# Patient Record
Sex: Female | Born: 1970 | Race: White | Hispanic: No | Marital: Married | Smoking: Never smoker
Health system: Southern US, Community
[De-identification: ages and names within clinical notes are randomized; demographics above are authoritative.]

## PROBLEM LIST (undated history)

## (undated) DIAGNOSIS — G4730 Sleep apnea, unspecified: Secondary | ICD-10-CM

## (undated) DIAGNOSIS — R232 Flushing: Secondary | ICD-10-CM

## (undated) DIAGNOSIS — Z923 Personal history of irradiation: Secondary | ICD-10-CM

## (undated) DIAGNOSIS — F419 Anxiety disorder, unspecified: Secondary | ICD-10-CM

## (undated) DIAGNOSIS — I10 Essential (primary) hypertension: Secondary | ICD-10-CM

## (undated) DIAGNOSIS — F32A Depression, unspecified: Secondary | ICD-10-CM

## (undated) DIAGNOSIS — K219 Gastro-esophageal reflux disease without esophagitis: Secondary | ICD-10-CM

## (undated) DIAGNOSIS — G4733 Obstructive sleep apnea (adult) (pediatric): Secondary | ICD-10-CM

## (undated) DIAGNOSIS — M1990 Unspecified osteoarthritis, unspecified site: Secondary | ICD-10-CM

## (undated) DIAGNOSIS — Z9989 Dependence on other enabling machines and devices: Secondary | ICD-10-CM

## (undated) DIAGNOSIS — C50919 Malignant neoplasm of unspecified site of unspecified female breast: Secondary | ICD-10-CM

## (undated) DIAGNOSIS — F329 Major depressive disorder, single episode, unspecified: Secondary | ICD-10-CM

## (undated) HISTORY — DX: Unspecified osteoarthritis, unspecified site: M19.90

## (undated) HISTORY — DX: Dependence on other enabling machines and devices: Z99.89

## (undated) HISTORY — PX: TONSILLECTOMY AND ADENOIDECTOMY: SHX28

## (undated) HISTORY — PX: CERVICAL BIOPSY  W/ LOOP ELECTRODE EXCISION: SUR135

## (undated) HISTORY — DX: Sleep apnea, unspecified: G47.30

## (undated) HISTORY — PX: UVULECTOMY: SHX2631

## (undated) HISTORY — PX: NASAL SEPTUM SURGERY: SHX37

## (undated) HISTORY — DX: Essential (primary) hypertension: I10

## (undated) HISTORY — DX: Flushing: R23.2

## (undated) HISTORY — DX: Obstructive sleep apnea (adult) (pediatric): G47.33

## (undated) HISTORY — DX: Gastro-esophageal reflux disease without esophagitis: K21.9

## (undated) HISTORY — PX: CHOLECYSTECTOMY: SHX55

## (undated) HISTORY — DX: Major depressive disorder, single episode, unspecified: F32.9

## (undated) HISTORY — DX: Depression, unspecified: F32.A

## (undated) HISTORY — DX: Malignant neoplasm of unspecified site of unspecified female breast: C50.919

## (undated) HISTORY — DX: Anxiety disorder, unspecified: F41.9

## (undated) HISTORY — PX: HERNIA REPAIR: SHX51

## (undated) HISTORY — PX: BREAST SURGERY: SHX581

---

## 2005-05-03 ENCOUNTER — Inpatient Hospital Stay: Payer: Self-pay

## 2007-01-15 ENCOUNTER — Ambulatory Visit: Payer: Self-pay | Admitting: Internal Medicine

## 2010-04-22 ENCOUNTER — Emergency Department: Payer: Self-pay | Admitting: Emergency Medicine

## 2010-04-23 ENCOUNTER — Emergency Department: Payer: Self-pay | Admitting: Unknown Physician Specialty

## 2010-05-23 ENCOUNTER — Emergency Department: Payer: Self-pay | Admitting: Emergency Medicine

## 2010-07-26 ENCOUNTER — Ambulatory Visit: Payer: Self-pay | Admitting: Unknown Physician Specialty

## 2010-07-26 DIAGNOSIS — I10 Essential (primary) hypertension: Secondary | ICD-10-CM

## 2010-07-29 ENCOUNTER — Ambulatory Visit: Payer: Self-pay | Admitting: Unknown Physician Specialty

## 2011-10-04 ENCOUNTER — Observation Stay: Payer: Self-pay | Admitting: Obstetrics and Gynecology

## 2011-10-11 ENCOUNTER — Observation Stay: Payer: Self-pay

## 2011-10-15 ENCOUNTER — Inpatient Hospital Stay: Payer: Self-pay | Admitting: Advanced Practice Midwife

## 2011-10-15 LAB — CBC WITH DIFFERENTIAL/PLATELET
Basophil #: 0.1 10*3/uL (ref 0.0–0.1)
Basophil %: 0.6 %
Eosinophil %: 1.7 %
HGB: 11.6 g/dL — ABNORMAL LOW (ref 12.0–16.0)
Lymphocyte #: 2.5 10*3/uL (ref 1.0–3.6)
Lymphocyte %: 21 %
MCH: 30.3 pg (ref 26.0–34.0)
MCV: 91 fL (ref 80–100)
Monocyte #: 0.9 x10 3/mm (ref 0.2–0.9)
Neutrophil #: 8.2 10*3/uL — ABNORMAL HIGH (ref 1.4–6.5)
RBC: 3.84 10*6/uL (ref 3.80–5.20)
RDW: 15.2 % — ABNORMAL HIGH (ref 11.5–14.5)

## 2012-04-26 ENCOUNTER — Other Ambulatory Visit: Payer: Self-pay | Admitting: Internal Medicine

## 2012-04-27 NOTE — Telephone Encounter (Signed)
She has not scheduled an appt with me.  Will need to sent request for refill to [facility] clinic.

## 2012-05-02 NOTE — Addendum Note (Signed)
Addended by: Marlene Lard on: 05/02/2012 05:17 PM   Modules accepted: Orders

## 2012-05-02 NOTE — Telephone Encounter (Signed)
Called patient to let her know. Left message on mahine

## 2013-02-18 ENCOUNTER — Ambulatory Visit: Payer: Self-pay | Admitting: Obstetrics and Gynecology

## 2013-03-04 ENCOUNTER — Ambulatory Visit: Payer: Self-pay | Admitting: Family Medicine

## 2013-05-05 ENCOUNTER — Ambulatory Visit: Payer: Self-pay | Admitting: Surgery

## 2013-05-05 LAB — CBC
HCT: 37.9 % (ref 35.0–47.0)
HGB: 12.5 g/dL (ref 12.0–16.0)
MCHC: 33 g/dL (ref 32.0–36.0)
MCV: 84 fL (ref 80–100)
RDW: 14.2 % (ref 11.5–14.5)

## 2013-05-05 LAB — BASIC METABOLIC PANEL
BUN: 15 mg/dL (ref 7–18)
Calcium, Total: 8.9 mg/dL (ref 8.5–10.1)
Chloride: 106 mmol/L (ref 98–107)
Co2: 28 mmol/L (ref 21–32)
Creatinine: 1.03 mg/dL (ref 0.60–1.30)
EGFR (African American): >60
EGFR (Non-African Amer.): >60
Potassium: 4 mmol/L (ref 3.5–5.1)
Sodium: 140 mmol/L (ref 136–145)

## 2013-08-25 ENCOUNTER — Emergency Department: Payer: Self-pay | Admitting: Emergency Medicine

## 2013-09-23 ENCOUNTER — Emergency Department: Payer: Self-pay | Admitting: Emergency Medicine

## 2013-09-23 LAB — CBC
HCT: 40.1 % (ref 35.0–47.0)
HGB: 13 g/dL (ref 12.0–16.0)
MCH: 27.2 pg (ref 26.0–34.0)
MCHC: 32.4 g/dL (ref 32.0–36.0)
MCV: 84 fL (ref 80–100)
Platelet: 279 10*3/uL (ref 150–440)
RBC: 4.77 10*6/uL (ref 3.80–5.20)
RDW: 14.5 % (ref 11.5–14.5)
WBC: 8.7 10*3/uL (ref 3.6–11.0)

## 2013-09-23 LAB — BASIC METABOLIC PANEL
ANION GAP: 5 — AB (ref 7–16)
BUN: 14 mg/dL (ref 7–18)
CO2: 27 mmol/L (ref 21–32)
Calcium, Total: 9.2 mg/dL (ref 8.5–10.1)
Chloride: 107 mmol/L (ref 98–107)
Creatinine: 1.16 mg/dL (ref 0.60–1.30)
EGFR (African American): >60
GFR CALC NON AF AMER: 58 — AB
Glucose: 102 mg/dL — ABNORMAL HIGH (ref 65–99)
Osmolality: 278 (ref 275–301)
Potassium: 3.8 mmol/L (ref 3.5–5.1)
SODIUM: 139 mmol/L (ref 136–145)

## 2013-09-23 LAB — TROPONIN I: Troponin-I: <0.02 ng/mL

## 2014-05-15 DIAGNOSIS — C50919 Malignant neoplasm of unspecified site of unspecified female breast: Secondary | ICD-10-CM

## 2014-05-15 HISTORY — DX: Malignant neoplasm of unspecified site of unspecified female breast: C50.919

## 2014-09-04 NOTE — Op Note (Signed)
PATIENT NAME:  [facility], [facility] MR#:  [id number] DATE OF BIRTH:  [birth year]  DATE OF PROCEDURE:  05/05/2013  PREOPERATIVE DIAGNOSIS: Umbilical hernia.   POSTOPERATIVE DIAGNOSIS: Umbilical hernia.   PROCEDURE: Umbilical hernia repair.   SURGEON: Rochel Brome, M.D.   ANESTHESIA: General.   INDICATION: She reports now some 9 month history of pain in the supraumbilical area. Hurts most every day. On physical examination, was very obese. The abdomen was soft and nontender, but did have a localized area of tenderness deep to the umbilicus. This was also examined with CT scan demonstrating an umbilical hernia, which is approximately a 1 cm defect, and the bulge extended up some 2 to 3 cm, and due to the pain, repair was recommended for definitive treatment.   The patient was placed on the operating table in the supine position under general anesthesia. The abdomen and umbilicus were prepared with ChloraPrep and draped in a sterile manner.   A supraumbilical, transversely-oriented curvilinear incision was made some 4 cm in length, carried down through subcutaneous tissues, and could see that the skin of the umbilicus was approximately 1.8 cm deep and dissected down beyond this finding umbilical hernia sac, which was dissected free from surrounding structures. It was transected deep to the level of the skin, was followed down to the deep fascia, and the fascial ring defect was dissected, so that the sac was dissected away from the fascial ring defect. The sac was inverted. There was good integrity of the fascial ring defect, and the defect itself was approximately 8 mm in dimension. The fascia was somewhat thick. It appeared that it would hold sutures well, and the hernia was repaired with a running 0 Surgilon suture. Next, the wound was inspected. Hemostasis was intact. The fatty tissues were closed with a 3-0 chromic pursestring suture, and then this was attached to the deep portion of the skin of the  umbilicus to maintain its inversion. Next, the skin was closed with running 4-0 Monocryl subcuticular suture and Dermabond. The patient tolerated surgery satisfactorily and was then prepared for transfer to the recovery room.     ____________________________ Lenna Sciara. Rochel Brome, MD jws:dmm D: 05/05/2013 08:28:10 ET T: 05/05/2013 09:59:44 ET JOB#: [id number]  cc: Loreli Dollar, MD, <Dictator> Loreli Dollar MD ELECTRONICALLY SIGNED 05/07/2013 17:54

## 2014-09-22 NOTE — H&P (Signed)
L&D Evaluation:  History Expanded:   HPI 44 yo G3P2002 whose EDC = 10/14/11.  Pt followed at [facility] fore this pregnancy.  Pt presents with contractions    Gravida 3    Term 2    PreTerm 0    Abortion 0    Living 2    [facility] 14-Oct-2011    Presents with contractions    Patient's Medical History Reflux    Patient's Surgical History Colecystectomy  LEEP    Medications Pre Natal Vitamins  Protonics    Allergies NKDA    Social History none   Exam:   General no apparent distress    Mental Status clear    Chest clear    Heart normal sinus rhythm    Abdomen gravid, non-tender    Estimated Fetal Weight ? (obese)    Fetal Position Vtx on ultrasound    Pelvic 1/90/-?    FHT normal rate with no decels   Impression:   Impression Will observe for now, allow to go home if there is no change   Electronic Signatures: Rosina Lowenstein (MD)  (Signed 22-May-13 02:05)  Authored: L&D Evaluation   Last Updated: 22-May-13 02:05 by Rosina Lowenstein (MD)

## 2014-09-22 NOTE — H&P (Signed)
L&D Evaluation:  History Expanded:   HPI 44 yo G3P2002 whose EDC = 10/14/11. Presents for IOL at 40 weeks for BMI 41. PNC at [facility] notable for early entry to care, h/o LEEP with normal cervical length [place],  AMA with negative 1st trimester screening and AFP, Obesity with BMI 41 with normal AFI and growth scans.    Gravida 3    Term 2    PreTerm 0    Abortion 0    Living 2    Blood Type O positive    Group B Strep Results (Result >5wks must be treated as unknown) positive    Maternal HIV Negative    Maternal Syphilis Ab Nonreactive    Maternal Varicella Immune    Rubella Results immune    EDC 14-Oct-2011    Presents with contractions    Patient's Medical History Hypertension  HGSIL    Patient's Surgical History Colecystectomy  LEEP  septoplasty, tonsillectomy    Medications Pre Natal Vitamins  Protonix    Allergies NKDA    Social History none   ROS:   ROS All systems were reviewed.  HEENT, CNS, GI, GU, Respiratory, CV, Renal and Musculoskeletal systems were found to be normal.   Exam:   Vital Signs 132/64 at office on 5/29    General no apparent distress    Mental Status clear    Abdomen gravid, non-tender    Pelvic 1/50/-3 on 5/29    Mebranes Intact    FHT normal rate with no decels   Impression:   Impression IOL for obesity, 40 weeks   Plan:   Plan antibiotics for GBBS prophylaxis    Comments Cervidil Pitocin in am   Electronic Signatures: Ander Purpura (CNM)  (Signed 02-Jun-13 19:55)  Authored: L&D Evaluation   Last Updated: 02-Jun-13 19:55 by Ander Purpura (CNM)

## 2015-01-29 ENCOUNTER — Telehealth: Payer: Self-pay | Admitting: Family Medicine

## 2015-01-29 NOTE — Telephone Encounter (Signed)
Patient was informed and was scheduled for 02/03/15 @ 8:15am.

## 2015-01-29 NOTE — Telephone Encounter (Signed)
Tried to contact this patient to review Dr. Allie Dimmer message but there was answer. A message was left for this patient to give [place] a call when she got the chance.

## 2015-01-29 NOTE — Telephone Encounter (Signed)
Patient returned your call [phone]

## 2015-01-29 NOTE — Telephone Encounter (Signed)
Please communicate with Elizabeth Hoffman that I just spoke with her OB/GYN specialist after her visit with him today (PAP test done today) he has recommended consultation with me as a CPE visit for mammogram order, blood work, and referral to [place] facility to discuss hysterectomy for her ongoing menstrual issues.  Make appointment at her convenience.

## 2015-02-03 ENCOUNTER — Encounter: Payer: Self-pay | Admitting: Family Medicine

## 2015-02-03 ENCOUNTER — Ambulatory Visit (INDEPENDENT_AMBULATORY_CARE_PROVIDER_SITE_OTHER): Payer: Medicaid Other | Admitting: Family Medicine

## 2015-02-03 VITALS — BP 122/78 | HR 89 | Temp 98.7°F | Resp 18 | Ht 70.0 in | Wt 350.6 lb

## 2015-02-03 DIAGNOSIS — Z1231 Encounter for screening mammogram for malignant neoplasm of breast: Secondary | ICD-10-CM | POA: Diagnosis not present

## 2015-02-03 DIAGNOSIS — N921 Excessive and frequent menstruation with irregular cycle: Secondary | ICD-10-CM

## 2015-02-03 DIAGNOSIS — Z13220 Encounter for screening for lipoid disorders: Secondary | ICD-10-CM

## 2015-02-03 DIAGNOSIS — M654 Radial styloid tenosynovitis [de Quervain]: Secondary | ICD-10-CM | POA: Insufficient documentation

## 2015-02-03 DIAGNOSIS — Z136 Encounter for screening for cardiovascular disorders: Secondary | ICD-10-CM

## 2015-02-03 DIAGNOSIS — D229 Melanocytic nevi, unspecified: Secondary | ICD-10-CM | POA: Insufficient documentation

## 2015-02-03 DIAGNOSIS — Z6841 Body Mass Index (BMI) 40.0 and over, adult: Secondary | ICD-10-CM

## 2015-02-03 DIAGNOSIS — I10 Essential (primary) hypertension: Secondary | ICD-10-CM | POA: Diagnosis not present

## 2015-02-03 DIAGNOSIS — K219 Gastro-esophageal reflux disease without esophagitis: Secondary | ICD-10-CM | POA: Insufficient documentation

## 2015-02-03 DIAGNOSIS — F32A Depression, unspecified: Secondary | ICD-10-CM | POA: Insufficient documentation

## 2015-02-03 DIAGNOSIS — Z9889 Other specified postprocedural states: Secondary | ICD-10-CM | POA: Diagnosis not present

## 2015-02-03 DIAGNOSIS — F3342 Major depressive disorder, recurrent, in full remission: Secondary | ICD-10-CM

## 2015-02-03 DIAGNOSIS — R7301 Impaired fasting glucose: Secondary | ICD-10-CM

## 2015-02-03 DIAGNOSIS — R7309 Other abnormal glucose: Secondary | ICD-10-CM | POA: Diagnosis not present

## 2015-02-03 DIAGNOSIS — R7303 Prediabetes: Secondary | ICD-10-CM

## 2015-02-03 DIAGNOSIS — IMO0001 Reserved for inherently not codable concepts without codable children: Secondary | ICD-10-CM

## 2015-02-03 DIAGNOSIS — F329 Major depressive disorder, single episode, unspecified: Secondary | ICD-10-CM | POA: Insufficient documentation

## 2015-02-03 NOTE — Progress Notes (Signed)
Name: Elizabeth Hoffman   MRN: [id number]    DOB: [birth year]   Date:02/03/2015       Progress Note  Subjective  Chief Complaint  Chief Complaint  Patient presents with  . Referral    patient needs a referral for mammogram, and to [facility] in [place]    HPI  Elizabeth Hoffman is a 44 year old female with ongoing concerns regarding heavy and irregular menses not associated with fibroid uterus. She has been working with her gynecologist [facility] Glennon [facility] at [facility] and has decided on proceeding with a hysterectomy however I spoke with Dr. Glennon Hoffman on 01/29/15 after her visit with him that same day (PAP test done) and he has recommended consultation with me for mammogram order, blood work, and referral to [facility] facility to discuss hysterectomy for her ongoing menstrual issues.She reports today she has not heard from his office regarding PAP test results. She has had atypical pre-cancerous cervical cells and had a LEEP procedure in 2012. She also notes bilateral breast achy pain for a few days without warmth, redness, discharge. Not sure if it is stress and hormone related. Denies pregnancy. Overall she feels that her depression is well controled but her recent experience with her gynecologist has her worried that there may be something wrong.    First diagnosed with hypertension several years ago. Current anti-hypertension medication regimen includes dietary modification, weight management and Amlodipine 5 mg a day, HCTZ 25 mg a day. Patient is following physician recommended management other than no weight loss efforts. Not checking blood pressure outside of physician office. Associated symptoms do not include headache, dizziness, nausea, lower extremity swelling, shortness of breath, chest pain, numbness.    Past Medical History  Diagnosis Date  . Arthritis   . Depression   . Hypertension     Past Surgical History  Procedure Laterality Date  . Tonsillectomy and  adenoidectomy    . Uvulectomy    . Cholecystectomy    . Hernia repair    . Cervical biopsy  w/ loop electrode excision    . Nasal septum surgery      Family History  Problem Relation Age of Onset  . Hyperlipidemia Mother   . Hypertension Mother   . Hyperlipidemia Father   . Hypertension Father     Social History   Social History  . Marital Status: Married    Spouse Name: N/A  . Number of Children: N/A  . Years of Education: N/A   Occupational History  . Not on file.   Social History Main Topics  . Smoking status: Never Smoker   . Smokeless tobacco: Not on file  . Alcohol Use: No  . Drug Use: No  . Sexual Activity:    Partners: Male   Other Topics Concern  . Not on file   Social History Narrative  . No narrative on file     Current outpatient prescriptions:  .  amLODipine (NORVASC) 5 MG tablet, Take 5 mg by mouth daily., Disp: , Rfl:  .  hydrochlorothiazide (HYDRODIURIL) 25 MG tablet, Take 25 mg by mouth daily., Disp: , Rfl:  .  pantoprazole (PROTONIX) 40 MG tablet, Take 40 mg by mouth daily., Disp: , Rfl:  .  sertraline (ZOLOFT) 100 MG tablet, Take 100 mg by mouth daily., Disp: , Rfl:   No Known Allergies   ROS  CONSTITUTIONAL: No significant weight changes, fever, chills, weakness or fatigue.  HEENT:  - Eyes: No visual changes.  -  Ears: No auditory changes. No pain.  - Nose: No sneezing, congestion, runny nose. - Throat: No sore throat. No changes in swallowing. SKIN: No rash or itching.  CARDIOVASCULAR: No chest pain, chest pressure or chest discomfort. No palpitations or edema.  RESPIRATORY: No shortness of breath, cough or sputum.  ABDOMINAL: No nausea, vomiting, pain, change in bowel habits. GENITOURINARY: No discharge, no pelvic pain. Yes heavy irregular menses. NEUROLOGICAL: No headache, dizziness, syncope, paralysis, ataxia, numbness or tingling in the extremities. No memory changes. No change in bowel or bladder control.  MUSCULOSKELETAL: No  joint pain. No muscle pain. PSYCH: No changes in mood. ENDOCRINOLOGIC: No reports of sweating, cold or heat intolerance. No polyuria or polydipsia.   Objective  Filed Vitals:   02/03/15 0842  BP: 122/78  Pulse: 89  Temp: 98.7 F (37.1 C)  TempSrc: Oral  Resp: 18  Height: 5' 10"  (1.778 m)  Weight: 350 lb 9.6 oz (159.031 kg)  SpO2: 95%   Body mass index is 50.31 kg/(m^2).  Physical Exam  Constitutional: Patient is morbidly obese and well-nourished. In no distress.  HEENT:  - Head: Normocephalic and atraumatic.  - Ears: Bilateral TMs gray, no erythema or effusion - Nose: Nasal mucosa moist - Mouth/Throat: Oropharynx is clear and moist. No tonsillar hypertrophy or erythema. No post nasal drainage.  - Eyes: Conjunctivae clear, EOM movements normal. PERRLA. No scleral icterus.  Neck: Normal range of motion. Neck supple. No JVD present. No thyromegaly present.  Cardiovascular: Normal rate, regular rhythm and normal heart sounds.  No murmur heard.  Pulmonary/Chest: Effort normal and breath sounds normal. No respiratory distress. Abdomen: Soft, obese, no suprapubic tenderness, normal bowel sounds in all four quadrants.  Musculoskeletal: Normal range of motion bilateral UE and LE, no joint effusions. Peripheral vascular: Bilateral LE no edema. Psychiatric: Patient has an anxious mood and affect. Behavior is normal in office today. Judgment and thought content normal in office today.   Assessment & Plan  1. Menorrhagia with irregular cycle My nurse called over to [facility] and they have stated they do not have PAP testing results from 01/29/15 ready as of yet. Ongoing issue of heavy irregular menses with history of abnormal PAP s/p LEEP for which patient is ready to proceed with Hysterectomy. Referral to [facility] has been placed. Dr. Curly [facility] note from [facility] pending and her records from that office should be sent to the [facility] office but she can  fill out release of records on her first visit I think.  - TSH - Ambulatory referral to Gynecology  2. Hypertension goal BP (blood pressure) < 140/90 Clinically stable findings based on clinical exam and on review of any pertinent results, although she is due for updated labs. Recommended to patient that they continue their current regimen with regular follow ups.  - CBC with Differential/Platelet - Comprehensive metabolic panel - Lipid panel - TSH - Hemoglobin A1c  3. Morbid obesity with BMI of 40.0-44.9, adult The patient has been counseled on their higher than normal BMI.  They have verbally expressed understanding their increased risk for other diseases.  In efforts to meet a better target BMI goal the patient has been counseled on lifestyle, diet and exercise modification tactics. Start with moderate intensity aerobic exercise (walking, jogging, elliptical, swimming, group or individual sports, hiking) at least 57mns a day at least 4 days a week and increase intensity, duration, frequency as tolerated. Diet should include well balance fresh fruits and vegetables avoiding processed foods,  carbohydrates and sugars. Drink at least 8oz 10 glasses a day avoiding sodas, sugary fruit drinks, sweetened tea. Check weight on a reliable scale daily and monitor weight loss progress daily. Consider investing in mobile phone apps that will help keep track of weight loss goals.  - Lipid panel - Hemoglobin A1c  4. History of loop electrosurgical excision procedure (LEEP) of cervix  - Ambulatory referral to Gynecology  5. Encounter for screening mammogram for malignant neoplasm of breast  - MM Digital Screening; Future  6. Major depressive disorder, recurrent, in full remission Stable, continue current medications.   7. Pre-diabetes Weight loss would reduce risks for progression to overt DM II.  - Lipid panel - Hemoglobin A1c  8. Elevated fasting glucose  - Hemoglobin A1c  9. Encounter  for cholesteral screening for cardiovascular disease  - Lipid panel

## 2015-02-04 ENCOUNTER — Telehealth: Payer: Self-pay

## 2015-02-04 LAB — CBC WITH DIFFERENTIAL/PLATELET
BASOS ABS: 0 10*3/uL (ref 0.0–0.2)
BASOS: 0 %
EOS (ABSOLUTE): 0.4 10*3/uL (ref 0.0–0.4)
Eos: 4 %
HEMOGLOBIN: 12.8 g/dL (ref 11.1–15.9)
Hematocrit: 38.6 % (ref 34.0–46.6)
IMMATURE GRANS (ABS): 0 10*3/uL (ref 0.0–0.1)
Immature Granulocytes: 0 %
LYMPHS: 31 %
Lymphocytes Absolute: 2.9 10*3/uL (ref 0.7–3.1)
MCH: 27.3 pg (ref 26.6–33.0)
MCHC: 33.2 g/dL (ref 31.5–35.7)
MCV: 82 fL (ref 79–97)
MONOCYTES: 6 %
Monocytes Absolute: 0.6 10*3/uL (ref 0.1–0.9)
NEUTROS ABS: 5.6 10*3/uL (ref 1.4–7.0)
Neutrophils: 59 %
Platelets: 304 10*3/uL (ref 150–379)
RBC: 4.69 x10E6/uL (ref 3.77–5.28)
RDW: 14.5 % (ref 12.3–15.4)
WBC: 9.5 10*3/uL (ref 3.4–10.8)

## 2015-02-04 NOTE — Telephone Encounter (Signed)
Patient was informed that her PAP results from [employer] was negative.

## 2015-02-05 ENCOUNTER — Encounter: Payer: Self-pay | Admitting: Family Medicine

## 2015-02-05 LAB — COMPREHENSIVE METABOLIC PANEL
ALK PHOS: 72 IU/L (ref 39–117)
ALT: 26 IU/L (ref 0–32)
AST: 25 IU/L (ref 0–40)
Albumin/Globulin Ratio: 1.3 (ref 1.1–2.5)
Albumin: 4.3 g/dL (ref 3.5–5.5)
BUN/Creatinine Ratio: 14 (ref 9–23)
BUN: 13 mg/dL (ref 6–24)
Bilirubin Total: 0.3 mg/dL (ref 0.0–1.2)
CHLORIDE: 98 mmol/L (ref 97–108)
CO2: 25 mmol/L (ref 18–29)
CREATININE: 0.96 mg/dL (ref 0.57–1.00)
Calcium: 9.4 mg/dL (ref 8.7–10.2)
GFR calc Af Amer: 84 mL/min/{1.73_m2} (ref >59)
GFR calc non Af Amer: 73 mL/min/{1.73_m2} (ref >59)
GLUCOSE: 97 mg/dL (ref 65–99)
Globulin, Total: 3.2 g/dL (ref 1.5–4.5)
Potassium: 4.2 mmol/L (ref 3.5–5.2)
Sodium: 141 mmol/L (ref 134–144)
Total Protein: 7.5 g/dL (ref 6.0–8.5)

## 2015-02-05 LAB — HEMOGLOBIN A1C
Est. average glucose Bld gHb Est-mCnc: 131 mg/dL
Hgb A1c MFr Bld: 6.2 % — ABNORMAL HIGH (ref 4.8–5.6)

## 2015-02-05 LAB — LIPID PANEL
CHOL/HDL RATIO: 3.7 ratio (ref 0.0–4.4)
CHOLESTEROL TOTAL: 202 mg/dL — AB (ref 100–199)
HDL: 55 mg/dL (ref >39)
LDL CALC: 110 mg/dL — AB (ref 0–99)
TRIGLYCERIDES: 184 mg/dL — AB (ref 0–149)

## 2015-02-05 LAB — TSH: TSH: 2.64 u[IU]/mL (ref 0.450–4.500)

## 2015-02-08 ENCOUNTER — Encounter: Payer: Self-pay | Admitting: Family Medicine

## 2015-02-08 DIAGNOSIS — E785 Hyperlipidemia, unspecified: Secondary | ICD-10-CM | POA: Insufficient documentation

## 2015-02-08 DIAGNOSIS — E782 Mixed hyperlipidemia: Secondary | ICD-10-CM | POA: Insufficient documentation

## 2015-02-09 ENCOUNTER — Ambulatory Visit
Admission: RE | Admit: 2015-02-09 | Discharge: 2015-02-09 | Disposition: A | Discharge status: Home / Self Care | Payer: Medicaid Other | Source: Ambulatory Visit | Attending: Family Medicine | Admitting: Family Medicine

## 2015-02-09 DIAGNOSIS — Z1231 Encounter for screening mammogram for malignant neoplasm of breast: Secondary | ICD-10-CM

## 2015-02-09 DIAGNOSIS — R921 Mammographic calcification found on diagnostic imaging of breast: Secondary | ICD-10-CM | POA: Insufficient documentation

## 2015-02-10 ENCOUNTER — Encounter: Payer: Self-pay | Admitting: Family Medicine

## 2015-02-11 ENCOUNTER — Telehealth: Payer: Self-pay | Admitting: Family Medicine

## 2015-02-11 NOTE — Telephone Encounter (Signed)
Patient states that [facility] has faxed over a form for Dr [name] to sign and return. Patient is asking that you fax the forms back today if possible

## 2015-02-11 NOTE — Telephone Encounter (Signed)
Signed and ready to be faxed back

## 2015-02-12 ENCOUNTER — Other Ambulatory Visit: Payer: Self-pay | Admitting: Family Medicine

## 2015-02-12 DIAGNOSIS — R921 Mammographic calcification found on diagnostic imaging of breast: Secondary | ICD-10-CM

## 2015-02-15 ENCOUNTER — Ambulatory Visit
Admission: RE | Admit: 2015-02-15 | Discharge: 2015-02-15 | Disposition: A | Discharge status: Home / Self Care | Payer: Medicaid Other | Source: Ambulatory Visit | Attending: Family Medicine | Admitting: Family Medicine

## 2015-02-15 DIAGNOSIS — R921 Mammographic calcification found on diagnostic imaging of breast: Secondary | ICD-10-CM | POA: Diagnosis present

## 2015-02-15 DIAGNOSIS — C50412 Malignant neoplasm of upper-outer quadrant of left female breast: Secondary | ICD-10-CM

## 2015-02-15 HISTORY — PX: MASTECTOMY, PARTIAL: SHX709

## 2015-02-16 ENCOUNTER — Other Ambulatory Visit: Payer: Self-pay | Admitting: Family Medicine

## 2015-02-16 DIAGNOSIS — R921 Mammographic calcification found on diagnostic imaging of breast: Secondary | ICD-10-CM

## 2015-02-17 ENCOUNTER — Other Ambulatory Visit: Payer: Self-pay | Admitting: Surgery

## 2015-02-17 ENCOUNTER — Ambulatory Visit: Payer: Medicaid Other

## 2015-02-17 ENCOUNTER — Other Ambulatory Visit: Payer: Self-pay | Admitting: Family Medicine

## 2015-02-17 ENCOUNTER — Ambulatory Visit
Admission: RE | Admit: 2015-02-17 | Discharge: 2015-02-17 | Disposition: A | Discharge status: Home / Self Care | Payer: Medicaid Other | Source: Ambulatory Visit | Attending: Family Medicine | Admitting: Family Medicine

## 2015-02-17 DIAGNOSIS — C50912 Malignant neoplasm of unspecified site of left female breast: Secondary | ICD-10-CM

## 2015-02-17 DIAGNOSIS — R921 Mammographic calcification found on diagnostic imaging of breast: Secondary | ICD-10-CM

## 2015-02-17 DIAGNOSIS — N6320 Unspecified lump in the left breast, unspecified quadrant: Secondary | ICD-10-CM

## 2015-02-18 ENCOUNTER — Encounter: Payer: Self-pay | Admitting: Family Medicine

## 2015-02-18 ENCOUNTER — Other Ambulatory Visit: Payer: Self-pay | Admitting: Diagnostic Radiology

## 2015-02-18 ENCOUNTER — Ambulatory Visit
Admission: RE | Admit: 2015-02-18 | Discharge: 2015-02-18 | Disposition: A | Discharge status: Home / Self Care | Payer: Medicaid Other | Source: Ambulatory Visit | Attending: Family Medicine | Admitting: Family Medicine

## 2015-02-18 ENCOUNTER — Other Ambulatory Visit (HOSPITAL_COMMUNITY): Payer: Self-pay | Admitting: Diagnostic Radiology

## 2015-02-18 DIAGNOSIS — R921 Mammographic calcification found on diagnostic imaging of breast: Secondary | ICD-10-CM

## 2015-02-22 ENCOUNTER — Telehealth: Payer: Self-pay | Admitting: *Deleted

## 2015-02-22 ENCOUNTER — Telehealth: Payer: Self-pay | Admitting: Family Medicine

## 2015-02-22 DIAGNOSIS — C50412 Malignant neoplasm of upper-outer quadrant of left female breast: Secondary | ICD-10-CM | POA: Insufficient documentation

## 2015-02-22 DIAGNOSIS — Z170 Estrogen receptor positive status [ER+]: Secondary | ICD-10-CM | POA: Insufficient documentation

## 2015-02-22 NOTE — Telephone Encounter (Signed)
Requesting a referral to be sent to a surgeon at [facility]. Found out this past Friday that she has breast cancer.

## 2015-02-22 NOTE — Telephone Encounter (Signed)
Received call back from patient. Confirmed [facility] for 02/24/15 at 1230pm.  Instructions and contact information given.

## 2015-02-22 NOTE — Telephone Encounter (Signed)
Left message for a return phone call to schedule for [facility]. Awaiting patient response.

## 2015-02-23 NOTE — Telephone Encounter (Signed)
Please let patient know I have been following along with her breast cancer diagnosis. My impression was that she was referred to a breast cancer team in [place]. But she is requesting a breast cancer surgical consultation at [facility]. I am not familiar with the providers at [facility], can she provide me with the name of a particular office or surgeon she would like to be referred to?

## 2015-02-23 NOTE — Telephone Encounter (Signed)
Yes that sounds good.

## 2015-02-23 NOTE — Telephone Encounter (Signed)
Pt stated that she has a 4 hour appt tomorrow at a breast clinic in [facility] tomorrow and she will see if they will make the referral depending upon what they find. I asked her to keep [place] aware.

## 2015-02-24 ENCOUNTER — Ambulatory Visit (HOSPITAL_BASED_OUTPATIENT_CLINIC_OR_DEPARTMENT_OTHER): Payer: Medicaid Other | Admitting: Genetic Counselor

## 2015-02-24 ENCOUNTER — Ambulatory Visit: Payer: Medicaid Other | Admitting: Physical Therapy

## 2015-02-24 ENCOUNTER — Ambulatory Visit
Admission: RE | Admit: 2015-02-24 | Discharge: 2015-02-24 | Disposition: A | Discharge status: Home / Self Care | Payer: Medicaid Other | Source: Ambulatory Visit | Attending: Radiation Oncology | Admitting: Radiation Oncology

## 2015-02-24 ENCOUNTER — Encounter: Payer: Self-pay | Admitting: Nurse Practitioner

## 2015-02-24 ENCOUNTER — Encounter: Payer: Self-pay | Admitting: Radiation Oncology

## 2015-02-24 ENCOUNTER — Encounter: Payer: Self-pay | Admitting: Genetic Counselor

## 2015-02-24 ENCOUNTER — Ambulatory Visit (HOSPITAL_BASED_OUTPATIENT_CLINIC_OR_DEPARTMENT_OTHER): Payer: Medicaid Other | Admitting: Oncology

## 2015-02-24 ENCOUNTER — Ambulatory Visit: Payer: Self-pay | Admitting: Surgery

## 2015-02-24 ENCOUNTER — Ambulatory Visit: Payer: Medicaid Other

## 2015-02-24 ENCOUNTER — Encounter: Payer: Self-pay | Admitting: Oncology

## 2015-02-24 ENCOUNTER — Encounter: Payer: Self-pay | Admitting: General Practice

## 2015-02-24 ENCOUNTER — Other Ambulatory Visit (HOSPITAL_BASED_OUTPATIENT_CLINIC_OR_DEPARTMENT_OTHER): Payer: Medicaid Other

## 2015-02-24 VITALS — BP 130/61 | HR 70 | Temp 97.9°F | Resp 18 | Ht 70.0 in | Wt 349.6 lb

## 2015-02-24 DIAGNOSIS — D0512 Intraductal carcinoma in situ of left breast: Secondary | ICD-10-CM

## 2015-02-24 DIAGNOSIS — C50412 Malignant neoplasm of upper-outer quadrant of left female breast: Secondary | ICD-10-CM

## 2015-02-24 DIAGNOSIS — Z315 Encounter for genetic counseling: Secondary | ICD-10-CM

## 2015-02-24 DIAGNOSIS — C50919 Malignant neoplasm of unspecified site of unspecified female breast: Secondary | ICD-10-CM | POA: Insufficient documentation

## 2015-02-24 DIAGNOSIS — Z170 Estrogen receptor positive status [ER+]: Secondary | ICD-10-CM | POA: Diagnosis not present

## 2015-02-24 LAB — CBC WITH DIFFERENTIAL/PLATELET
BASO%: 0.4 % (ref 0.0–2.0)
Basophils Absolute: 0 10*3/uL (ref 0.0–0.1)
EOS%: 8.2 % — ABNORMAL HIGH (ref 0.0–7.0)
Eosinophils Absolute: 0.7 10*3/uL — ABNORMAL HIGH (ref 0.0–0.5)
HEMATOCRIT: 40.1 % (ref 34.8–46.6)
HEMOGLOBIN: 12.8 g/dL (ref 11.6–15.9)
LYMPH#: 3 10*3/uL (ref 0.9–3.3)
LYMPH%: 33.6 % (ref 14.0–49.7)
MCH: 27.1 pg (ref 25.1–34.0)
MCHC: 31.9 g/dL (ref 31.5–36.0)
MCV: 85 fL (ref 79.5–101.0)
MONO#: 0.6 10*3/uL (ref 0.1–0.9)
MONO%: 7.1 % (ref 0.0–14.0)
NEUT%: 50.7 % (ref 38.4–76.8)
NEUTROS ABS: 4.5 10*3/uL (ref 1.5–6.5)
Platelets: 252 10*3/uL (ref 145–400)
RBC: 4.72 10*6/uL (ref 3.70–5.45)
RDW: 15 % — ABNORMAL HIGH (ref 11.2–14.5)
WBC: 8.9 10*3/uL (ref 3.9–10.3)
nRBC: 0 % (ref 0–0)

## 2015-02-24 LAB — COMPREHENSIVE METABOLIC PANEL (CC13)
ALBUMIN: 3.7 g/dL (ref 3.5–5.0)
ALK PHOS: 77 U/L (ref 40–150)
ALT: 32 U/L (ref 0–55)
AST: 29 U/L (ref 5–34)
Anion Gap: 9 mEq/L (ref 3–11)
BILIRUBIN TOTAL: 0.3 mg/dL (ref 0.20–1.20)
BUN: 10.9 mg/dL (ref 7.0–26.0)
CALCIUM: 9.5 mg/dL (ref 8.4–10.4)
CO2: 25 mEq/L (ref 22–29)
CREATININE: 0.9 mg/dL (ref 0.6–1.1)
Chloride: 106 mEq/L (ref 98–109)
EGFR: 77 mL/min/{1.73_m2} — ABNORMAL LOW (ref >90)
Glucose: 114 mg/dl (ref 70–140)
Potassium: 3.7 mEq/L (ref 3.5–5.1)
Sodium: 141 mEq/L (ref 136–145)
TOTAL PROTEIN: 7.5 g/dL (ref 6.4–8.3)

## 2015-02-24 NOTE — Progress Notes (Signed)
[place] Psychosocial Distress Screening Clinical Social Work  Met with [name] and best friend Joseph Art at [facility] to introduce [place] team/resources, reviewing distress screen per protocol.  Accompanied by Counseling Intern Vaughan Sine.  The patient scored a 3 on the Psychosocial Distress Thermometer which indicates mild distress. Also assessed for distress and other psychosocial needs.   ONCBCN DISTRESS SCREENING 02/24/2015  Screening Type Initial Screening  Distress experienced in past week (1-10) 3  Practical problem type Childcare  Family Problem type Children  Emotional problem type Nervousness/Anxiety  Information Concerns Type Lack of info about diagnosis  Physical Problem type Sleep/insomnia  Referral to support programs Yes  Other Spiritual Care, Counseling Intern   [place] was relieved to learn that, of breast ca diagnoses, her prognosis/tx plan is "best-case scenario."  She reports resolution of information concerns and a current distress level of 1.  She describes anxiety as somewhat normal for her, but elevated upon learning of dx.  Now that she knows plan is lumpectomy and radiation, her childcare concerns are resolved.  (Children are 16, 9, and 3yo.)  She was in good spirits and reports support from family/friends.    Follow up needed: No.  Pt aware of ongoing Support Team availability and programming, as well as contact information in info packet.  Please also page as needs arise.  Thank you.  [place], [place], [facility] Pager [phone] Voicemail  [phone]

## 2015-02-24 NOTE — Progress Notes (Signed)
Radiation Oncology         [phone] ________________________________  Name: Elizabeth Hoffman MRN: [id number]  Date: 02/24/2015  DOB: [birth year]  JJ:[id number] [name], MD  Erroll Luna, MD     REFERRING PHYSICIAN: Erroll Luna, MD   DIAGNOSIS: The encounter diagnosis was Breast cancer of upper-outer quadrant of left female breast ([place]).   HISTORY OF PRESENT ILLNESS::Elizabeth Hoffman is a 44 y.o. 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  The patient presented was originally with suspicious calcifications on screening mammogram. A diagnostic mammogram confirmed this finding and a stereotactic biopsy of this area was performed.  This returned positive for DCIS. Mass is 3.1 cm and is located in the upper outer quadrant. Receptors studies have been performed and the tumor is estrogen receptor positive and progesterone receptor positive.  The patient has not undergone an MRI scan of the breasts.  The patient states she is in overall good health during today's visit.   PREVIOUS RADIATION THERAPY: No   PAST MEDICAL HISTORY:  has a past medical history of Arthritis; Depression; Hypertension; Anxiety; Hot flashes; and Breast cancer ([place]) (2016).     PAST SURGICAL HISTORY: Past Surgical History  Procedure Laterality Date  . Tonsillectomy and adenoidectomy    . Uvulectomy    . Cholecystectomy    . Hernia repair    . Cervical biopsy  w/ loop electrode excision    . Nasal septum surgery       FAMILY HISTORY: family history includes Hyperlipidemia in her father and mother; Hypertension in her father and mother. She was adopted.   SOCIAL HISTORY:  reports that she has never smoked. She does not have any smokeless tobacco history on file. She reports that she does not drink alcohol or use illicit drugs.   ALLERGIES: Review of patient's allergies indicates no known allergies.   MEDICATIONS:  Current  Outpatient Prescriptions  Medication Sig Dispense Refill  . amLODipine (NORVASC) 5 MG tablet Take 5 mg by mouth daily.    . hydrochlorothiazide (HYDRODIURIL) 25 MG tablet Take 25 mg by mouth daily.    . pantoprazole (PROTONIX) 40 MG tablet 1 (ONE) TABLET DR, ORAL, DAILY 30 tablet 4  . sertraline (ZOLOFT) 100 MG tablet TAKE 1 (ONE) TABLET, ORAL, DAILY 30 tablet 4   No current facility-administered medications for this encounter.     REVIEW OF SYSTEMS:  A 15 point review of systems is documented in the electronic medical record. This was obtained by the nursing staff. However, I reviewed this with the patient to discuss relevant findings and make appropriate changes.  Pertinent items are noted in HPI.    PHYSICAL EXAM:  Vitals with Age-Percentiles 02/24/2015  Length 063.0 cm  Systolic 160  Diastolic 61  Pulse 70  Respiration 18  Weight 158.578 kg  BMI 50.3    ECOG = 0  0 - Asymptomatic (Fully active, able to carry on all predisease activities without restriction)  General: Well-developed, in no acute distress HEENT: Normocephalic, atraumatic; oral cavity clear Neck: Supple without any lymphadenopathy Cardiovascular: Regular rate and rhythm Respiratory: Clear to auscultation bilaterally Breasts: Biopsy site laterally in the left breast without any palpable abnormality. No axillary lymphadenopathy. GI: Soft, nontender, normal bowel sounds Extremities: No edema present Neuro: No focal deficits  LABORATORY DATA:  Lab Results  Component Value Date   WBC 8.9 02/24/2015   HGB 12.8 02/24/2015   HCT 40.1 02/24/2015  MCV 85.0 02/24/2015   PLT 252 02/24/2015   Lab Results  Component Value Date   NA 141 02/24/2015   K 3.7 02/24/2015   CL 98 02/04/2015   CO2 25 02/24/2015   Lab Results  Component Value Date   ALT 32 02/24/2015   AST 29 02/24/2015   ALKPHOS 77 02/24/2015   BILITOT 0.30 02/24/2015      RADIOGRAPHY: Mm Digital Diagnostic Unilat L  02/22/2015  ADDENDUM  REPORT: 02/22/2015 15:23 ADDENDUM: The calcifications span an area measuring 3.1 x 2.0 x 0.9 cm in maximum dimensions. Electronically Signed   By: Claudie Revering M.D.   On: 02/22/2015 15:23  02/22/2015  ADDENDUM REPORT: 02/22/2015 15:23 ADDENDUM: The calcifications span an area measuring 3.1 x 2.0 x 0.9 cm in maximum dimensions. Electronically Signed   By: Claudie Revering M.D.   On: 02/22/2015 15:23  02/22/2015  CLINICAL DATA:  Call back from screening for left breast calcifications. EXAM: DIGITAL DIAGNOSTIC LEFT MAMMOGRAM COMPARISON:  Previous exam(s). ACR Breast Density Category b: There are scattered areas of fibroglandular density. FINDINGS: There is a somewhat linearly arranged group of calcifications in the posterior upper outer quadrant of the left breast. The calcifications vary in size and shape. There is some associated density, but no discrete mass and no architectural distortion. IMPRESSION: Suspicious left breast calcifications. RECOMMENDATION: Biopsy is recommended. Calcifications appear amenable to stereotactic core needle biopsy. I have discussed the findings and recommendations with the patient. Results were also provided in writing at the conclusion of the visit. If applicable, a reminder letter will be sent to the patient regarding the next appointment. BI-RADS CATEGORY  4: Suspicious. Electronically Signed: By: Lajean Manes M.D. On: 02/15/2015 15:00   Mm Digital Diagnostic Unilat L  02/18/2015  CLINICAL DATA:  Patient is status post stereotactic biopsy of calcifications posterior left breast upper outer quadrant EXAM: DIAGNOSTIC LEFT MAMMOGRAM POST STEREOTACTIC BIOPSY COMPARISON:  Previous exam(s). FINDINGS: Mammographic images were obtained following stereotactic guided biopsy of calcifications in the upper-outer quadrant of the left breast. Marker clip projects in appropriate position over the cluster of calcifications. IMPRESSION: Appropriate clip position Final Assessment: Post Procedure  Mammograms for Marker Placement Electronically Signed   By: Skipper Cliche M.D.   On: 02/18/2015 16:08   Mm Digital Screening  02/09/2015  CLINICAL DATA:  Screening. EXAM: DIGITAL SCREENING BILATERAL MAMMOGRAM WITH CAD COMPARISON:  Previous exam(s). ACR Breast Density Category b: There are scattered areas of fibroglandular density. FINDINGS: In the left breast, calcifications warrant further evaluation. In the right breast, no findings suspicious for malignancy. Images were processed with CAD. IMPRESSION: Further evaluation is suggested for calcifications in the left breast. RECOMMENDATION: Diagnostic mammogram of the left breast. (Code:[id number]) The patient will be contacted regarding the findings, and additional imaging will be scheduled. BI-RADS CATEGORY  0: Incomplete. Need additional imaging evaluation and/or prior mammograms for comparison. Electronically Signed   By: Margarette Canada M.D.   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 This was found to be concordant by Dr. Shon Hale. Pathology results were discussed with the patient via telephone. The patient reported tenderness at the biopsy site and is doing well otherwise. Post biopsy care and instructions were reviewed and questions were answered. The patient was  encouraged to call [facility] with any additional questions and or concerns. The patient was referred to [facility] on February 24, 2015. Pathology results reported by Terie Purser RN on February 22, 2015. Electronically Signed   By: Skipper Cliche M.D.   On: 02/22/2015 14:06  02/22/2015  CLINICAL DATA:  Calcifications left  breast upper outer quadrant EXAM: LEFT BREAST STEREOTACTIC CORE NEEDLE BIOPSY COMPARISON:  Previous exams. FINDINGS: The patient and I discussed the procedure of stereotactic-guided biopsy including benefits and alternatives. We discussed the high likelihood of a successful procedure. We discussed the risks of the procedure including infection, bleeding, tissue injury, clip migration, and inadequate sampling. Informed written consent was given. The usual time out protocol was performed immediately prior to the procedure. Using sterile technique and 2% Lidocaine as local anesthetic, under stereotactic guidance, a 9 gauge vacuum assisted device was used to perform core needle biopsy of the calcifications in the upper-outer quadrant of the left breast using a lateral medial approach. Specimen radiograph was performed showing calcification. Specimens with calcifications are identified for pathology. At the conclusion of the procedure, a tissue marker clip was deployed into the biopsy cavity. Follow-up 2-view mammogram was performed and dictated separately. IMPRESSION: Stereotactic-guided biopsy of left breast calcifications. No apparent complications. Electronically Signed: By: Skipper Cliche M.D. 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 She appears to be a good candidate for breast conservation treatment.   I discussed with the patient the role of adjuvant radiation treatment in this setting. We discussed the potential benefit of radiation treatment, especially with regards to local control of the patient's tumor. We also discussed the possible side effects and risks of such a treatment as well.  All of the patient's questions were answered. The patient wishes to proceed with radiation treatment at the appropriate time.  PLAN: I look forward to seeing the patient postoperatively to review her case and further discuss and coordinate  an anticipated course of radiation treatment. I anticipate a 6.5 weeks course of treatment given the patient's anatomy.   The patient lives in [place] and prefers to receive radiation treatment there. I will notify staff of this preference.       ________________________________   Jodelle Gross, MD, PhD   **Disclaimer: This note was dictated with voice recognition software. Similar sounding words can inadvertently be transcribed and this note may contain transcription errors which may not have been corrected upon publication of note.**  This document serves as a record of services personally performed by Kyung Rudd, MD. It was created on his behalf by Derek Mound, a trained medical scribe. The creation of this record is based on the scribe's personal observations and the provider's statements to them. This document has been checked and approved by the attending provider.

## 2015-02-24 NOTE — Progress Notes (Signed)
[place]  [phone] [phone]     ID: Elizabeth Hoffman DOB: [birth year]  MR#: [id number]  ZTI#:[id number]  Patient Care Team: Bobetta Lime, MD as PCP - General (Family Medicine) Erroll Luna, MD as Consulting Physician (General Surgery) Chauncey Cruel, MD as Consulting Physician (Oncology) Gery Pray, MD as Consulting Physician (Radiation Oncology) Rockwell Germany, RN as Registered Nurse Mauro Kaufmann, RN as Registered Nurse Will Bonnet, MD as Attending Physician (Obstetrics and Gynecology) PCP: Bobetta Lime, MD OTHER MD:  CHIEF COMPLAINT: Ductal carcinoma in situ, estrogen receptor positive  CURRENT TREATMENT: Awaiting surgical treatment   BREAST CANCER HISTORY: Elizabeth Hoffman was having mild bilateral mastalgia and discuss this with her primary care physician, but her screening bilateral mammography at [facility] 02/09/2015 was routinely scheduled. New calcifications in the left breast were noted and evaluated further 02/15/2015 with a left diagnostic mammogram. This showed a linear group of calcifications in the upper outer quadrant of the left breast extending over an area of approximately 3.1 cm.. There was no discrete mass. The breast density was category B.  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  Her subsequent history is as detailed below  INTERVAL HISTORY: Elizabeth Hoffman was evaluated in the multidisciplinary breast cancer clinic 02/24/2015 accompanied by her friend Elizabeth Hoffman. Her case was also presented in the multidisciplinary breast cancer conference that same morning. At that time a preliminary plan was proposed: Breast conserving surgery with no sentinel lymph node sampling, followed by radiation, we be followed by hormone therapy. Genetics evaluation was also  suggested  REVIEW OF SYSTEMS: There were no specific symptoms leading to the original mammogram, which was routinely scheduled. The patient denies unusual headaches, visual changes, nausea, vomiting, stiff neck, dizziness, or gait imbalance. There has been no cough, phlegm production, or pleurisy, no chest pain or pressure, and no change in bowel or bladder habits. The patient denies fever, rash, bleeding, unexplained fatigue or unexplained weight loss. She admits to some hot flashes, heartburn, and concerns about her weight. A detailed review of systems was otherwise entirely negative.  PAST MEDICAL HISTORY: Past Medical History  Diagnosis Date  . Arthritis   . Depression   . Hypertension   . Anxiety   . Hot flashes     PAST SURGICAL HISTORY: Past Surgical History  Procedure Laterality Date  . Tonsillectomy and adenoidectomy    . Uvulectomy    . Cholecystectomy    . Hernia repair    . Cervical biopsy  w/ loop electrode excision    . Nasal septum surgery      FAMILY HISTORY Family History  Problem Relation Age of Onset  . Adopted: Yes  . Hyperlipidemia Mother   . Hypertension Mother   . Hyperlipidemia Father   . Hypertension Father    the patient is adopted and has no information on her biological family  GYNECOLOGIC HISTORY:  No LMP recorded. Patient is not currently having periods (Reason: Irregular Periods). Menarche age 19, first live birth age 3. She is GX P3. She still having regular periods. She has a Mirena IUD in place. She never used oral contraceptives.  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 Her husband is Elizabeth Hoffman "[employer]. At home it's the 2 of them plus Elizabeth Hoffman and Elizabeth Hoffman, age 43 and 61 respectively, and Elizabeth Hoffman, age 82. The patient attends a [employer]  or [employer] in [place].    ADVANCED DIRECTIVES: Not in place   HEALTH MAINTENANCE: Social History  Substance Use Topics  . Smoking status: Never Smoker   .  Smokeless tobacco: None  . Alcohol Use: No     Colonoscopy:  PAP: September 2016  Bone density:  Lipid panel:  No Known Allergies  Current Outpatient Prescriptions  Medication Sig Dispense Refill  . amLODipine (NORVASC) 5 MG tablet Take 5 mg by mouth daily.    . hydrochlorothiazide (HYDRODIURIL) 25 MG tablet Take 25 mg by mouth daily.    . pantoprazole (PROTONIX) 40 MG tablet 1 (ONE) TABLET DR, ORAL, DAILY 30 tablet 4  . sertraline (ZOLOFT) 100 MG tablet TAKE 1 (ONE) TABLET, ORAL, DAILY 30 tablet 4   No current facility-administered medications for this visit.    OBJECTIVE: Middle-aged white woman who appears stated age 44 Vitals:   02/24/15 1239  BP: 130/61  Pulse: 70  Temp: 97.9 F (36.6 C)  Resp: 18     Body mass index is 50.16 kg/(m^2).    ECOG FS:0 - Asymptomatic  Ocular: Sclerae unicteric, pupils equal, round and reactive to light Ear-nose-throat: Oropharynx clear and moist Lymphatic: No cervical or supraclavicular adenopathy Lungs no rales or rhonchi, good excursion bilaterally Heart regular rate and rhythm, no murmur appreciated Abd soft, obese, nontender, positive bowel sounds MSK no focal spinal tenderness, no joint edema Neuro: non-focal, well-oriented, appropriate affect Breasts: The right breast is unremarkable. The left breast is status post recent biopsy. There is a small ecchymosis. I do not palpate a well-defined mass. The left axilla is benign.   LAB RESULTS:  CMP     Component Value Date/Time   NA 141 02/24/2015 1229   NA 141 02/04/2015 0905   NA 139 09/23/2013 1231   K 3.7 02/24/2015 1229   K 4.2 02/04/2015 0905   K 3.8 09/23/2013 1231   CL 98 02/04/2015 0905   CL 107 09/23/2013 1231   CO2 25 02/24/2015 1229   CO2 25 02/04/2015 0905   CO2 27 09/23/2013 1231   GLUCOSE 114 02/24/2015 1229   GLUCOSE 97 02/04/2015 0905   GLUCOSE 102* 09/23/2013 1231   BUN 10.9 02/24/2015 1229   BUN 13 02/04/2015 0905   BUN 14 09/23/2013 1231    CREATININE 0.9 02/24/2015 1229   CREATININE 0.96 02/04/2015 0905   CREATININE 1.16 09/23/2013 1231   CALCIUM 9.5 02/24/2015 1229   CALCIUM 9.4 02/04/2015 0905   CALCIUM 9.2 09/23/2013 1231   PROT 7.5 02/24/2015 1229   PROT 7.5 02/04/2015 0905   ALBUMIN 3.7 02/24/2015 1229   ALBUMIN 4.3 02/04/2015 0905   AST 29 02/24/2015 1229   AST 25 02/04/2015 0905   ALT 32 02/24/2015 1229   ALT 26 02/04/2015 0905   ALKPHOS 77 02/24/2015 1229   ALKPHOS 72 02/04/2015 0905   BILITOT 0.30 02/24/2015 1229   BILITOT 0.3 02/04/2015 0905   GFRNONAA 73 02/04/2015 0905   GFRNONAA 58* 09/23/2013 1231   GFRAA 84 02/04/2015 0905   GFRAA >60 09/23/2013 1231    INo results found for: SPEP, UPEP  Lab Results  Component Value Date   WBC 8.9 02/24/2015   NEUTROABS 4.5 02/24/2015   HGB 12.8 02/24/2015   HCT 40.1 02/24/2015   MCV 85.0 02/24/2015   PLT 252 02/24/2015      Chemistry      Component Value Date/Time   NA 141 02/24/2015 1229   NA 141 02/04/2015 0905  NA 139 09/23/2013 1231   K 3.7 02/24/2015 1229   K 4.2 02/04/2015 0905   K 3.8 09/23/2013 1231   CL 98 02/04/2015 0905   CL 107 09/23/2013 1231   CO2 25 02/24/2015 1229   CO2 25 02/04/2015 0905   CO2 27 09/23/2013 1231   BUN 10.9 02/24/2015 1229   BUN 13 02/04/2015 0905   BUN 14 09/23/2013 1231   CREATININE 0.9 02/24/2015 1229   CREATININE 0.96 02/04/2015 0905   CREATININE 1.16 09/23/2013 1231      Component Value Date/Time   CALCIUM 9.5 02/24/2015 1229   CALCIUM 9.4 02/04/2015 0905   CALCIUM 9.2 09/23/2013 1231   ALKPHOS 77 02/24/2015 1229   ALKPHOS 72 02/04/2015 0905   AST 29 02/24/2015 1229   AST 25 02/04/2015 0905   ALT 32 02/24/2015 1229   ALT 26 02/04/2015 0905   BILITOT 0.30 02/24/2015 1229   BILITOT 0.3 02/04/2015 0905       No results found for: LABCA2  No components found for: LABCA125  No results for input(s): INR in the last 168 hours.  Urinalysis No results found for: COLORURINE, APPEARANCEUR,  LABSPEC, PHURINE, GLUCOSEU, HGBUR, BILIRUBINUR, KETONESUR, PROTEINUR, UROBILINOGEN, NITRITE, LEUKOCYTESUR  STUDIES: Mm Digital Diagnostic Unilat L  02/22/2015  ADDENDUM REPORT: 02/22/2015 15:23 ADDENDUM: The calcifications span an area measuring 3.1 x 2.0 x 0.9 cm in maximum dimensions. Electronically Signed   By: Claudie Revering M.D.   On: 02/22/2015 15:23  02/22/2015  ADDENDUM REPORT: 02/22/2015 15:23 ADDENDUM: The calcifications span an area measuring 3.1 x 2.0 x 0.9 cm in maximum dimensions. Electronically Signed   By: Claudie Revering M.D.   On: 02/22/2015 15:23  02/22/2015  CLINICAL DATA:  Call back from screening for left breast calcifications. EXAM: DIGITAL DIAGNOSTIC LEFT MAMMOGRAM COMPARISON:  Previous exam(s). ACR Breast Density Category b: There are scattered areas of fibroglandular density. FINDINGS: There is a somewhat linearly arranged group of calcifications in the posterior upper outer quadrant of the left breast. The calcifications vary in size and shape. There is some associated density, but no discrete mass and no architectural distortion. IMPRESSION: Suspicious left breast calcifications. RECOMMENDATION: Biopsy is recommended. Calcifications appear amenable to stereotactic core needle biopsy. I have discussed the findings and recommendations with the patient. Results were also provided in writing at the conclusion of the visit. If applicable, a reminder letter will be sent to the patient regarding the next appointment. BI-RADS CATEGORY  4: Suspicious. Electronically Signed: By: Lajean Manes M.D. On: 02/15/2015 15:00   Mm Digital Diagnostic Unilat L  02/18/2015  CLINICAL DATA:  Patient is status post stereotactic biopsy of calcifications posterior left breast upper outer quadrant EXAM: DIAGNOSTIC LEFT MAMMOGRAM POST STEREOTACTIC BIOPSY COMPARISON:  Previous exam(s). FINDINGS: Mammographic images were obtained following stereotactic guided biopsy of calcifications in the upper-outer quadrant  of the left breast. Marker clip projects in appropriate position over the cluster of calcifications. IMPRESSION: Appropriate clip position Final Assessment: Post Procedure Mammograms for Marker Placement Electronically Signed   By: Skipper Cliche M.D.   On: 02/18/2015 16:08   Mm Digital Screening  02/09/2015  CLINICAL DATA:  Screening. EXAM: DIGITAL SCREENING BILATERAL MAMMOGRAM WITH CAD COMPARISON:  Previous exam(s). ACR Breast Density Category b: There are scattered areas of fibroglandular density. FINDINGS: In the left breast, calcifications warrant further evaluation. In the right breast, no findings suspicious for malignancy. Images were processed with CAD. IMPRESSION: Further evaluation is suggested for calcifications in the left breast. RECOMMENDATION: Diagnostic  mammogram of the left breast. (Code:[id number]) The patient will be contacted regarding the findings, and additional imaging will be scheduled. BI-RADS CATEGORY  0: Incomplete. Need additional imaging evaluation and/or prior mammograms for comparison. Electronically Signed   By: Margarette Canada M.D.   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 This was found to be concordant by Dr. Shon Hale. Pathology results were discussed with the patient via telephone. The patient reported tenderness at the biopsy site and is doing well otherwise. Post biopsy care and instructions were reviewed and questions were answered. The patient was encouraged to call [facility] with any additional questions and or concerns. The patient was referred to [facility] on February 24, 2015. Pathology results  reported by Terie Purser RN on February 22, 2015. Electronically Signed   By: Skipper Cliche M.D.   On: 02/22/2015 14:06  02/22/2015  CLINICAL DATA:  Calcifications left breast upper outer quadrant EXAM: LEFT BREAST STEREOTACTIC CORE NEEDLE BIOPSY COMPARISON:  Previous exams. FINDINGS: The patient and I discussed the procedure of stereotactic-guided biopsy including benefits and alternatives. We discussed the high likelihood of a successful procedure. We discussed the risks of the procedure including infection, bleeding, tissue injury, clip migration, and inadequate sampling. Informed written consent was given. The usual time out protocol was performed immediately prior to the procedure. Using sterile technique and 2% Lidocaine as local anesthetic, under stereotactic guidance, a 9 gauge vacuum assisted device was used to perform core needle biopsy of the calcifications in the upper-outer quadrant of the left breast using a lateral medial approach. Specimen radiograph was performed showing calcification. Specimens with calcifications are identified for pathology. At the conclusion of the procedure, a tissue marker clip was deployed into the biopsy cavity. Follow-up 2-view mammogram was performed and dictated separately. IMPRESSION: Stereotactic-guided biopsy of left breast calcifications. No apparent complications. Electronically Signed: By: Skipper Cliche M.D. On: 02/18/2015 16:06    ASSESSMENT: 44 y.o. Elizabeth Hoffman, Elizabeth Hoffman woman s/p left breast upper outer quadrant biopsy 02/18/2015 for a ductal carcinoma in situ, grade 2 or 3, estrogen and progesterone receptor positive  (1) breast conserving surgery pending  (2) adjuvant radiation to follow surgery  (3) tamoxifen to follow radiation  (a) the patient is considering a hysterectomy with bilateral salpingo-oophorectomy  (4) genetics testing pending  PLAN: Elizabeth Hoffman understands that in noninvasive ductal carcinoma, also called ductal carcinoma in situ  ("DCIS") the breast cancer cells remain trapped in the ducts were they started. They cannot travel to a vital organ. For that reason these cancers in themselves are not life-threatening.  If the whole breast is removed then all the ducts are removed and since the cancer cells are trapped in the ducts, the cure rate with mastectomy for noninvasive breast cancer is approximately 99%. Nevertheless we recommend lumpectomy, because there is no survival advantage to mastectomy and because the cosmetic result is generally superior with breast conservation.  Elizabeth Hoffman qualifies for genetic testing because of her young age and because of the absolute lack of family history. In patients who carry a deleterious mutations such as for example BRCA, the risk of a new breast cancer developing may be sufficiently great that the patient  may choose bilateral mastectomies. However if the patient wishes to keep her breasts in that situation it is safe to do so. That would require intensified screening, which generally means not only yearly mammography but a yearly breast MRI as well.   Of course, if there is a deleterious mutation bilateral oophorectomy would be necessary as there is no standard screening protocol for ovarian cancer. Elizabeth Hoffman tells me that because of her difficult periods and perimenstrual symptoms she was considering a hysterectomy with bilateral salpingo-oophorectomy anyway. We did discuss early menopause issues but if she did decide to have a hysterectomy then the concern regarding tamoxifen promoting endometrial carcinoma, though this is quite rare, would be obviated.  Incidentally I am comfortable with her staying on her Mirena IUD if she does take tamoxifen. Otherwise she should consider switching to a copper IUD.  After this discussion Elizabeth Hoffman is comfortable with the idea of proceeding to lumpectomy without sentinel lymph node sampling and even if she carried a mutation she is leaning towards intensified  surveillance rather than bilateral mastectomies. After lumpectomy she will receive radiation, which will decrease her risk of local recurrence to generally less than 10% by taking tamoxifen for 5 years she will cut that risk again in half and she will cut her risk of developing another breast cancer in either breast in half as well.  We did discuss the possible toxicities, side effects and palpitations of tamoxifen though somewhat briefly today. We will review these when she returns to see me in approximately 2 months. By that time she should be done with her local treatment and should be ready to start systemic therapy.  Elizabeth Hoffman has a good understanding of the overall plan. She agrees with it. She knows the goal of treatment in her case is cure. She will call with any problems that may develop before her next visit here.  Chauncey Cruel, MD   02/24/2015 1:44 PM Medical Oncology and Hematology [facility] [address] [place], [place] [address] Tel. [phone]    Fax. [phone]

## 2015-02-24 NOTE — H&P (Signed)
Elizabeth Hoffman 02/24/2015 8:10 AM Location: [facility] Patient #: [phone] [phone] DOB: 1970/11/03 Undefined / Language: Elizabeth Hoffman / Race: White Female History of Present Illness Elizabeth Hoffman A. Chrystian Cupples Hoffman; 02/24/2015 3:04 PM) Patient words: patient sent at the request of Dr. Lisbeth Hoffman for left breast microcalcifications measuring 3 cm. 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 No family history of breast cancer.She denies any breaat luof either breast.arge or change in the appearance of either breast. No history of breast biopsy.             CLINICAL DATA: Screening.  EXAM: DIGITAL SCREENING BILATERAL MAMMOGRAM WITH CAD  COMPARISON: Previous exam(s).  ACR Breast Density Category b: There are scattered areas of fibroglandular density.  FINDINGS: In the left breast, calcifications warrant further evaluation. In the right breast, no findings suspicious for malignancy. Images were processed with CAD.  IMPRESSION: Further evaluation is suggested for calcifications in the left breast.  RECOMMENDATION: Diagnostic mammogram of the left breast. (Hoffman:[id number])  The patient will be contacted regarding the findings, and additional imaging will be scheduled.  BI-RADS CATEGORY 0: Incomplete. Need additional imaging evaluation and/or prior mammograms for comparison.   Electronically Signed By: Elizabeth Hoffman M.D. On: 02/09/2015 15:56       CLINICAL DATA: Patient is status post stereotactic biopsy of calcifications posterior left breast upper outer quadrant EXAM: DIAGNOSTIC LEFT MAMMOGRAM POST STEREOTACTIC BIOPSY COMPARISON: Previous exam(s). FINDINGS: Mammographic images were obtained following stereotactic guided biopsy of calcifications in the upper-outer quadrant of the left breast. Marker clip projects in appropriate position over the cluster of calcifications. IMPRESSION: Appropriate clip position Final Assessment:  Post Procedure Mammograms for Marker Placement Electronically Signed By: Elizabeth Hoffman M.D. 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 The [place] has been informed. (02/19/2015). ER and PR has been ordered and will issue as an addendum. 1 of 2 FINAL for Elizabeth Hoffman ([id number]) Elizabeth Lanius Hoffman Pathologist, Electronic Signature (Case signed 02/19/2015) Specimen Gross and Clinical Information.  The patient is a 44 year old female   Other Problems Elizabeth Slipper, RN; 02/24/2015 8:10 AM) Anxiety Disorder Arthritis Breast Cancer Cholelithiasis Gastroesophageal Reflux Disease High blood pressure Lump In Breast Umbilical Hernia Repair  Past Surgical History Elizabeth Slipper, RN; 02/24/2015 8:10 AM) Breast Biopsy Left. Gallbladder Surgery - Laparoscopic Oral Surgery Tonsillectomy Ventral / Umbilical Hernia Surgery Bilateral.  Diagnostic Studies History Elizabeth Slipper, RN; 02/24/2015 8:10 AM) Colonoscopy never Mammogram within last year  Medication History Elizabeth Slipper, RN; 02/24/2015 8:10 AM) No Current Medications Medications Reconciled  Social History Elizabeth Slipper, RN; 02/24/2015 8:10 AM) Alcohol use  Remotely quit alcohol use. Caffeine use Coffee. Tobacco use Never smoker.  Family History Elizabeth Slipper, RN; 02/24/2015 8:10 AM) Family history unknown First Degree Relatives  Pregnancy / Birth History Elizabeth Slipper, RN; 02/24/2015 8:10 AM) Age at menarche 78 years. Contraceptive History Contraceptive implant, Oral contraceptives. Gravida 3 Irregular periods Maternal age 74-30 Para 39  Review of Systems Elizabeth Slipper RN; 02/24/2015 8:10 AM) General Not Present- Appetite Loss, Chills, Fatigue, Fever, Night Sweats, Weight Gain and Weight Loss. Skin Not Present- Change in Wart/Mole, Dryness, Hives, Jaundice, New Lesions, Non-Healing Wounds, Rash and Ulcer. HEENT Not Present- Earache, Hearing Loss, Hoarseness, Nose Bleed, Oral Ulcers, Ringing in the Ears, Seasonal Allergies, Sinus Pain, Sore Throat, Visual Disturbances, Wears glasses/contact lenses and Yellow Eyes. Respiratory Present- Snoring. Not Present- Bloody sputum, Chronic Cough, Difficulty Breathing and Wheezing. Breast Not Present- Breast Mass, Breast Pain, Nipple Discharge and Skin Changes. Cardiovascular Not Present- Chest Pain, Difficulty Breathing Lying Down, Leg Cramps, Palpitations, Rapid Heart Rate, Shortness of Breath and Swelling of Extremities. Gastrointestinal Not Present- Abdominal Pain, Bloating, Bloody Stool, Change in Bowel Habits, Chronic diarrhea, Constipation, Difficulty Swallowing, Excessive gas, Gets full quickly at meals, Hemorrhoids, Indigestion, Nausea, Rectal Pain and Vomiting. Female Genitourinary Present- Nocturia. Not Present- Frequency, Painful Urination, Pelvic Pain and Urgency. Musculoskeletal Not Present- Back Pain, Joint Pain, Joint Stiffness, Muscle Pain, Muscle Weakness and Swelling of Extremities. Neurological Not Present- Decreased Memory, Fainting, Headaches, Numbness, Seizures, Tingling, Tremor, Trouble walking and Weakness. Psychiatric Present- Anxiety. Not Present- Bipolar, Change in  Sleep Pattern, Depression, Fearful and Frequent crying. Endocrine Present- Hot flashes. Not Present- Cold Intolerance, Excessive Hunger, Hair Changes, Heat Intolerance and New Diabetes. Hematology Not Present- Easy Bruising, Excessive bleeding, Gland problems, HIV and Persistent Infections.   Physical Exam (Emrie Gayle A. Corey Laski Hoffman; 02/24/2015 3:04 PM)  General Mental Status-Alert. General Appearance-Consistent with stated age. Hydration-Well hydrated. Voice-Normal.  Integumentary Note: tattoo left breast   Head and Neck Head-normocephalic, atraumatic with no lesions or palpable masses. Trachea-midline. Thyroid Gland Characteristics - normal size and consistency.  Eye Eyeball - Bilateral-Extraocular movements intact. Sclera/Conjunctiva - Bilateral-No scleral icterus.  Chest and Lung Exam Chest and lung exam reveals -quiet, even and easy respiratory effort with no use of accessory muscles and on auscultation, normal breath sounds, no adventitious sounds and normal vocal resonance. Inspection Chest Wall - Normal. Back - normal.  Breast Breast - Left-Symmetric, Non Tender, No Biopsy scars, no Dimpling, No Inflammation, No Lumpectomy scars, No Mastectomy scars, No Peau d' Orange. Breast - Right-Symmetric, Non Tender, No Biopsy scars, no Dimpling, No Inflammation, No Lumpectomy scars, No Mastectomy scars, No Peau d' Orange. Breast Lump-No Palpable Breast Mass.  Cardiovascular Cardiovascular examination reveals -normal heart sounds, regular rate and rhythm with no murmurs and normal pedal pulses bilaterally.  Neurologic Neurologic evaluation reveals -alert and oriented x 3 with no impairment of recent or remote memory. Mental Status-Normal.  Lymphatic Axillary  General Axillary Region: Bilateral - Description - Normal. Tenderness - Non Tender.    Assessment & Plan (Layann Bluett A. Karlee Staff Hoffman; 02/24/2015 3:06 PM)  DCIS (DUCTAL CARCINOMA IN SITU),  LEFT (D05.12) Impression: DISCUSSED TREATMENT OPTIONS FOR SURGERY TO INCLUDE PARTIAL MASTECTOMY VERSUS MASTECTOMY. Patient is opted for genetics as well. She would like to proceed with left breast partial mastectomy after genetics if she is not a carrier. Risk of lumpectomy include bleeding, infection, seroma, more surgery, use of seed/wire, wound care, cosmetic deformity and the need for other treatments, death , blood clots, death. Pt agrees to proceed.  Current Plans   The anatomy and the physiology was discussed. The pathophysiology and natural history of the disease was discussed. Options were discussed and recommendations were made. Technique, risks, benefits, & alternatives were discussed. Risks such as stroke, heart attack, bleeding, indection, death, and other risks discussed. Questions answered. The patient agrees to proceed. Pt Education -  CCS Breast Cancer Information Given - [facility] "Breast Journey" Package Pt Education - [facility] Breast Biopsy HCI: discussed with patient and provided information.

## 2015-02-24 NOTE — Progress Notes (Signed)
Ms. Elizabeth Hoffman is a very pleasant 44 y.o. 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  Biopsy results revealed the tumor's prognostic profile is ER positive and PR positive.  She presents today with her best friend to the [facility]) for treatment consideration and recommendations from the breast surgeon, radiation oncologist, and medical oncologist.     I briefly met with Ms. Elizabeth Hoffman and her best friend during her [facility] visit today. We discussed the purpose of the Survivorship Clinic, which will include monitoring for recurrence, coordinating completion of age and gender-appropriate cancer screenings, promotion of overall wellness, as well as managing potential late/long-term side effects of anti-cancer treatments.    The treatment plan for Ms. Elizabeth Hoffman will likely include surgery, radiation therapy, and anti-estrogen therapy.  She will meet with the Genetics Counselor due to her age. As of today, the intent of treatment for Ms. Elizabeth Hoffman is cure, therefore she will be eligible for the Survivorship Clinic upon her completion of treatment.  Her survivorship care plan (SCP) document will be drafted and updated throughout the course of her treatment trajectory. She will receive the SCP in an office visit with myself in the Survivorship Clinic once she has completed treatment.   Ms. Elizabeth Hoffman was encouraged to ask questions and all questions were answered to her satisfaction.  She was given my business card and encouraged to contact me with any concerns regarding survivorship.  I look forward to participating in her care.   Kenn File, [place] [phone]

## 2015-02-24 NOTE — Progress Notes (Signed)
REFERRING PROVIDER: Ashany Sundaram, MD [address] [place], [place] [address]   Elizabeth Magrinat, MD  PRIMARY PROVIDER:  Ashany Sundaram, MD  PRIMARY REASON FOR VISIT:  1. Breast cancer of upper-outer quadrant of left female breast (HCC)      HISTORY OF PRESENT ILLNESS:   Elizabeth Hoffman, a 44 y.o. female, was seen for a [facility] cancer genetics consultation at the request of Dr. Magrinat due to a personal history of cancer.  Elizabeth Hoffman presents to clinic today to discuss the possibility of a hereditary predisposition to cancer, genetic testing, and to further clarify her future cancer risks, as well as potential cancer risks for family members.   In 2016, at the age of 44, Elizabeth Hoffman was diagnosed with DCIS of the left breast.  The tumor is ER+/PR+. Depending on the results of her genetics test, this will be treated with lumpectomy and radiation.     CANCER HISTORY:   No history exists.     HORMONAL RISK FACTORS:  Menarche was at age 14.  First live birth at age 28.  OCP use for approximately 5 years.  Ovaries intact: yes.  Hysterectomy: no.  Menopausal status: premenopausal.  HRT use: 0 years. Colonoscopy: no; not examined. Mammogram within the last year: yes. Number of breast biopsies: 1. Up to date with pelvic exams:  yes. Any excessive radiation exposure in the past:  no  Past Medical History  Diagnosis Date  . Arthritis   . Depression   . Hypertension   . Anxiety   . Hot flashes   . Breast cancer (HCC) 2016    Past Surgical History  Procedure Laterality Date  . Tonsillectomy and adenoidectomy    . Uvulectomy    . Cholecystectomy    . Hernia repair    . Cervical biopsy  w/ loop electrode excision    . Nasal septum surgery      Social History   Social History  . Marital Status: Married    Spouse Name: N/A  . Number of Children: 3  . Years of Education: N/A   Social History Main Topics  . Smoking status: Never Smoker   . Smokeless  tobacco: None  . Alcohol Use: No  . Drug Use: No  . Sexual Activity:    Partners: Male   Other Topics Concern  . None   Social History Narrative     FAMILY HISTORY:  We obtained a detailed, 4-generation family history.  Significant diagnoses are listed below: Family History  Problem Relation Age of Onset  . Adopted: Yes  . Hyperlipidemia Mother   . Hypertension Mother   . Hyperlipidemia Father   . Hypertension Father    The patient has three children, all whom are cancer free.  She is adopted and does not know anything about her biological family history.  Patient's ancestors are of Caucasian descent. There is no reported Ashkenazi Jewish ancestry. There is no known consanguinity.  GENETIC COUNSELING ASSESSMENT: Elizabeth Hoffman is a 44 y.o. female with a personal history of early onset breast cancer which somewhat suggestive of a hereditary cancer syndrome and predisposition to cancer. We, therefore, discussed and recommended the following at today's visit.   DISCUSSION: We discussed that about 5-10% of breast cancer is hereditary. Most cases are the result of BRCA mutations, however we see mutations in other genes as well.  Most commonly those other genes are ATM, CHEK2 and PALB2.  Based on the fact that the patient   is adopted, we cannot determine if her family history is consistent with a mutation in one of these genes.  She meets criteria for testing based on her age of onset.  We reviewed the characteristics, features and inheritance patterns of hereditary cancer syndromes. We also discussed genetic testing, including the appropriate family members to test, the process of testing, insurance coverage and turn-around-time for results. We discussed the implications of a negative, positive and/or variant of uncertain significant result. We recommended Elizabeth Hoffman pursue genetic testing for the Breast/Ovarian cancer gene panel. The Breast/Ovarian gene panel offered by [facility] includes  sequencing and rearrangement analysis for the following 20 genes:  ATM, BARD1, BRCA1, BRCA2, BRIP1, CDH1, CHEK2, EPCAM, FANCC, MLH1, MSH2, MSH6, NBN, PALB2, PMS2, PTEN, RAD51C, RAD51D, TP53, and XRCC2.     Based on Elizabeth Hoffman's personal history of cancer, she meets medical criteria for genetic testing. Despite that she meets criteria, she may still have an out of pocket cost. We discussed that if her out of pocket cost for testing is over $100, the laboratory will call and confirm whether she wants to proceed with testing.  If the out of pocket cost of testing is less than $100 she will be billed by the genetic testing laboratory.   PLAN: After considering the risks, benefits, and limitations, Elizabeth Hoffman  provided informed consent to pursue genetic testing and the blood sample was sent to [facility] for analysis of the Breast/Ovarian cancer panel. Results should be available within approximately 2-3 weeks' time, at which point they will be disclosed by telephone to Elizabeth Hoffman, as will any additional recommendations warranted by these results. Elizabeth Hoffman will receive a summary of her genetic counseling visit and a copy of her results once available. This information will also be available in Epic. We encouraged Elizabeth Hoffman to remain in contact with cancer genetics annually so that we can continuously update the family history and inform her of any changes in cancer genetics and testing that may be of benefit for her family. Elizabeth Hoffman's questions were answered to her satisfaction today. Our contact information was provided should additional questions or concerns arise.  Lastly, we encouraged Elizabeth Hoffman to remain in contact with cancer genetics annually so that we can continuously update the family history and inform her of any changes in cancer genetics and testing that may be of benefit for this family.   [name]'s questions were answered to her satisfaction today. Our contact information  was provided should additional questions or concerns arise. Thank you for the referral and allowing us to share in the care of your patient.   Karen P. Powell, MS, CGC Certified Genetic Counselor [email or web address] phone: [phone]  The patient was seen for a total of 30 minutes in face-to-face genetic counseling.  This patient was discussed with Drs. Hoffman, Gudena and/or Feng who agrees with the above.    _______________________________________________________________________ For Office Staff:  Number of people involved in session: 2 Was an Intern/ student involved with case: no    

## 2015-02-25 ENCOUNTER — Telehealth: Payer: Self-pay | Admitting: Oncology

## 2015-02-25 NOTE — Telephone Encounter (Signed)
appointments made and and avs mailed to pateint

## 2015-03-02 ENCOUNTER — Telehealth: Payer: Self-pay | Admitting: *Deleted

## 2015-03-02 NOTE — Telephone Encounter (Signed)
Spoke with patient from [facility] 10/12.  She is doing well just waiting on surgery date so she can arrange child care.  I have sent message to Dr. Josetta Huddle office to see when this will get scheduled.  Encouraged her to call with any other needs or concerns.

## 2015-03-03 ENCOUNTER — Other Ambulatory Visit: Payer: Self-pay | Admitting: Surgery

## 2015-03-03 DIAGNOSIS — D0512 Intraductal carcinoma in situ of left breast: Secondary | ICD-10-CM

## 2015-03-08 ENCOUNTER — Telehealth: Payer: Self-pay | Admitting: Genetic Counselor

## 2015-03-08 ENCOUNTER — Encounter: Payer: Self-pay | Admitting: Genetic Counselor

## 2015-03-08 ENCOUNTER — Ambulatory Visit: Payer: Self-pay | Admitting: Genetic Counselor

## 2015-03-08 DIAGNOSIS — Z1379 Encounter for other screening for genetic and chromosomal anomalies: Secondary | ICD-10-CM | POA: Insufficient documentation

## 2015-03-08 DIAGNOSIS — C50412 Malignant neoplasm of upper-outer quadrant of left female breast: Secondary | ICD-10-CM

## 2015-03-08 NOTE — Telephone Encounter (Signed)
Negative genetic test results on the Breast/Ovarian cancer panel.

## 2015-03-08 NOTE — Progress Notes (Signed)
HPI: Ms. Luviano was previously seen in the [facility] clinic due to a personal history of cancer and concerns regarding a hereditary predisposition to cancer. Please refer to our prior cancer genetics clinic note for more information regarding Ms. Dotter's medical, social and family histories, and our assessment and recommendations, at the time. Ms. Menta recent genetic test results were disclosed to her, as were recommendations warranted by these results. These results and recommendations are discussed in more detail below.  FAMILY HISTORY:  We obtained a detailed, 4-generation family history.  Significant diagnoses are listed below: Family History  Problem Relation Age of Onset  . Adopted: Yes  . Hyperlipidemia Mother   . Hypertension Mother   . Hyperlipidemia Father   . Hypertension Father     The patient has three children, all whom are cancer free. She is adopted and does not know anything about her biological family history. Patient's ancestors are of Caucasian descent. There is no reported Ashkenazi Jewish ancestry. There is no known consanguinity.  GENETIC TEST RESULTS: At the time of Ms. Moist's visit, we recommended she pursue genetic testing of the Breast/Ovarian cancer gene panel. The Breast/Ovarian gene panel offered by [facility] includes sequencing and rearrangement analysis for the following 20 genes:  ATM, BARD1, BRCA1, BRCA2, BRIP1, CDH1, CHEK2, EPCAM, FANCC, MLH1, MSH2, MSH6, NBN, PALB2, PMS2, PTEN, RAD51C, RAD51D, TP53, and XRCC2.   The report date is March 05, 2015.  Genetic testing was normal, and did not reveal a deleterious mutation in these genes. The test report has been scanned into EPIC and is located under the Molecular Pathology section of the Results Review tab.   We discussed with Ms. Tatro that since the current genetic testing is not perfect, it is possible there may be a gene mutation in one of these genes that current testing cannot  detect, but that chance is small. We also discussed, that it is possible that another gene that has not yet been discovered, or that we have not yet tested, is responsible for the cancer diagnoses in the family, and it is, therefore, important to remain in touch with cancer genetics in the future so that we can continue to offer Ms. Duffell the most up to date genetic testing.   CANCER SCREENING RECOMMENDATIONS:This result is reassuring and indicates that Ms. Doubleday likely does not have an increased risk for a future cancer due to a mutation in one of these genes. This normal test also suggests that Ms. Hardgrave's cancer was most likely not due to an inherited predisposition associated with one of these genes.  Most cancers happen by chance and this negative test suggests that her cancer falls into this category.  We, therefore, recommended she continue to follow the cancer management and screening guidelines provided by her oncology and primary healthcare provider.   RECOMMENDATIONS FOR FAMILY MEMBERS: Women in this family might be at some increased risk of developing cancer, over the general population risk, simply due to the family history of cancer. We recommended women in this family have a yearly mammogram beginning at age 65, or 48 years younger than the earliest onset of cancer, an an annual clinical breast exam, and perform monthly breast self-exams. Women in this family should also have a gynecological exam as recommended by their primary provider. All family members should have a colonoscopy by age 50.  FOLLOW-UP: Lastly, we discussed with Ms. Bame that cancer genetics is a rapidly advancing field and it is possible that new genetic  tests will be appropriate for her and/or her family members in the future. We encouraged her to remain in contact with cancer genetics on an annual basis so we can update her personal and family histories and let her know of advances in cancer genetics that may  benefit this family.   Our contact number was provided. Ms. Tostenson questions were answered to her satisfaction, and she knows she is welcome to call us at anytime with additional questions or concerns.   Roma Kayser, MS, [facility] Certified Genetic Counselor [email or web address]

## 2015-03-11 ENCOUNTER — Encounter (HOSPITAL_BASED_OUTPATIENT_CLINIC_OR_DEPARTMENT_OTHER): Payer: Self-pay | Admitting: *Deleted

## 2015-03-11 NOTE — Pre-Procedure Instructions (Signed)
BMI reviewed with Dr. Al Corpus. Pt to come for PAT, will get weight and Anesthesia consult at that time.

## 2015-03-15 ENCOUNTER — Ambulatory Visit
Admission: RE | Admit: 2015-03-15 | Discharge: 2015-03-15 | Disposition: A | Discharge status: Home / Self Care | Payer: Medicaid Other | Source: Ambulatory Visit | Attending: Surgery | Admitting: Surgery

## 2015-03-15 ENCOUNTER — Other Ambulatory Visit: Payer: Self-pay

## 2015-03-15 ENCOUNTER — Encounter (HOSPITAL_BASED_OUTPATIENT_CLINIC_OR_DEPARTMENT_OTHER)
Admission: RE | Admit: 2015-03-15 | Discharge: 2015-03-15 | Disposition: A | Discharge status: Home / Self Care | Payer: Medicaid Other | Source: Ambulatory Visit | Attending: Surgery | Admitting: Surgery

## 2015-03-15 DIAGNOSIS — D0512 Intraductal carcinoma in situ of left breast: Secondary | ICD-10-CM | POA: Diagnosis not present

## 2015-03-15 DIAGNOSIS — Z6841 Body Mass Index (BMI) 40.0 and over, adult: Secondary | ICD-10-CM | POA: Diagnosis not present

## 2015-03-15 DIAGNOSIS — R030 Elevated blood-pressure reading, without diagnosis of hypertension: Secondary | ICD-10-CM | POA: Diagnosis not present

## 2015-03-15 DIAGNOSIS — Z170 Estrogen receptor positive status [ER+]: Secondary | ICD-10-CM | POA: Diagnosis not present

## 2015-03-15 DIAGNOSIS — R351 Nocturia: Secondary | ICD-10-CM | POA: Diagnosis not present

## 2015-03-15 DIAGNOSIS — Z01818 Encounter for other preprocedural examination: Secondary | ICD-10-CM | POA: Insufficient documentation

## 2015-03-15 DIAGNOSIS — F419 Anxiety disorder, unspecified: Secondary | ICD-10-CM | POA: Diagnosis not present

## 2015-03-15 DIAGNOSIS — R921 Mammographic calcification found on diagnostic imaging of breast: Secondary | ICD-10-CM | POA: Diagnosis not present

## 2015-03-15 DIAGNOSIS — M1990 Unspecified osteoarthritis, unspecified site: Secondary | ICD-10-CM | POA: Diagnosis not present

## 2015-03-15 DIAGNOSIS — N926 Irregular menstruation, unspecified: Secondary | ICD-10-CM | POA: Diagnosis not present

## 2015-03-15 DIAGNOSIS — R0683 Snoring: Secondary | ICD-10-CM | POA: Diagnosis not present

## 2015-03-15 DIAGNOSIS — Z9049 Acquired absence of other specified parts of digestive tract: Secondary | ICD-10-CM | POA: Diagnosis not present

## 2015-03-15 DIAGNOSIS — K219 Gastro-esophageal reflux disease without esophagitis: Secondary | ICD-10-CM | POA: Diagnosis not present

## 2015-03-15 DIAGNOSIS — D0592 Unspecified type of carcinoma in situ of left breast: Secondary | ICD-10-CM | POA: Diagnosis present

## 2015-03-15 DIAGNOSIS — N951 Menopausal and female climacteric states: Secondary | ICD-10-CM | POA: Diagnosis not present

## 2015-03-15 DIAGNOSIS — I10 Essential (primary) hypertension: Secondary | ICD-10-CM | POA: Diagnosis not present

## 2015-03-15 LAB — CBC WITH DIFFERENTIAL/PLATELET
Basophils Absolute: 0 10*3/uL (ref 0.0–0.1)
Basophils Relative: 0 %
EOS PCT: 10 %
Eosinophils Absolute: 1.2 10*3/uL — ABNORMAL HIGH (ref 0.0–0.7)
HEMATOCRIT: 39.5 % (ref 36.0–46.0)
Hemoglobin: 12.3 g/dL (ref 12.0–15.0)
LYMPHS ABS: 2.9 10*3/uL (ref 0.7–4.0)
LYMPHS PCT: 26 %
MCH: 26.9 pg (ref 26.0–34.0)
MCHC: 31.1 g/dL (ref 30.0–36.0)
MCV: 86.4 fL (ref 78.0–100.0)
MONO ABS: 0.7 10*3/uL (ref 0.1–1.0)
MONOS PCT: 7 %
NEUTROS ABS: 6.4 10*3/uL (ref 1.7–7.7)
Neutrophils Relative %: 57 %
Platelets: 290 10*3/uL (ref 150–400)
RBC: 4.57 MIL/uL (ref 3.87–5.11)
RDW: 15 % (ref 11.5–15.5)
WBC: 11.2 10*3/uL — ABNORMAL HIGH (ref 4.0–10.5)

## 2015-03-15 LAB — COMPREHENSIVE METABOLIC PANEL
ALT: 26 U/L (ref 14–54)
ANION GAP: 10 (ref 5–15)
AST: 25 U/L (ref 15–41)
Albumin: 3.6 g/dL (ref 3.5–5.0)
Alkaline Phosphatase: 72 U/L (ref 38–126)
BILIRUBIN TOTAL: 0.4 mg/dL (ref 0.3–1.2)
BUN: 10 mg/dL (ref 6–20)
CO2: 27 mmol/L (ref 22–32)
Calcium: 10 mg/dL (ref 8.9–10.3)
Chloride: 104 mmol/L (ref 101–111)
Creatinine, Ser: 1.03 mg/dL — ABNORMAL HIGH (ref 0.44–1.00)
Glucose, Bld: 125 mg/dL — ABNORMAL HIGH (ref 65–99)
POTASSIUM: 3.9 mmol/L (ref 3.5–5.1)
Sodium: 141 mmol/L (ref 135–145)
TOTAL PROTEIN: 7.1 g/dL (ref 6.5–8.1)

## 2015-03-16 ENCOUNTER — Telehealth: Payer: Self-pay | Admitting: *Deleted

## 2015-03-16 NOTE — Telephone Encounter (Signed)
-----   Message from Mauro Kaufmann, RN sent at 03/16/2015  3:56 PM EDT ----- Regarding: [place],  Ms Hosein was seen in clinic on 10/12. She is scheduled for the following.  Surgery 11/3 F/u appt with Dr. Baruch Gouty - 11/10 F/u with Dr. Jana Hakim - 05/18/15  Please let me know if you have questions. Thanks, [employer]

## 2015-03-18 ENCOUNTER — Encounter (HOSPITAL_BASED_OUTPATIENT_CLINIC_OR_DEPARTMENT_OTHER): Payer: Self-pay | Admitting: *Deleted

## 2015-03-18 ENCOUNTER — Ambulatory Visit (HOSPITAL_BASED_OUTPATIENT_CLINIC_OR_DEPARTMENT_OTHER)
Admission: RE | Admit: 2015-03-18 | Discharge: 2015-03-18 | Disposition: A | Discharge status: Home / Self Care | Payer: Medicaid Other | Source: Ambulatory Visit | Attending: Surgery | Admitting: Surgery

## 2015-03-18 ENCOUNTER — Ambulatory Visit
Admission: RE | Admit: 2015-03-18 | Discharge: 2015-03-18 | Disposition: A | Discharge status: Home / Self Care | Payer: Medicaid Other | Source: Ambulatory Visit | Attending: Surgery | Admitting: Surgery

## 2015-03-18 ENCOUNTER — Ambulatory Visit (HOSPITAL_BASED_OUTPATIENT_CLINIC_OR_DEPARTMENT_OTHER): Payer: Medicaid Other | Admitting: Anesthesiology

## 2015-03-18 ENCOUNTER — Encounter (HOSPITAL_BASED_OUTPATIENT_CLINIC_OR_DEPARTMENT_OTHER)
Admission: RE | Disposition: A | Discharge status: Home / Self Care | Payer: Self-pay | Source: Ambulatory Visit | Attending: Surgery

## 2015-03-18 DIAGNOSIS — Z170 Estrogen receptor positive status [ER+]: Secondary | ICD-10-CM | POA: Insufficient documentation

## 2015-03-18 DIAGNOSIS — R0683 Snoring: Secondary | ICD-10-CM | POA: Insufficient documentation

## 2015-03-18 DIAGNOSIS — Z9049 Acquired absence of other specified parts of digestive tract: Secondary | ICD-10-CM | POA: Insufficient documentation

## 2015-03-18 DIAGNOSIS — D0512 Intraductal carcinoma in situ of left breast: Secondary | ICD-10-CM | POA: Diagnosis not present

## 2015-03-18 DIAGNOSIS — F419 Anxiety disorder, unspecified: Secondary | ICD-10-CM | POA: Insufficient documentation

## 2015-03-18 DIAGNOSIS — N926 Irregular menstruation, unspecified: Secondary | ICD-10-CM | POA: Insufficient documentation

## 2015-03-18 DIAGNOSIS — M1990 Unspecified osteoarthritis, unspecified site: Secondary | ICD-10-CM | POA: Insufficient documentation

## 2015-03-18 DIAGNOSIS — K219 Gastro-esophageal reflux disease without esophagitis: Secondary | ICD-10-CM | POA: Insufficient documentation

## 2015-03-18 DIAGNOSIS — R030 Elevated blood-pressure reading, without diagnosis of hypertension: Secondary | ICD-10-CM | POA: Insufficient documentation

## 2015-03-18 DIAGNOSIS — N951 Menopausal and female climacteric states: Secondary | ICD-10-CM | POA: Insufficient documentation

## 2015-03-18 DIAGNOSIS — Z6841 Body Mass Index (BMI) 40.0 and over, adult: Secondary | ICD-10-CM | POA: Insufficient documentation

## 2015-03-18 DIAGNOSIS — R351 Nocturia: Secondary | ICD-10-CM | POA: Insufficient documentation

## 2015-03-18 DIAGNOSIS — I10 Essential (primary) hypertension: Secondary | ICD-10-CM | POA: Insufficient documentation

## 2015-03-18 HISTORY — PX: BREAST EXCISIONAL BIOPSY: SUR124

## 2015-03-18 HISTORY — PX: BREAST LUMPECTOMY WITH RADIOACTIVE SEED LOCALIZATION: SHX6424

## 2015-03-18 SURGERY — BREAST LUMPECTOMY WITH RADIOACTIVE SEED LOCALIZATION
Anesthesia: General | Site: Breast | Laterality: Left

## 2015-03-18 MED ORDER — MIDAZOLAM HCL 2 MG/2ML IJ SOLN: 1.0000 mg | INTRAMUSCULAR | Status: DC | PRN

## 2015-03-18 MED ORDER — ONDANSETRON HCL 4 MG/2ML IJ SOLN
INTRAMUSCULAR | Status: AC
  Filled 2015-03-18: qty 2

## 2015-03-18 MED ORDER — CHLORHEXIDINE GLUCONATE 4 % EX LIQD: 1.0000 "application " | Freq: Once | CUTANEOUS | Status: DC

## 2015-03-18 MED ORDER — HYDROMORPHONE HCL 1 MG/ML IJ SOLN
INTRAMUSCULAR | Status: AC
  Filled 2015-03-18: qty 1

## 2015-03-18 MED ORDER — FENTANYL CITRATE (PF) 100 MCG/2ML IJ SOLN
INTRAMUSCULAR | Status: AC
  Filled 2015-03-18: qty 4

## 2015-03-18 MED ORDER — HYDROMORPHONE HCL 1 MG/ML IJ SOLN
0.2500 mg | INTRAMUSCULAR | Status: DC | PRN
  Administered 2015-03-18 (×2): 0.5 mg via INTRAVENOUS

## 2015-03-18 MED ORDER — OXYCODONE HCL 5 MG PO TABS
5.0000 mg | ORAL_TABLET | Freq: Once | ORAL | Status: AC | PRN
  Administered 2015-03-18: 5 mg via ORAL

## 2015-03-18 MED ORDER — LACTATED RINGERS IV SOLN
INTRAVENOUS | Status: DC
  Administered 2015-03-18 (×2): 10 mL/h via INTRAVENOUS

## 2015-03-18 MED ORDER — MIDAZOLAM HCL 2 MG/2ML IJ SOLN
INTRAMUSCULAR | Status: AC
  Filled 2015-03-18: qty 4

## 2015-03-18 MED ORDER — LIDOCAINE HCL (CARDIAC) 20 MG/ML IV SOLN
INTRAVENOUS | Status: AC
  Filled 2015-03-18: qty 5

## 2015-03-18 MED ORDER — ONDANSETRON HCL 4 MG/2ML IJ SOLN
INTRAMUSCULAR | Status: DC | PRN
  Administered 2015-03-18: 4 mg via INTRAVENOUS

## 2015-03-18 MED ORDER — OXYCODONE-ACETAMINOPHEN 5-325 MG PO TABS: 1.0000 | ORAL_TABLET | ORAL | Status: DC | PRN

## 2015-03-18 MED ORDER — GLYCOPYRROLATE 0.2 MG/ML IJ SOLN: 0.2000 mg | Freq: Once | INTRAMUSCULAR | Status: DC | PRN

## 2015-03-18 MED ORDER — BUPIVACAINE-EPINEPHRINE (PF) 0.25% -1:200000 IJ SOLN
INTRAMUSCULAR | Status: DC | PRN
  Administered 2015-03-18: 10 mL

## 2015-03-18 MED ORDER — PROPOFOL 10 MG/ML IV BOLUS
INTRAVENOUS | Status: DC | PRN
  Administered 2015-03-18: 300 mg via INTRAVENOUS

## 2015-03-18 MED ORDER — FENTANYL CITRATE (PF) 100 MCG/2ML IJ SOLN: 50.0000 ug | INTRAMUSCULAR | Status: DC | PRN

## 2015-03-18 MED ORDER — LIDOCAINE HCL (CARDIAC) 20 MG/ML IV SOLN
INTRAVENOUS | Status: DC | PRN
  Administered 2015-03-18: 50 mg via INTRAVENOUS

## 2015-03-18 MED ORDER — DEXAMETHASONE SODIUM PHOSPHATE 10 MG/ML IJ SOLN
INTRAMUSCULAR | Status: AC
  Filled 2015-03-18: qty 1

## 2015-03-18 MED ORDER — CEFAZOLIN SODIUM-DEXTROSE 2-3 GM-% IV SOLR
INTRAVENOUS | Status: AC
  Filled 2015-03-18: qty 50

## 2015-03-18 MED ORDER — DEXTROSE 5 % IV SOLN
3.0000 g | INTRAVENOUS | Status: AC
  Administered 2015-03-18: 3 g via INTRAVENOUS

## 2015-03-18 MED ORDER — OXYCODONE HCL 5 MG/5ML PO SOLN: 5.0000 mg | Freq: Once | ORAL | Status: AC | PRN

## 2015-03-18 MED ORDER — DEXAMETHASONE SODIUM PHOSPHATE 4 MG/ML IJ SOLN
INTRAMUSCULAR | Status: DC | PRN
  Administered 2015-03-18: 10 mg via INTRAVENOUS

## 2015-03-18 MED ORDER — GLYCOPYRROLATE 0.2 MG/ML IJ SOLN
INTRAMUSCULAR | Status: AC
  Filled 2015-03-18: qty 1

## 2015-03-18 MED ORDER — MIDAZOLAM HCL 5 MG/5ML IJ SOLN
INTRAMUSCULAR | Status: DC | PRN
  Administered 2015-03-18: 2 mg via INTRAVENOUS

## 2015-03-18 MED ORDER — SCOPOLAMINE 1 MG/3DAYS TD PT72: 1.0000 | MEDICATED_PATCH | Freq: Once | TRANSDERMAL | Status: DC | PRN

## 2015-03-18 MED ORDER — GLYCOPYRROLATE 0.2 MG/ML IJ SOLN
INTRAMUSCULAR | Status: DC | PRN
  Administered 2015-03-18: 0.2 mg via INTRAVENOUS

## 2015-03-18 MED ORDER — MEPERIDINE HCL 25 MG/ML IJ SOLN: 6.2500 mg | INTRAMUSCULAR | Status: DC | PRN

## 2015-03-18 MED ORDER — OXYCODONE HCL 5 MG PO TABS
ORAL_TABLET | ORAL | Status: AC
  Filled 2015-03-18: qty 1

## 2015-03-18 MED ORDER — FENTANYL CITRATE (PF) 100 MCG/2ML IJ SOLN
INTRAMUSCULAR | Status: DC | PRN
  Administered 2015-03-18: 100 ug via INTRAVENOUS
  Administered 2015-03-18: 50 ug via INTRAVENOUS

## 2015-03-18 MED ORDER — CEFAZOLIN SODIUM 1-5 GM-% IV SOLN
INTRAVENOUS | Status: AC
  Filled 2015-03-18: qty 50

## 2015-03-18 SURGICAL SUPPLY — 49 items
APPLIER CLIP 9.375 MED OPEN (MISCELLANEOUS) ×3
BINDER BREAST LRG (GAUZE/BANDAGES/DRESSINGS) IMPLANT
BINDER BREAST MEDIUM (GAUZE/BANDAGES/DRESSINGS) IMPLANT
BINDER BREAST XLRG (GAUZE/BANDAGES/DRESSINGS) IMPLANT
BINDER BREAST XXLRG (GAUZE/BANDAGES/DRESSINGS) IMPLANT
BLADE SURG 15 STRL LF DISP TIS (BLADE) ×1 IMPLANT
BLADE SURG 15 STRL SS (BLADE) ×2
CANISTER SUC SOCK COL 7IN (MISCELLANEOUS) ×3 IMPLANT
CANISTER SUCT 1200ML W/VALVE (MISCELLANEOUS) IMPLANT
CHLORAPREP W/TINT 26ML (MISCELLANEOUS) ×3 IMPLANT
CLIP APPLIE 9.375 MED OPEN (MISCELLANEOUS) ×1 IMPLANT
COVER BACK TABLE 60X90IN (DRAPES) ×3 IMPLANT
COVER MAYO STAND STRL (DRAPES) ×3 IMPLANT
COVER PROBE W GEL 5X96 (DRAPES) ×3 IMPLANT
DECANTER SPIKE VIAL GLASS SM (MISCELLANEOUS) IMPLANT
DEVICE DUBIN W/COMP PLATE 8390 (MISCELLANEOUS) ×3 IMPLANT
DRAPE LAPAROSCOPIC ABDOMINAL (DRAPES) IMPLANT
DRAPE LAPAROTOMY 100X72 PEDS (DRAPES) ×3 IMPLANT
DRAPE UTILITY XL STRL (DRAPES) ×3 IMPLANT
ELECT COATED BLADE 2.86 ST (ELECTRODE) ×3 IMPLANT
ELECT REM PT RETURN 9FT ADLT (ELECTROSURGICAL) ×3
ELECTRODE REM PT RTRN 9FT ADLT (ELECTROSURGICAL) ×1 IMPLANT
GLOVE BIO SURGEON STRL SZ 6.5 (GLOVE) ×2 IMPLANT
GLOVE BIO SURGEONS STRL SZ 6.5 (GLOVE) ×1
GLOVE BIOGEL PI IND STRL 7.0 (GLOVE) ×2 IMPLANT
GLOVE BIOGEL PI IND STRL 8 (GLOVE) ×1 IMPLANT
GLOVE BIOGEL PI INDICATOR 7.0 (GLOVE) ×4
GLOVE BIOGEL PI INDICATOR 8 (GLOVE) ×2
GLOVE ECLIPSE 8.0 STRL XLNG CF (GLOVE) ×3 IMPLANT
GOWN STRL REUS W/ TWL LRG LVL3 (GOWN DISPOSABLE) ×2 IMPLANT
GOWN STRL REUS W/TWL LRG LVL3 (GOWN DISPOSABLE) ×4
HEMOSTAT SNOW SURGICEL 2X4 (HEMOSTASIS) IMPLANT
KIT MARKER MARGIN INK (KITS) ×3 IMPLANT
LIQUID BAND (GAUZE/BANDAGES/DRESSINGS) ×3 IMPLANT
NEEDLE HYPO 25X1 1.5 SAFETY (NEEDLE) ×3 IMPLANT
NS IRRIG 1000ML POUR BTL (IV SOLUTION) ×3 IMPLANT
PACK BASIN DAY SURGERY FS (CUSTOM PROCEDURE TRAY) ×3 IMPLANT
PENCIL BUTTON HOLSTER BLD 10FT (ELECTRODE) ×3 IMPLANT
SLEEVE SCD COMPRESS KNEE MED (MISCELLANEOUS) ×3 IMPLANT
SPONGE LAP 4X18 X RAY DECT (DISPOSABLE) ×3 IMPLANT
SUT MNCRL AB 4-0 PS2 18 (SUTURE) ×3 IMPLANT
SUT SILK 2 0 SH (SUTURE) IMPLANT
SUT VICRYL 3-0 CR8 SH (SUTURE) ×3 IMPLANT
SYR CONTROL 10ML LL (SYRINGE) ×3 IMPLANT
TOWEL OR 17X24 6PK STRL BLUE (TOWEL DISPOSABLE) ×3 IMPLANT
TOWEL OR NON WOVEN STRL DISP B (DISPOSABLE) ×3 IMPLANT
TUBE CONNECTING 20'X1/4 (TUBING)
TUBE CONNECTING 20X1/4 (TUBING) IMPLANT
YANKAUER SUCT BULB TIP NO VENT (SUCTIONS) IMPLANT

## 2015-03-18 NOTE — Op Note (Signed)
eoperative diagnosis: Left breast DCIS   Postoperative diagnosis: Same   Procedure: Left breast seed localized PARTIAL MASTECTOMY   Surgeon: Erroll Luna M.D.  Anesthesia: Gen. With 0.25% Sensorcaine local with epinephrine   EBL: 20 cc  Specimen: Left breast tissue with clip and radioactive seed in the specimen. Verified with neoprobe and radiographic image showing both seed and clip in specimen  Indications for procedure: The patient presents for left breast seed localized partial mastectomy  after core biopsy showed DCIS. Discussed the rationale for considering PARTIAL MASTECTOMY.The procedure has been discussed with the patient.  DISCUSED MASTECTOMY AND RECONSTRUCTION .  Alternatives to surgery have been discussed with the patient.  Risks of surgery include bleeding,  Infection,  Seroma formation, death, COSMETIC ISSUES    and the need for further surgery.   The patient understands and wishes to proceed.  Description of procedure: Patient underwent seed placement as an outpatient. Patient presents today for left breast seed localized PARTIAL MASTECTOMY. Patient seen in  holding area. Questions are answered and neoprobe used to verify seed location. Patient taken back to the operating room and placed upon the OR table. After induction of general anesthesia, left breast prepped and draped in a sterile fashion. Timeout was done to verify proper procedure. Neoprobe used and hot spot identified and left breast upper-outer quadrant. This was marked with pen. Curvilinear incision made left upper outer quadrant breast. Dissection used with the help of a neoprobe around the tissue where the seed and clip were located. Tissue removed in its entirety with gross negative  Margins.Additional medial margin removed.   Neoprobe used and seed within specimen. Radiographs taken which show clip and seed  In specimen.Hemostasis achieved and cavity closed with 3-0 Vicryl and 4-0 Monocryl. Liquid adhesive  applied.  All final counts found to be correct. Specimen transported to pathology. Patient awoke extubated taken to recovery in satisfactory condition.

## 2015-03-18 NOTE — Anesthesia Procedure Notes (Signed)
Procedure Name: LMA Insertion Date/Time: 03/18/2015 11:13 AM Performed by: Lieutenant Diego Pre-anesthesia Checklist: Patient identified, Emergency Drugs available, Suction available, Patient being monitored and Timeout performed Patient Re-evaluated:Patient Re-evaluated prior to inductionOxygen Delivery Method: Circle System Utilized Preoxygenation: Pre-oxygenation with 100% oxygen Intubation Type: IV induction Ventilation: Mask ventilation without difficulty LMA: LMA inserted LMA Size: 4.0 Number of attempts: 1 Airway Equipment and Method: Bite block Placement Confirmation: positive ETCO2 Tube secured with: Tape Dental Injury: Teeth and Oropharynx as per pre-operative assessment

## 2015-03-18 NOTE — Anesthesia Preprocedure Evaluation (Signed)
Anesthesia Evaluation  Patient identified by MRN, date of birth, ID band Patient awake    Reviewed: Allergy & Precautions, NPO status , Patient's Chart, lab work & pertinent test results  Airway Mallampati: I  TM Distance: >3 FB Neck ROM: Full    Dental  (+) Teeth Intact, Dental Advisory Given   Pulmonary    breath sounds clear to auscultation       Cardiovascular hypertension, Pt. on medications  Rhythm:Regular Rate:Normal     Neuro/Psych    GI/Hepatic GERD  Medicated and Controlled,  Endo/Other  Morbid obesity  Renal/GU      Musculoskeletal   Abdominal   Peds  Hematology   Anesthesia Other Findings   Reproductive/Obstetrics                             Anesthesia Physical Anesthesia Plan  ASA: II  Anesthesia Plan: General   Post-op Pain Management:    Induction: Intravenous  Airway Management Planned: LMA  Additional Equipment:   Intra-op Plan:   Post-operative Plan: Extubation in OR  Informed Consent: I have reviewed the patients History and Physical, chart, labs and discussed the procedure including the risks, benefits and alternatives for the proposed anesthesia with the patient or authorized representative who has indicated his/her understanding and acceptance.   Dental advisory given  Plan Discussed with: CRNA, Anesthesiologist and Surgeon  Anesthesia Plan Comments:         Anesthesia Quick Evaluation

## 2015-03-18 NOTE — H&P (View-Only) (Signed)
Elizabeth Hoffman 02/24/2015 8:10 AM Location: [facility] Patient #: [id number] DOB: Oct 03, 1970 Undefined / Language: Cleophus Molt / Race: White Female History of Present Illness Marcello Moores A. Tippi Mccrae MD; 02/24/2015 3:04 PM) Patient words: patient sent at the request of Dr. Lisbeth Renshaw for left breast microcalcifications measuring 3 cm. 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 No family history of breast cancer.She denies any breaat luof either breast.arge or change in the appearance of either breast. No history of breast biopsy.             CLINICAL DATA: Screening.  EXAM: DIGITAL SCREENING BILATERAL MAMMOGRAM WITH CAD  COMPARISON: Previous exam(s).  ACR Breast Density Category b: There are scattered areas of fibroglandular density.  FINDINGS: In the left breast, calcifications warrant further evaluation. In the right breast, no findings suspicious for malignancy. Images were processed with CAD.  IMPRESSION: Further evaluation is suggested for calcifications in the left breast.  RECOMMENDATION: Diagnostic mammogram of the left breast. (Code:[id number])  The patient will be contacted regarding the findings, and additional imaging will be scheduled.  BI-RADS CATEGORY 0: Incomplete. Need additional imaging evaluation and/or prior mammograms for comparison.   Electronically Signed By: Margarette Canada M.D. On: 02/09/2015 15:56       CLINICAL DATA: Patient is status post stereotactic biopsy of calcifications posterior left breast upper outer quadrant EXAM: DIAGNOSTIC LEFT MAMMOGRAM POST STEREOTACTIC BIOPSY COMPARISON: Previous exam(s). FINDINGS: Mammographic images were obtained following stereotactic guided biopsy of calcifications in the upper-outer quadrant of the left breast. Marker clip projects in appropriate position over the cluster of calcifications. IMPRESSION: Appropriate clip position Final Assessment:  Post Procedure Mammograms for Marker Placement Electronically Signed By: Skipper Cliche M.D. 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 Lyndon Code. The [facility] has been informed. (02/19/2015). ER and PR has been ordered and will issue as an addendum. 1 of 2 FINAL for Elizabeth Hoffman, Elizabeth Hoffman ([id number]) Casimer Lanius MD Pathologist, Electronic Signature (Case signed 02/19/2015) Specimen Gross and Clinical Information.  The patient is a 44 year old female   Other Problems Conni Slipper, RN; 02/24/2015 8:10 AM) Anxiety Disorder Arthritis Breast Cancer Cholelithiasis Gastroesophageal Reflux Disease High blood pressure Lump In Breast Umbilical Hernia Repair  Past Surgical History Conni Slipper, RN; 02/24/2015 8:10 AM) Breast Biopsy Left. Gallbladder Surgery - Laparoscopic Oral Surgery Tonsillectomy Ventral / Umbilical Hernia Surgery Bilateral.  Diagnostic Studies History Conni Slipper, RN; 02/24/2015 8:10 AM) Colonoscopy never Mammogram within last year  Medication History Conni Slipper, RN; 02/24/2015 8:10 AM) No Current Medications Medications Reconciled  Social History Conni Slipper, RN; 02/24/2015 8:10 AM) Alcohol use  Remotely quit alcohol use. Caffeine use Coffee. Tobacco use Never smoker.  Family History Conni Slipper, RN; 02/24/2015 8:10 AM) Family history unknown First Degree Relatives  Pregnancy / Birth History Conni Slipper, RN; 02/24/2015 8:10 AM) Age at menarche 86 years. Contraceptive History Contraceptive implant, Oral contraceptives. Gravida 3 Irregular periods Maternal age 80-30 Para 3  Review of Systems Conni Slipper RN; 02/24/2015 8:10 AM) General Not Present- Appetite Loss, Chills, Fatigue, Fever, Night Sweats, Weight Gain and Weight Loss. Skin Not Present- Change in Wart/Mole, Dryness, Hives, Jaundice, New Lesions, Non-Healing Wounds, Rash and Ulcer. HEENT Not Present- Earache, Hearing Loss, Hoarseness, Nose Bleed, Oral Ulcers, Ringing in the Ears, Seasonal Allergies, Sinus Pain, Sore Throat, Visual Disturbances, Wears glasses/contact lenses and Yellow Eyes. Respiratory Present- Snoring. Not Present- Bloody sputum, Chronic Cough, Difficulty Breathing and Wheezing. Breast Not Present- Breast Mass, Breast Pain, Nipple Discharge and Skin Changes. Cardiovascular Not Present- Chest Pain, Difficulty Breathing Lying Down, Leg Cramps, Palpitations, Rapid Heart Rate, Shortness of Breath and Swelling of Extremities. Gastrointestinal Not Present- Abdominal Pain, Bloating, Bloody Stool, Change in Bowel Habits, Chronic diarrhea, Constipation, Difficulty Swallowing, Excessive gas, Gets full quickly at meals, Hemorrhoids, Indigestion, Nausea, Rectal Pain and Vomiting. Female Genitourinary Present- Nocturia. Not Present- Frequency, Painful Urination, Pelvic Pain and Urgency. Musculoskeletal Not Present- Back Pain, Joint Pain, Joint Stiffness, Muscle Pain, Muscle Weakness and Swelling of Extremities. Neurological Not Present- Decreased Memory, Fainting, Headaches, Numbness, Seizures, Tingling, Tremor, Trouble walking and Weakness. Psychiatric Present- Anxiety. Not Present- Bipolar, Change in  Sleep Pattern, Depression, Fearful and Frequent crying. Endocrine Present- Hot flashes. Not Present- Cold Intolerance, Excessive Hunger, Hair Changes, Heat Intolerance and New Diabetes. Hematology Not Present- Easy Bruising, Excessive bleeding, Gland problems, HIV and Persistent Infections.   Physical Exam (Kaelea Gathright A. Adaliz Dobis MD; 02/24/2015 3:04 PM)  General Mental Status-Alert. General Appearance-Consistent with stated age. Hydration-Well hydrated. Voice-Normal.  Integumentary Note: tattoo left breast   Head and Neck Head-normocephalic, atraumatic with no lesions or palpable masses. Trachea-midline. Thyroid Gland Characteristics - normal size and consistency.  Eye Eyeball - Bilateral-Extraocular movements intact. Sclera/Conjunctiva - Bilateral-No scleral icterus.  Chest and Lung Exam Chest and lung exam reveals -quiet, even and easy respiratory effort with no use of accessory muscles and on auscultation, normal breath sounds, no adventitious sounds and normal vocal resonance. Inspection Chest Wall - Normal. Back - normal.  Breast Breast - Left-Symmetric, Non Tender, No Biopsy scars, no Dimpling, No Inflammation, No Lumpectomy scars, No Mastectomy scars, No Peau d' Orange. Breast - Right-Symmetric, Non Tender, No Biopsy scars, no Dimpling, No Inflammation, No Lumpectomy scars, No Mastectomy scars, No Peau d' Orange. Breast Lump-No Palpable Breast Mass.  Cardiovascular Cardiovascular examination reveals -normal heart sounds, regular rate and rhythm with no murmurs and normal pedal pulses bilaterally.  Neurologic Neurologic evaluation reveals -alert and oriented x 3 with no impairment of recent or remote memory. Mental Status-Normal.  Lymphatic Axillary  General Axillary Region: Bilateral - Description - Normal. Tenderness - Non Tender.    Assessment & Plan (Calina Patrie A. Carmin Alvidrez MD; 02/24/2015 3:06 PM)  DCIS (DUCTAL CARCINOMA IN SITU),  LEFT (D05.12) Impression: DISCUSSED TREATMENT OPTIONS FOR SURGERY TO INCLUDE PARTIAL MASTECTOMY VERSUS MASTECTOMY. Patient is opted for genetics as well. She would like to proceed with left breast partial mastectomy after genetics if she is not a carrier. Risk of lumpectomy include bleeding, infection, seroma, more surgery, use of seed/wire, wound care, cosmetic deformity and the need for other treatments, death , blood clots, death. Pt agrees to proceed.  Current Plans   The anatomy and the physiology was discussed. The pathophysiology and natural history of the disease was discussed. Options were discussed and recommendations were made. Technique, risks, benefits, & alternatives were discussed. Risks such as stroke, heart attack, bleeding, indection, death, and other risks discussed. Questions answered. The patient agrees to proceed. Pt Education -  [facility] Breast Cancer Information Given - [employer] "Breast Journey" Package Pt Education - [facility] Breast Biopsy HCI: discussed with patient and provided information.

## 2015-03-18 NOTE — Discharge Instructions (Signed)
[place] Office Phone Number [phone]  BREAST BIOPSY/ PARTIAL MASTECTOMY: POST OP INSTRUCTIONS  Always review your discharge instruction sheet given to you by the facility where your surgery was performed.  IF YOU HAVE DISABILITY OR FAMILY LEAVE FORMS, YOU MUST BRING THEM TO THE OFFICE FOR PROCESSING.  DO NOT GIVE THEM TO YOUR DOCTOR.  1. A prescription for pain medication may be given to you upon discharge.  Take your pain medication as prescribed, if needed.  If narcotic pain medicine is not needed, then you may take acetaminophen (Tylenol) or ibuprofen (Advil) as needed. 2. Take your usually prescribed medications unless otherwise directed 3. If you need a refill on your pain medication, please contact your pharmacy.  They will contact our office to request authorization.  Prescriptions will not be filled after 5pm or on week-ends. 4. You should eat very light the first 24 hours after surgery, such as soup, crackers, pudding, etc.  Resume your normal diet the day after surgery. 5. Most patients will experience some swelling and bruising in the breast.  Ice packs and a good support bra will help.  Swelling and bruising can take several days to resolve.  6. It is common to experience some constipation if taking pain medication after surgery.  Increasing fluid intake and taking a stool softener will usually help or prevent this problem from occurring.  A mild laxative (Milk of Magnesia or Miralax) should be taken according to package directions if there are no bowel movements after 48 hours. 7. Unless discharge instructions indicate otherwise, you may remove your bandages 24-48 hours after surgery, and you may shower at that time.  You may have steri-strips (small skin tapes) in place directly over the incision.  These strips should be left on the skin for 7-10 days.  If your surgeon used skin glue on the incision, you may shower in 24 hours.  The glue will flake off over the  next 2-3 weeks.  Any sutures or staples will be removed at the office during your follow-up visit. 8. ACTIVITIES:  You may resume regular daily activities (gradually increasing) beginning the next day.  Wearing a good support bra or sports bra minimizes pain and swelling.  You may have sexual intercourse when it is comfortable. a. You may drive when you no longer are taking prescription pain medication, you can comfortably wear a seatbelt, and you can safely maneuver your car and apply brakes. b. RETURN TO WORK:  ______________________________________________________________________________________ 9. You should see your doctor in the office for a follow-up appointment approximately two weeks after your surgery.  Your doctors nurse will typically make your follow-up appointment when she calls you with your pathology report.  Expect your pathology report 2-3 business days after your surgery.  You may call to check if you do not hear from [place] after three days. 10. OTHER INSTRUCTIONS: _______________________________________________________________________________________________ _____________________________________________________________________________________________________________________________________ _____________________________________________________________________________________________________________________________________ _____________________________________________________________________________________________________________________________________  WHEN TO CALL YOUR DOCTOR: 1. Fever over 101.0 2. Nausea and/or vomiting. 3. Extreme swelling or bruising. 4. Continued bleeding from incision. 5. Increased pain, redness, or drainage from the incision.  The clinic staff is available to answer your questions during regular business hours.  Please dont hesitate to call and ask to speak to one of the nurses for clinical concerns.  If you have a medical emergency, go to the nearest  emergency room or call 911.  A surgeon from [facility] is always on call at the hospital.  For further questions, please visit [email or web address]  Post Anesthesia Home Care Instructions ° °Activity: °Get plenty of rest for the remainder of the day. A responsible adult should stay with you for 24 hours following the procedure.  °For the next 24 hours, DO NOT: °-Drive a car °-Operate machinery °-Drink alcoholic beverages °-Take any medication unless instructed by your physician °-Make any legal decisions or sign important papers. ° °Meals: °Start with liquid foods such as gelatin or soup. Progress to regular foods as tolerated. Avoid greasy, spicy, heavy foods. If nausea and/or vomiting occur, drink only clear liquids until the nausea and/or vomiting subsides. Call your physician if vomiting continues. ° °Special Instructions/Symptoms: °Your throat may feel dry or sore from the anesthesia or the breathing tube placed in your throat during surgery. If this causes discomfort, gargle with warm salt water. The discomfort should disappear within 24 hours. ° °If you had a scopolamine patch placed behind your ear for the management of post- operative nausea and/or vomiting: ° °1. The medication in the patch is effective for 72 hours, after which it should be removed.  Wrap patch in a tissue and discard in the trash. Wash hands thoroughly with soap and water. °2. You may remove the patch earlier than 72 hours if you experience unpleasant side effects which may include dry mouth, dizziness or visual disturbances. °3. Avoid touching the patch. Wash your hands with soap and water after contact with the patch. °  ° °

## 2015-03-18 NOTE — Transfer of Care (Signed)
Immediate Anesthesia Transfer of Care Note  Patient: Elizabeth Hoffman  Procedure(s) Performed: Procedure(s): BREAST LUMPECTOMY WITH RADIOACTIVE SEED LOCALIZATION (Left)  Patient Location: PACU  Anesthesia Type:General  Level of Consciousness: awake and alert   Airway & Oxygen Therapy: Patient Spontanous Breathing and Patient connected to face mask oxygen  Post-op Assessment: Report given to RN and Post -op Vital signs reviewed and stable  Post vital signs: Reviewed and stable  Last Vitals:  Filed Vitals:   03/18/15 1024  BP: 128/70  Pulse: 66  Temp: 36.8 C  Resp: 20    Complications: No apparent anesthesia complications

## 2015-03-18 NOTE — Anesthesia Postprocedure Evaluation (Signed)
  Anesthesia Post-op Note  Patient: Elizabeth Hoffman  Procedure(s) Performed: Procedure(s): BREAST LUMPECTOMY WITH RADIOACTIVE SEED LOCALIZATION (Left)  Patient Location: PACU  Anesthesia Type: General   Level of Consciousness: awake, alert  and oriented  Airway and Oxygen Therapy: Patient Spontanous Breathing  Post-op Pain: mild  Post-op Assessment: Post-op Vital signs reviewed  Post-op Vital Signs: Reviewed  Last Vitals:  Filed Vitals:   03/18/15 1320  BP: 150/81  Pulse: 88  Temp: 37.1 C  Resp: 16    Complications: No apparent anesthesia complications

## 2015-03-18 NOTE — Interval H&P Note (Signed)
History and Physical Interval Note:  03/18/2015 11:01 AM  Elizabeth Hoffman  has presented today for surgery, with the diagnosis of Left Breast Calcifications  The various methods of treatment have been discussed with the patient and family. After consideration of risks, benefits and other options for treatment, the patient has consented to  Procedure(s): BREAST LUMPECTOMY WITH RADIOACTIVE SEED LOCALIZATION (Left) as a surgical intervention .  The patient's history has been reviewed, patient examined, no change in status, stable for surgery.  I have reviewed the patient's chart and labs.  Questions were answered to the patient's satisfaction.     Elizabeth Hoffman A.

## 2015-03-19 ENCOUNTER — Encounter: Payer: Self-pay | Admitting: Family Medicine

## 2015-03-25 ENCOUNTER — Encounter: Payer: Self-pay | Admitting: Radiation Oncology

## 2015-03-25 ENCOUNTER — Ambulatory Visit
Admission: RE | Admit: 2015-03-25 | Discharge: 2015-03-25 | Disposition: A | Discharge status: Home / Self Care | Payer: Medicaid Other | Source: Ambulatory Visit | Attending: Radiation Oncology | Admitting: Radiation Oncology

## 2015-03-25 ENCOUNTER — Other Ambulatory Visit: Payer: Self-pay | Admitting: Oncology

## 2015-03-25 ENCOUNTER — Other Ambulatory Visit: Payer: Self-pay | Admitting: *Deleted

## 2015-03-25 VITALS — BP 122/80 | HR 80 | Temp 99.2°F | Resp 16 | Ht 69.0 in | Wt 354.1 lb

## 2015-03-25 DIAGNOSIS — Z170 Estrogen receptor positive status [ER+]: Secondary | ICD-10-CM | POA: Insufficient documentation

## 2015-03-25 DIAGNOSIS — Z510 Encounter for antineoplastic radiation therapy: Secondary | ICD-10-CM | POA: Insufficient documentation

## 2015-03-25 DIAGNOSIS — D0512 Intraductal carcinoma in situ of left breast: Secondary | ICD-10-CM | POA: Insufficient documentation

## 2015-03-25 MED ORDER — CEPHALEXIN 500 MG PO CAPS: 500.0000 mg | ORAL_CAPSULE | Freq: Three times a day (TID) | ORAL | Status: DC

## 2015-03-25 NOTE — Progress Notes (Signed)
Patient here for consult.

## 2015-03-25 NOTE — Consult Note (Signed)
Except an outstanding is perfect of Radiation Oncology NEW PATIENT EVALUATION  Name: Elizabeth Hoffman  MRN: [id number]  Date:   03/25/2015     DOB: 05-06-1971   This 44 y.o. 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  REFERRING PHYSICIAN: Erroll Luna, MD  CHIEF COMPLAINT:  Chief Complaint  Patient presents with  . Breast Cancer    consult from Dr. Alvia Grove s/p surgery.    DIAGNOSIS: The encounter diagnosis was DCIS (ductal carcinoma in situ) of breast, left.   PREVIOUS INVESTIGATIONS:  Mammograms are reviewed Surgical pathology report is reviewed Clinical notes reviewed  HPI: Patient is a pleasant 44 year old female who presented with an abnormal mammogram showing a new calcifications in left breast in the upper outer quadrant extending and over an area of approximately 3.1 cm. Biopsy was performed showing ductal carcinoma in situ intermediate grade with comedo necrosis. Tumor was ER/PR positive. Patient underwent wide local excision showing again intermediate grade ductal carcinoma in situ with comedonecrosis. Several margins were close but on reexcision margins were negative. She's had some significant left breast pain after her surgery which was last week. She also states she believes she is running a low-grade fever. She seen today for consideration of radiation therapy in an adjuvant fashion.  PLANNED TREATMENT REGIMEN: Whole breast radiation  PAST MEDICAL HISTORY:  has a past medical history of Arthritis; Depression; Hypertension; Anxiety; Hot flashes; and Breast cancer ([place]) (2016).    PAST SURGICAL HISTORY:  Past Surgical History  Procedure Laterality Date  . Tonsillectomy and adenoidectomy    . Uvulectomy    . Cholecystectomy    . Hernia repair    . Cervical biopsy  w/ loop electrode excision    . Nasal septum surgery    .  Mastectomy, partial Left 02/15/2015  . Breast lumpectomy with radioactive seed localization Left 03/18/2015    Procedure: BREAST LUMPECTOMY WITH RADIOACTIVE SEED LOCALIZATION;  Surgeon: Erroll Luna, MD;  Location: [facility];  Service: General;  Laterality: Left;    FAMILY HISTORY: family history includes Hyperlipidemia in her father and mother; Hypertension in her father and mother. She was adopted.  SOCIAL HISTORY:  reports that she has never smoked. She does not have any smokeless tobacco history on file. She reports that she does not drink alcohol or use illicit drugs.  ALLERGIES: Review of patient's allergies indicates no known allergies.  MEDICATIONS:  Current Outpatient Prescriptions  Medication Sig Dispense Refill  . amLODipine (NORVASC) 5 MG tablet Take 5 mg by mouth daily.    . hydrochlorothiazide (HYDRODIURIL) 25 MG tablet Take 25 mg by mouth daily.    . pantoprazole (PROTONIX) 40 MG tablet 1 (ONE) TABLET DR, ORAL, DAILY 30 tablet 4  . sertraline (ZOLOFT) 100 MG tablet TAKE 1 (ONE) TABLET, ORAL, DAILY 30 tablet 4  . cephALEXin (KEFLEX) 500 MG capsule Take 1 capsule (500 mg total) by mouth 3 (three) times daily. 15 capsule 0  . oxyCODONE-acetaminophen (ROXICET) 5-325 MG tablet Take 1-2 tablets by mouth every 4 (four) hours as needed. (Patient not taking: Reported on 03/25/2015) 30 tablet 0   No current facility-administered medications for this encounter.    ECOG PERFORMANCE STATUS:  0 - Asymptomatic  REVIEW OF SYSTEMS:  Patient denies any weight loss, fatigue, weakness, fever, chills or night sweats. Patient denies any loss of vision, blurred vision. Patient  denies any ringing  of the ears or hearing loss. No irregular heartbeat. Patient denies heart murmur or history of fainting. Patient denies any chest pain or pain radiating to her upper extremities. Patient denies any shortness of breath, difficulty breathing at night, cough or hemoptysis. Patient denies any  swelling in the lower legs. Patient denies any nausea vomiting, vomiting of blood, or coffee ground material in the vomitus. Patient denies any stomach pain. Patient states has had normal bowel movements no significant constipation or diarrhea. Patient denies any dysuria, hematuria or significant nocturia. Patient denies any problems walking, swelling in the joints or loss of balance. Patient denies any skin changes, loss of hair or loss of weight. Patient denies any excessive worrying or anxiety or significant depression. Patient denies any problems with insomnia. Patient denies excessive thirst, polyuria, polydipsia. Patient denies any swollen glands, patient denies easy bruising or easy bleeding. Patient denies any recent infections, allergies or URI. Patient "s visual fields have not changed significantly in recent time.    PHYSICAL EXAM: BP 122/80 mmHg  Pulse 80  Temp(Src) 99.2 F (37.3 C) (Tympanic)  Resp 16  Ht 5' 9"  (1.753 m)  Wt 354 lb 0.9 oz (160.6 kg)  BMI 52.26 kg/m2  LMP 02/18/2015 (Approximate) A well-developed moderately obese female in NAD. Left breast is wide local excision scar which is healing well. No evidence of mastitis is noted. No other dominant mass or nodularity is noted in either breast in 2 positions examined. No supraclavicular or axillary adenopathy is noted bilaterally. Well-developed well-nourished patient in NAD. HEENT reveals PERLA, EOMI, discs not visualized.  Oral cavity is clear. No oral mucosal lesions are identified. Neck is clear without evidence of cervical or supraclavicular adenopathy. Lungs are clear to A&P. Cardiac examination is essentially unremarkable with regular rate and rhythm without murmur rub or thrill. Abdomen is benign with no organomegaly or masses noted. Motor sensory and DTR levels are equal and symmetric in the upper and lower extremities. Cranial nerves II through XII are grossly intact. Proprioception is intact. No peripheral adenopathy or  edema is identified. No motor or sensory levels are noted. Crude visual fields are within normal range.  LABORATORY DATA: Pathology reports reviewed    RADIOLOGY RESULTS: Mammograms and ultrasound are reviewed   IMPRESSION: Ductal carcinoma in situ of the left breast status post wide local excision in 44 year old female  PLAN: At this time I have recommended whole breast radiation. Recent patient's large breast size she is not a candidate for hyperfractionated treatment. Would plan on delivering 5000 cGy over 5 weeks. Based on her close scar would also boost another 1600 cGy using electron beam. Risks and benefits of treatment including skin reaction, fatigue, alteration of blood counts, possible inclusion of superficial lung and heart all were discussed in detail with the patient. I have set up and ordered CT simulation on her in about a week's time to allow for further healing. I'm also starting her empirically on Keflex for 5 days for possible mastitis. Patient knows to call sooner with any concerns. Patient also be candidate for tamoxifen therapy after completion of radiation.  I would like to take this opportunity for allowing me to participate in the care of your patient.Armstead Peaks., MD

## 2015-04-05 ENCOUNTER — Ambulatory Visit
Admission: RE | Admit: 2015-04-05 | Discharge: 2015-04-05 | Disposition: A | Discharge status: Home / Self Care | Payer: Medicaid Other | Source: Ambulatory Visit | Attending: Radiation Oncology | Admitting: Radiation Oncology

## 2015-04-05 DIAGNOSIS — Z170 Estrogen receptor positive status [ER+]: Secondary | ICD-10-CM | POA: Diagnosis not present

## 2015-04-05 DIAGNOSIS — Z510 Encounter for antineoplastic radiation therapy: Secondary | ICD-10-CM | POA: Diagnosis not present

## 2015-04-05 DIAGNOSIS — D0512 Intraductal carcinoma in situ of left breast: Secondary | ICD-10-CM | POA: Diagnosis not present

## 2015-04-06 ENCOUNTER — Ambulatory Visit (INDEPENDENT_AMBULATORY_CARE_PROVIDER_SITE_OTHER): Payer: Medicaid Other | Admitting: Family Medicine

## 2015-04-06 ENCOUNTER — Encounter: Payer: Self-pay | Admitting: Family Medicine

## 2015-04-06 VITALS — BP 120/72 | HR 81 | Temp 98.1°F | Resp 16 | Ht 69.0 in | Wt 355.0 lb

## 2015-04-06 DIAGNOSIS — Z23 Encounter for immunization: Secondary | ICD-10-CM | POA: Diagnosis not present

## 2015-04-06 DIAGNOSIS — J0100 Acute maxillary sinusitis, unspecified: Secondary | ICD-10-CM | POA: Insufficient documentation

## 2015-04-06 MED ORDER — AZITHROMYCIN 500 MG PO TABS: 500.0000 mg | ORAL_TABLET | Freq: Every day | ORAL | Status: DC

## 2015-04-06 NOTE — Patient Instructions (Signed)

## 2015-04-06 NOTE — Progress Notes (Signed)
Name: Elizabeth Hoffman   MRN: [id number]    DOB: 1971/03/31   Date:04/06/2015       Progress Note  Subjective  Chief Complaint  Chief Complaint  Patient presents with  . Sinusitis    Onset 03/31/15    HPI  Patient is here today with concerns regarding the following symptoms sore throat, ear pressure, sinus pressure and low grade fevers that started weeks ago.  Associated with green mucous from nose, foul smell, top teeth pain. Not associated with headache, nausea. Has tried the following home remedies: Has left over Amoxicillin 500 mg and used it twice a day for 4 days which did not help, otherwise using nasal saline spray and suction.   S/P left breast lumpectomy for DCIS, planning on starting radiation therapy soon. Doing well.  Past Medical History  Diagnosis Date  . Arthritis   . Depression   . Hypertension   . Anxiety   . Hot flashes   . Breast cancer ([place]) 2016    Social History  Substance Use Topics  . Smoking status: Never Smoker   . Smokeless tobacco: Not on file  . Alcohol Use: No     Current outpatient prescriptions:  .  amLODipine (NORVASC) 5 MG tablet, Take 5 mg by mouth daily., Disp: , Rfl:  .  hydrochlorothiazide (HYDRODIURIL) 25 MG tablet, Take 25 mg by mouth daily., Disp: , Rfl:  .  pantoprazole (PROTONIX) 40 MG tablet, 1 (ONE) TABLET DR, ORAL, DAILY, Disp: 30 tablet, Rfl: 4 .  sertraline (ZOLOFT) 100 MG tablet, TAKE 1 (ONE) TABLET, ORAL, DAILY, Disp: 30 tablet, Rfl: 4 .  cephALEXin (KEFLEX) 500 MG capsule, Take 1 capsule (500 mg total) by mouth 3 (three) times daily. (Patient not taking: Reported on 04/06/2015), Disp: 15 capsule, Rfl: 0 .  oxyCODONE-acetaminophen (ROXICET) 5-325 MG tablet, Take 1-2 tablets by mouth every 4 (four) hours as needed. (Patient not taking: Reported on 03/25/2015), Disp: 30 tablet, Rfl: 0  No Known Allergies  ROS  Positive for fatigue, nasal congestion, sinus pressure, ear fullness as mentioned in HPI, otherwise all systems  reviewed and are negative.  Objective  Filed Vitals:   04/06/15 0937  BP: 120/72  Pulse: 81  Temp: 98.1 F (36.7 C)  TempSrc: Oral  Resp: 16  Height: 5' 9"  (1.753 m)  Weight: 355 lb (161.027 kg)  SpO2: 93%   Body mass index is 52.4 kg/(m^2).   Physical Exam  Constitutional: Patient morbidly obese and well-nourished. In no acute distress but does appear to be fatigued from acute illness. HEENT:  - Head: Normocephalic and atraumatic. Tenderness over maxillary sinuses. - Ears: RIGHT TM bulging with minimal clear exudate, LEFT TM bulging with minimal clear exudate.  - Nose: Nasal mucosa boggy and congested.  - Mouth/Throat: Oropharynx is moist with slight erythema of bilateral tonsils without hypertrophy or exudates. Post nasal drainage present.  - Eyes: Conjunctivae clear, EOM movements normal. PERRLA. No scleral icterus.  Neck: Normal range of motion. Neck supple. No JVD present. No thyromegaly present. No local lymphadenopathy. Cardiovascular: Regular rate, regular rhythm with no murmurs heard.  Pulmonary/Chest: Effort normal and breath sounds clear in all lung fields.  Psychiatric: Patient has a normal mood and affect. Behavior is normal in office today. Judgment and thought content normal in office today.   Assessment & Plan  1. Acute maxillary sinusitis, recurrence not specified Etiologies include initial allergic rhinitis or viral infection progressing to superimposed bacterial infection. Instructed patient on increasing hydration, nasal saline  spray, steam inhalation, NSAID if tolerated and not contraindicated. If not already doing so start taking daily anti-histamine and use a steroid nasal spray. If symptoms persist/worsen may consider antibiotic therapy.  - azithromycin (ZITHROMAX) 500 MG tablet; Take 1 tablet (500 mg total) by mouth daily.  Dispense: 10 tablet; Refill: 0  2. Need for immunization against influenza  - Flu Vaccine QUAD 36+ mos PF IM (Fluarix & Fluzone  Quad PF)

## 2015-04-11 ENCOUNTER — Other Ambulatory Visit: Payer: Self-pay | Admitting: Family Medicine

## 2015-04-12 ENCOUNTER — Other Ambulatory Visit: Payer: Self-pay | Admitting: *Deleted

## 2015-04-12 DIAGNOSIS — D0512 Intraductal carcinoma in situ of left breast: Secondary | ICD-10-CM

## 2015-04-13 DIAGNOSIS — Z510 Encounter for antineoplastic radiation therapy: Secondary | ICD-10-CM | POA: Diagnosis not present

## 2015-04-14 ENCOUNTER — Ambulatory Visit
Admission: RE | Admit: 2015-04-14 | Discharge: 2015-04-14 | Disposition: A | Discharge status: Home / Self Care | Payer: Medicaid Other | Source: Ambulatory Visit | Attending: Radiation Oncology | Admitting: Radiation Oncology

## 2015-04-14 DIAGNOSIS — Z510 Encounter for antineoplastic radiation therapy: Secondary | ICD-10-CM | POA: Diagnosis not present

## 2015-04-15 ENCOUNTER — Ambulatory Visit
Admission: RE | Admit: 2015-04-15 | Discharge: 2015-04-15 | Disposition: A | Discharge status: Home / Self Care | Payer: Medicaid Other | Source: Ambulatory Visit | Attending: Radiation Oncology | Admitting: Radiation Oncology

## 2015-04-15 DIAGNOSIS — Z510 Encounter for antineoplastic radiation therapy: Secondary | ICD-10-CM | POA: Diagnosis not present

## 2015-04-16 ENCOUNTER — Encounter: Payer: Self-pay | Admitting: Family Medicine

## 2015-04-16 ENCOUNTER — Ambulatory Visit
Admission: RE | Admit: 2015-04-16 | Discharge: 2015-04-16 | Disposition: A | Discharge status: Home / Self Care | Payer: Medicaid Other | Source: Ambulatory Visit | Attending: Radiation Oncology | Admitting: Radiation Oncology

## 2015-04-16 DIAGNOSIS — Z510 Encounter for antineoplastic radiation therapy: Secondary | ICD-10-CM | POA: Diagnosis not present

## 2015-04-19 ENCOUNTER — Ambulatory Visit
Admission: RE | Admit: 2015-04-19 | Discharge: 2015-04-19 | Disposition: A | Discharge status: Home / Self Care | Payer: Medicaid Other | Source: Ambulatory Visit | Attending: Radiation Oncology | Admitting: Radiation Oncology

## 2015-04-19 DIAGNOSIS — Z510 Encounter for antineoplastic radiation therapy: Secondary | ICD-10-CM | POA: Diagnosis not present

## 2015-04-20 ENCOUNTER — Ambulatory Visit
Admission: RE | Admit: 2015-04-20 | Discharge: 2015-04-20 | Disposition: A | Discharge status: Home / Self Care | Payer: Medicaid Other | Source: Ambulatory Visit | Attending: Radiation Oncology | Admitting: Radiation Oncology

## 2015-04-20 DIAGNOSIS — Z510 Encounter for antineoplastic radiation therapy: Secondary | ICD-10-CM | POA: Diagnosis not present

## 2015-04-21 ENCOUNTER — Ambulatory Visit
Admission: RE | Admit: 2015-04-21 | Discharge: 2015-04-21 | Disposition: A | Discharge status: Home / Self Care | Payer: Medicaid Other | Source: Ambulatory Visit | Attending: Radiation Oncology | Admitting: Radiation Oncology

## 2015-04-21 ENCOUNTER — Telehealth: Payer: Self-pay | Admitting: Family Medicine

## 2015-04-21 ENCOUNTER — Telehealth: Payer: Self-pay | Admitting: *Deleted

## 2015-04-21 ENCOUNTER — Encounter: Payer: Self-pay | Admitting: *Deleted

## 2015-04-21 DIAGNOSIS — Z510 Encounter for antineoplastic radiation therapy: Secondary | ICD-10-CM | POA: Diagnosis not present

## 2015-04-21 NOTE — Telephone Encounter (Signed)
Patient was informed of Dr. Allie Dimmer message via voicemail.

## 2015-04-21 NOTE — Telephone Encounter (Signed)
She has already used amoxicillin 57m twice a day for 4 days before seeing me and I prescribed high doze Azithromycin 500 mg once a day for 10 days. Viral infections do not respond to antibiotics. I recommend conservative management and if that does not help she may need to see ENT instead of excessive use of antibiotics that may build resistance.

## 2015-04-21 NOTE — Telephone Encounter (Signed)
error 

## 2015-04-21 NOTE — Telephone Encounter (Signed)
Pt states she was here a few weeks ago for a sick visit and she has used all her antibiotics and was feeling better, until she used all  Her meds. Pt would like to know if something else can be called in for her. [place].

## 2015-04-21 NOTE — Telephone Encounter (Signed)
Left message for a return phone call to follow up after start of radiation.

## 2015-04-22 ENCOUNTER — Telehealth: Payer: Self-pay | Admitting: Family Medicine

## 2015-04-22 ENCOUNTER — Ambulatory Visit
Admission: RE | Admit: 2015-04-22 | Discharge: 2015-04-22 | Disposition: A | Discharge status: Home / Self Care | Payer: Medicaid Other | Source: Ambulatory Visit | Attending: Radiation Oncology | Admitting: Radiation Oncology

## 2015-04-22 ENCOUNTER — Other Ambulatory Visit: Payer: Self-pay | Admitting: Family Medicine

## 2015-04-22 DIAGNOSIS — R0989 Other specified symptoms and signs involving the circulatory and respiratory systems: Secondary | ICD-10-CM

## 2015-04-22 DIAGNOSIS — Z510 Encounter for antineoplastic radiation therapy: Secondary | ICD-10-CM | POA: Diagnosis not present

## 2015-04-22 NOTE — Telephone Encounter (Signed)
Referral placed.

## 2015-04-22 NOTE — Telephone Encounter (Signed)
PT SAID GO AHEAD AND MAKE THE REFERRAL FOR HER ANYWHERE WITH ANYONE. SAYS THAT SHE HAS SEEN DR Tami Ribas AT [facility]. CAN NOT BE SEEN M -FRI AT THE HOUR OF 1PM FOR SHE HAS OTHER APPT EVERY DAY AT THAT TIME.

## 2015-04-23 ENCOUNTER — Ambulatory Visit
Admission: RE | Admit: 2015-04-23 | Discharge: 2015-04-23 | Disposition: A | Discharge status: Home / Self Care | Payer: Medicaid Other | Source: Ambulatory Visit | Attending: Radiation Oncology | Admitting: Radiation Oncology

## 2015-04-23 ENCOUNTER — Ambulatory Visit: Payer: Medicaid Other | Admitting: Family Medicine

## 2015-04-23 DIAGNOSIS — Z510 Encounter for antineoplastic radiation therapy: Secondary | ICD-10-CM | POA: Diagnosis not present

## 2015-04-26 ENCOUNTER — Ambulatory Visit
Admission: RE | Admit: 2015-04-26 | Discharge: 2015-04-26 | Disposition: A | Discharge status: Home / Self Care | Payer: Medicaid Other | Source: Ambulatory Visit | Attending: Radiation Oncology | Admitting: Radiation Oncology

## 2015-04-26 DIAGNOSIS — Z510 Encounter for antineoplastic radiation therapy: Secondary | ICD-10-CM | POA: Diagnosis not present

## 2015-04-27 ENCOUNTER — Telehealth: Payer: Self-pay | Admitting: Family Medicine

## 2015-04-27 ENCOUNTER — Inpatient Hospital Stay: Payer: Medicaid Other

## 2015-04-27 ENCOUNTER — Ambulatory Visit
Admission: RE | Admit: 2015-04-27 | Discharge: 2015-04-27 | Disposition: A | Discharge status: Home / Self Care | Payer: Medicaid Other | Source: Ambulatory Visit | Attending: Radiation Oncology | Admitting: Radiation Oncology

## 2015-04-27 DIAGNOSIS — Z510 Encounter for antineoplastic radiation therapy: Secondary | ICD-10-CM | POA: Diagnosis not present

## 2015-04-27 NOTE — Telephone Encounter (Signed)
Patient double ear infection and was told that she was going to get a referral appointment to Dr. Tami Ribas at [facility]. Patient is just checking status

## 2015-04-28 ENCOUNTER — Ambulatory Visit
Admission: RE | Admit: 2015-04-28 | Discharge: 2015-04-28 | Disposition: A | Discharge status: Home / Self Care | Payer: Medicaid Other | Source: Ambulatory Visit | Attending: Radiation Oncology | Admitting: Radiation Oncology

## 2015-04-28 DIAGNOSIS — Z510 Encounter for antineoplastic radiation therapy: Secondary | ICD-10-CM | POA: Diagnosis not present

## 2015-04-28 NOTE — Telephone Encounter (Signed)
Called pt, left message for her to call back about her appt. Will mail referral letter

## 2015-04-29 ENCOUNTER — Inpatient Hospital Stay: Payer: Medicaid Other | Attending: Radiation Oncology

## 2015-04-29 ENCOUNTER — Ambulatory Visit
Admission: RE | Admit: 2015-04-29 | Discharge: 2015-04-29 | Disposition: A | Discharge status: Home / Self Care | Payer: Medicaid Other | Source: Ambulatory Visit | Attending: Radiation Oncology | Admitting: Radiation Oncology

## 2015-04-29 ENCOUNTER — Encounter
Admission: RE | Admit: 2015-04-29 | Discharge: 2015-04-29 | Disposition: A | Discharge status: Home / Self Care | Payer: Medicaid Other | Source: Ambulatory Visit | Attending: Radiation Oncology | Admitting: Radiation Oncology

## 2015-04-29 DIAGNOSIS — Z510 Encounter for antineoplastic radiation therapy: Secondary | ICD-10-CM | POA: Diagnosis not present

## 2015-04-29 DIAGNOSIS — D0512 Intraductal carcinoma in situ of left breast: Secondary | ICD-10-CM | POA: Diagnosis not present

## 2015-04-29 LAB — CBC
HCT: 37.4 % (ref 35.0–47.0)
Hemoglobin: 12.1 g/dL (ref 12.0–16.0)
MCH: 26.5 pg (ref 26.0–34.0)
MCHC: 32.3 g/dL (ref 32.0–36.0)
MCV: 82 fL (ref 80.0–100.0)
PLATELETS: 212 10*3/uL (ref 150–440)
RBC: 4.57 MIL/uL (ref 3.80–5.20)
RDW: 15.6 % — ABNORMAL HIGH (ref 11.5–14.5)
WBC: 7.9 10*3/uL (ref 3.6–11.0)

## 2015-04-30 ENCOUNTER — Ambulatory Visit
Admission: RE | Admit: 2015-04-30 | Discharge: 2015-04-30 | Disposition: A | Discharge status: Home / Self Care | Payer: Medicaid Other | Source: Ambulatory Visit | Attending: Radiation Oncology | Admitting: Radiation Oncology

## 2015-04-30 DIAGNOSIS — Z510 Encounter for antineoplastic radiation therapy: Secondary | ICD-10-CM | POA: Diagnosis not present

## 2015-05-03 ENCOUNTER — Ambulatory Visit
Admission: RE | Admit: 2015-05-03 | Discharge: 2015-05-03 | Disposition: A | Discharge status: Home / Self Care | Payer: Medicaid Other | Source: Ambulatory Visit | Attending: Radiation Oncology | Admitting: Radiation Oncology

## 2015-05-03 DIAGNOSIS — Z510 Encounter for antineoplastic radiation therapy: Secondary | ICD-10-CM | POA: Diagnosis not present

## 2015-05-04 ENCOUNTER — Ambulatory Visit
Admission: RE | Admit: 2015-05-04 | Discharge: 2015-05-04 | Disposition: A | Discharge status: Home / Self Care | Payer: Medicaid Other | Source: Ambulatory Visit | Attending: Radiation Oncology | Admitting: Radiation Oncology

## 2015-05-04 DIAGNOSIS — Z510 Encounter for antineoplastic radiation therapy: Secondary | ICD-10-CM | POA: Diagnosis not present

## 2015-05-05 ENCOUNTER — Ambulatory Visit
Admission: RE | Admit: 2015-05-05 | Discharge: 2015-05-05 | Disposition: A | Discharge status: Home / Self Care | Payer: Medicaid Other | Source: Ambulatory Visit | Attending: Radiation Oncology | Admitting: Radiation Oncology

## 2015-05-05 DIAGNOSIS — Z510 Encounter for antineoplastic radiation therapy: Secondary | ICD-10-CM | POA: Diagnosis not present

## 2015-05-06 ENCOUNTER — Ambulatory Visit
Admission: RE | Admit: 2015-05-06 | Discharge: 2015-05-06 | Disposition: A | Discharge status: Home / Self Care | Payer: Medicaid Other | Source: Ambulatory Visit | Attending: Radiation Oncology | Admitting: Radiation Oncology

## 2015-05-06 DIAGNOSIS — Z510 Encounter for antineoplastic radiation therapy: Secondary | ICD-10-CM | POA: Diagnosis not present

## 2015-05-07 ENCOUNTER — Ambulatory Visit
Admission: RE | Admit: 2015-05-07 | Discharge: 2015-05-07 | Disposition: A | Discharge status: Home / Self Care | Payer: Medicaid Other | Source: Ambulatory Visit | Attending: Radiation Oncology | Admitting: Radiation Oncology

## 2015-05-07 DIAGNOSIS — Z510 Encounter for antineoplastic radiation therapy: Secondary | ICD-10-CM | POA: Diagnosis not present

## 2015-05-11 ENCOUNTER — Inpatient Hospital Stay: Payer: Medicaid Other

## 2015-05-11 ENCOUNTER — Ambulatory Visit
Admission: RE | Admit: 2015-05-11 | Discharge: 2015-05-11 | Disposition: A | Discharge status: Home / Self Care | Payer: Medicaid Other | Source: Ambulatory Visit | Attending: Radiation Oncology | Admitting: Radiation Oncology

## 2015-05-11 DIAGNOSIS — Z510 Encounter for antineoplastic radiation therapy: Secondary | ICD-10-CM | POA: Diagnosis not present

## 2015-05-12 ENCOUNTER — Ambulatory Visit
Admission: RE | Admit: 2015-05-12 | Discharge: 2015-05-12 | Disposition: A | Discharge status: Home / Self Care | Payer: Medicaid Other | Source: Ambulatory Visit | Attending: Radiation Oncology | Admitting: Radiation Oncology

## 2015-05-12 DIAGNOSIS — Z510 Encounter for antineoplastic radiation therapy: Secondary | ICD-10-CM | POA: Diagnosis not present

## 2015-05-13 ENCOUNTER — Ambulatory Visit
Admission: RE | Admit: 2015-05-13 | Discharge: 2015-05-13 | Disposition: A | Discharge status: Home / Self Care | Payer: Medicaid Other | Source: Ambulatory Visit | Attending: Radiation Oncology | Admitting: Radiation Oncology

## 2015-05-13 DIAGNOSIS — Z510 Encounter for antineoplastic radiation therapy: Secondary | ICD-10-CM | POA: Diagnosis not present

## 2015-05-14 ENCOUNTER — Ambulatory Visit
Admission: RE | Admit: 2015-05-14 | Discharge: 2015-05-14 | Disposition: A | Discharge status: Home / Self Care | Payer: Medicaid Other | Source: Ambulatory Visit | Attending: Radiation Oncology | Admitting: Radiation Oncology

## 2015-05-14 DIAGNOSIS — Z510 Encounter for antineoplastic radiation therapy: Secondary | ICD-10-CM | POA: Diagnosis not present

## 2015-05-17 ENCOUNTER — Other Ambulatory Visit: Payer: Self-pay | Admitting: Family Medicine

## 2015-05-18 ENCOUNTER — Ambulatory Visit: Payer: Medicaid Other

## 2015-05-18 ENCOUNTER — Ambulatory Visit (HOSPITAL_BASED_OUTPATIENT_CLINIC_OR_DEPARTMENT_OTHER): Payer: Medicaid Other | Admitting: Oncology

## 2015-05-18 ENCOUNTER — Ambulatory Visit
Admission: RE | Admit: 2015-05-18 | Discharge: 2015-05-18 | Disposition: A | Discharge status: Home / Self Care | Payer: Medicaid Other | Source: Ambulatory Visit | Attending: Radiation Oncology | Admitting: Radiation Oncology

## 2015-05-18 VITALS — BP 128/58 | HR 81 | Temp 97.9°F | Resp 20 | Wt 358.8 lb

## 2015-05-18 DIAGNOSIS — Z170 Estrogen receptor positive status [ER+]: Secondary | ICD-10-CM

## 2015-05-18 DIAGNOSIS — C50412 Malignant neoplasm of upper-outer quadrant of left female breast: Secondary | ICD-10-CM

## 2015-05-18 DIAGNOSIS — D0512 Intraductal carcinoma in situ of left breast: Secondary | ICD-10-CM | POA: Diagnosis not present

## 2015-05-18 DIAGNOSIS — Z510 Encounter for antineoplastic radiation therapy: Secondary | ICD-10-CM | POA: Diagnosis not present

## 2015-05-18 NOTE — Progress Notes (Signed)
[facility]  [phone]) [phone] Fax:(336) [phone]     ID: Elizabeth Hoffman DOB: 30-Nov-1970  MR#: [id number]  TMH#:[id number]  Patient Care Team: Bobetta Lime, MD as PCP - General (Family Medicine) Erroll Luna, MD as Consulting Physician (General Surgery) Chauncey Cruel, MD as Consulting Physician (Oncology) Gery Pray, MD as Consulting Physician (Radiation Oncology) Rockwell Germany, RN as Registered Nurse Mauro Kaufmann, RN as Registered Nurse Will Bonnet, MD as Attending Physician (Obstetrics and Gynecology) Sylvan Cheese, NP as Nurse Practitioner (Hematology and Oncology) PCP: Bobetta Lime, MD OTHER MD: Berton Mount M.D.  CHIEF COMPLAINT: Ductal carcinoma in situ, estrogen receptor positive  CURRENT TREATMENT: tamoxifen   BREAST CANCER HISTORY: From the original intake note:  Shaconda was having mild bilateral mastalgia and discuss this with her primary care physician, but her screening bilateral mammography at [facility] 02/09/2015 was routinely scheduled. New calcifications in the left breast were noted and evaluated further 02/15/2015 with a left diagnostic mammogram. This showed a linear group of calcifications in the upper outer quadrant of the left breast extending over an area of approximately 3.1 cm.. There was no discrete mass. The breast density was category B.  Biopsy of the suspicious area in question 02/18/2015 showed ([id number]) ductal carcinoma in situ, intermediate grade, estrogen receptor 90% positive, progesterone receptor 40% positive.  Her subsequent history is as detailed below  INTERVAL HISTORY: [place] today for follow-up of her estrogen receptor positive breast cancer. Since her last visit here she underwent left lumpectomy, on 03/18/2015. The final pathology ([id number]) showed ductal carcinoma in situ, intermediate grade, measuring approximately 3 cm. The posterior margin was close, but  additional tissue obtained during the same procedure was clear. The patient then proceeded to radiation treatments under Berton Mount, which are still ongoing. She is here today to discuss antiestrogen therapy  REVIEW OF SYSTEMS: [name] is tolerating the radiation generally well. She has just started to develop some desquamation. She complains of mild fatigue. She also did well with her lumpectomy, with no unusual pain, bleeding, or fever complications. Other issues include chronic heartburn, and some anxiety, but no depression. A detailed review of systems today was otherwise stable  PAST MEDICAL HISTORY: Past Medical History  Diagnosis Date  . Arthritis   . Depression   . Hypertension   . Anxiety   . Hot flashes   . Breast cancer ([place]) 2016    PAST SURGICAL HISTORY: Past Surgical History  Procedure Laterality Date  . Tonsillectomy and adenoidectomy    . Uvulectomy    . Cholecystectomy    . Hernia repair    . Cervical biopsy  w/ loop electrode excision    . Nasal septum surgery    . Mastectomy, partial Left 02/15/2015  . Breast lumpectomy with radioactive seed localization Left 03/18/2015    Procedure: BREAST LUMPECTOMY WITH RADIOACTIVE SEED LOCALIZATION;  Surgeon: Erroll Luna, MD;  Location: [facility];  Service: General;  Laterality: Left;    FAMILY HISTORY Family History  Problem Relation Age of Onset  . Adopted: Yes  . Hyperlipidemia Mother   . Hypertension Mother   . Hyperlipidemia Father   . Hypertension Father    the patient is adopted and has no information on her biological family  GYNECOLOGIC HISTORY:  No LMP recorded. Patient is not currently having periods (Reason: Irregular Periods). Menarche age 45, first live birth age 45. She is GX P3. She still having regular periods. She has  a Mirena IUD in place. She never used oral contraceptives.  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 Her husband is Alvester Chou "[employer]. At home it's the 2 of  them plus Marlene Bast and Durene Romans, age 44 and 19 respectively, and Wardell Heath, age 30. The patient attends a Information systems manager or [employer] [place].    ADVANCED DIRECTIVES: Not in place   HEALTH MAINTENANCE: Social History  Substance Use Topics  . Smoking status: Never Smoker   . Smokeless tobacco: Not on file  . Alcohol Use: No     Colonoscopy:  PAP: September 2016  Bone density:  Lipid panel:  No Known Allergies  Current Outpatient Prescriptions  Medication Sig Dispense Refill  . amLODipine (NORVASC) 5 MG tablet take 1 (ONE) TABLET, ORAL, DAILY 90 tablet 1  . azithromycin (ZITHROMAX) 500 MG tablet Take 1 tablet (500 mg total) by mouth daily. 10 tablet 0  . hydrochlorothiazide (HYDRODIURIL) 25 MG tablet Take 25 mg by mouth daily.    Marland Kitchen oxyCODONE-acetaminophen (ROXICET) 5-325 MG tablet Take 1-2 tablets by mouth every 4 (four) hours as needed. (Patient not taking: Reported on 03/25/2015) 30 tablet 0  . pantoprazole (PROTONIX) 40 MG tablet 1 (ONE) TABLET DR, ORAL, DAILY 30 tablet 4  . sertraline (ZOLOFT) 100 MG tablet TAKE 1 (ONE) TABLET, ORAL, DAILY 30 tablet 4   No current facility-administered medications for this visit.    OBJECTIVE: Middle-aged white woman in no acute distress Filed Vitals:   05/18/15 1437  BP: 128/58  Pulse: 81  Temp: 97.9 F (36.6 C)  Resp: 20     Body mass index is 52.96 kg/(m^2).    ECOG FS:1 - Symptomatic but completely ambulatory  Sclerae unicteric, pupils round and equal Oropharynx clear and moist-- no thrush or other lesions No cervical or supraclavicular adenopathy Lungs no rales or rhonchi Heart regular rate and rhythm Abd soft, nontender, positive bowel sounds MSK no focal spinal tenderness, no upper extremity lymphedema Neuro: nonfocal, well oriented, appropriate affect Breasts: the right breast is unremarkable. The left breast is status post lumpectomy and is currently undergoing radiation. There is minimal  erythema. There is some dry desquamation in the inframammary fold. The overall cosmetic result is good. The left axilla is benign.     LAB RESULTS:  CMP     Component Value Date/Time   NA 141 03/15/2015 1453   NA 141 02/24/2015 1229   NA 141 02/04/2015 0905   NA 139 09/23/2013 1231   K 3.9 03/15/2015 1453   K 3.7 02/24/2015 1229   K 3.8 09/23/2013 1231   CL 104 03/15/2015 1453   CL 107 09/23/2013 1231   CO2 27 03/15/2015 1453   CO2 25 02/24/2015 1229   CO2 27 09/23/2013 1231   GLUCOSE 125* 03/15/2015 1453   GLUCOSE 114 02/24/2015 1229   GLUCOSE 97 02/04/2015 0905   GLUCOSE 102* 09/23/2013 1231   BUN 10 03/15/2015 1453   BUN 10.9 02/24/2015 1229   BUN 13 02/04/2015 0905   BUN 14 09/23/2013 1231   CREATININE 1.03* 03/15/2015 1453   CREATININE 0.9 02/24/2015 1229   CREATININE 1.16 09/23/2013 1231   CALCIUM 10.0 03/15/2015 1453   CALCIUM 9.5 02/24/2015 1229   CALCIUM 9.2 09/23/2013 1231   PROT 7.1 03/15/2015 1453   PROT 7.5 02/24/2015 1229   PROT 7.5 02/04/2015 0905   ALBUMIN 3.6 03/15/2015 1453   ALBUMIN 3.7 02/24/2015 1229   ALBUMIN 4.3 02/04/2015 0905   AST 25  03/15/2015 1453   AST 29 02/24/2015 1229   ALT 26 03/15/2015 1453   ALT 32 02/24/2015 1229   ALKPHOS 72 03/15/2015 1453   ALKPHOS 77 02/24/2015 1229   BILITOT 0.4 03/15/2015 1453   BILITOT 0.30 02/24/2015 1229   BILITOT 0.3 02/04/2015 0905   GFRNONAA >60 03/15/2015 1453   GFRNONAA 58* 09/23/2013 1231   GFRAA >60 03/15/2015 1453   GFRAA >60 09/23/2013 1231    INo results found for: SPEP, UPEP  Lab Results  Component Value Date   WBC 7.9 04/29/2015   NEUTROABS 6.4 03/15/2015   HGB 12.1 04/29/2015   HCT 37.4 04/29/2015   MCV 82.0 04/29/2015   PLT 212 04/29/2015      Chemistry      Component Value Date/Time   NA 141 03/15/2015 1453   NA 141 02/24/2015 1229   NA 141 02/04/2015 0905   NA 139 09/23/2013 1231   K 3.9 03/15/2015 1453   K 3.7 02/24/2015 1229   K 3.8 09/23/2013 1231   CL 104  03/15/2015 1453   CL 107 09/23/2013 1231   CO2 27 03/15/2015 1453   CO2 25 02/24/2015 1229   CO2 27 09/23/2013 1231   BUN 10 03/15/2015 1453   BUN 10.9 02/24/2015 1229   BUN 13 02/04/2015 0905   BUN 14 09/23/2013 1231   CREATININE 1.03* 03/15/2015 1453   CREATININE 0.9 02/24/2015 1229   CREATININE 1.16 09/23/2013 1231      Component Value Date/Time   CALCIUM 10.0 03/15/2015 1453   CALCIUM 9.5 02/24/2015 1229   CALCIUM 9.2 09/23/2013 1231   ALKPHOS 72 03/15/2015 1453   ALKPHOS 77 02/24/2015 1229   AST 25 03/15/2015 1453   AST 29 02/24/2015 1229   ALT 26 03/15/2015 1453   ALT 32 02/24/2015 1229   BILITOT 0.4 03/15/2015 1453   BILITOT 0.30 02/24/2015 1229   BILITOT 0.3 02/04/2015 0905       No results found for: LABCA2  No components found for: ZOXWR604  No results for input(s): INR in the last 168 hours.  Urinalysis No results found for: COLORURINE, APPEARANCEUR, LABSPEC, PHURINE, GLUCOSEU, HGBUR, BILIRUBINUR, KETONESUR, PROTEINUR, UROBILINOGEN, NITRITE, LEUKOCYTESUR  STUDIES: No results found.  ASSESSMENT: 45 y.o. BRCA negative [employer], [place] woman s/p left breast upper outer quadrant biopsy 02/18/2015 for a ductal carcinoma in situ, grade 2 or 3, estrogen and progesterone receptor positive  (1) status post left lumpectomy 03/18/2015 for ductal carcinoma in situ, grade 2, final margins negative  (2) adjuvant radiation to be completed late January 2017  (3) tamoxifen to start 06/29/2015  (a) the patient is considering a hysterectomy with bilateral salpingo-oophorectomy  (4) genetics testing 03/05/2015 through the Breast/Ovarian cancer gene panel found no deleterious mutations in: ATM, BARD1, BRCA1, BRCA2, BRIP1, CDH1, CHEK2, EPCAM, FANCC, MLH1, MSH2, MSH6, NBN, PALB2, PMS2, PTEN, RAD51C, RAD51D, TP53, and XRCC2.   PLAN: Asaiah we'll complete her local treatment for her noninvasive cancer later this month. Given the size of the tumor and the concern regarding  margins, although the closest margin was cleared, I would quote her a 12% or so risk of local recurrence after optimal local treatment as she has received.  If she took tamoxifen for 5 years that risk would be cut in half.  Perhaps more importantly, she has an approximately 1% per year chance of developing another breast cancer in either breast. Approximately half of those would be invasive. Tamoxifen would also cut the risk of those potential breast cancers  in half.to me this preventative use of tamoxifen is a stronger argument in its favor then did treatment since her risk of local recurrence is already so low  We then discussed the possible toxicities, side effects and complications of tamoxifen. The fact that she is in a Mirena IUD is not contraindicated. On the contrary, this has been shown to decrease endometrial thickening in patients taking tamoxifen. We have good data that it does not increase the risk of breast cancer in this setting.  Accordingly she will start tamoxifen on Valentine's Day, by which time she should have recovered sufficiently from the radiation treatments. She we will then see me again in May. If she tolerates the tamoxifen well the plan will be to continue that for 5 years and we would do yearly visits, with no lab work.  Cambreigh has a good understanding of this plan. She agrees with it. She knows the goal of treatment in her case is both cure and prevention. She will call with any problems that may develop before her next visit here.    Chauncey Cruel, MD   05/18/2015 2:57 PM Medical Oncology and Hematology [facility] [address] [place], [place] [address] Tel. [phone]    Fax. [phone]

## 2015-05-19 ENCOUNTER — Telehealth: Payer: Self-pay | Admitting: Oncology

## 2015-05-19 ENCOUNTER — Ambulatory Visit
Admission: RE | Admit: 2015-05-19 | Discharge: 2015-05-19 | Disposition: A | Discharge status: Home / Self Care | Payer: Medicaid Other | Source: Ambulatory Visit | Attending: Radiation Oncology | Admitting: Radiation Oncology

## 2015-05-19 DIAGNOSIS — Z510 Encounter for antineoplastic radiation therapy: Secondary | ICD-10-CM | POA: Diagnosis not present

## 2015-05-19 NOTE — Telephone Encounter (Signed)
Left message for patient re 5/11 f/u and mailed schedule.

## 2015-05-20 ENCOUNTER — Ambulatory Visit
Admission: RE | Admit: 2015-05-20 | Discharge: 2015-05-20 | Disposition: A | Discharge status: Home / Self Care | Payer: Medicaid Other | Source: Ambulatory Visit | Attending: Radiation Oncology | Admitting: Radiation Oncology

## 2015-05-20 DIAGNOSIS — Z510 Encounter for antineoplastic radiation therapy: Secondary | ICD-10-CM | POA: Diagnosis not present

## 2015-05-21 ENCOUNTER — Ambulatory Visit
Admission: RE | Admit: 2015-05-21 | Discharge: 2015-05-21 | Disposition: A | Discharge status: Home / Self Care | Payer: Medicaid Other | Source: Ambulatory Visit | Attending: Radiation Oncology | Admitting: Radiation Oncology

## 2015-05-21 DIAGNOSIS — Z510 Encounter for antineoplastic radiation therapy: Secondary | ICD-10-CM | POA: Diagnosis not present

## 2015-05-24 ENCOUNTER — Ambulatory Visit
Admission: RE | Admit: 2015-05-24 | Discharge: 2015-05-24 | Disposition: A | Discharge status: Home / Self Care | Payer: Medicaid Other | Source: Ambulatory Visit | Attending: Radiation Oncology | Admitting: Radiation Oncology

## 2015-05-24 ENCOUNTER — Ambulatory Visit: Payer: Medicaid Other

## 2015-05-25 ENCOUNTER — Ambulatory Visit
Admission: RE | Admit: 2015-05-25 | Discharge: 2015-05-25 | Disposition: A | Discharge status: Home / Self Care | Payer: Medicaid Other | Source: Ambulatory Visit | Attending: Radiation Oncology | Admitting: Radiation Oncology

## 2015-05-25 ENCOUNTER — Other Ambulatory Visit: Payer: Self-pay | Admitting: *Deleted

## 2015-05-25 ENCOUNTER — Inpatient Hospital Stay: Payer: Medicaid Other | Attending: Radiation Oncology

## 2015-05-25 DIAGNOSIS — Z510 Encounter for antineoplastic radiation therapy: Secondary | ICD-10-CM | POA: Diagnosis not present

## 2015-05-25 MED ORDER — SILVER SULFADIAZINE 1 % EX CREA: 1.0000 "application " | TOPICAL_CREAM | Freq: Every day | CUTANEOUS | Status: DC

## 2015-05-26 ENCOUNTER — Other Ambulatory Visit: Payer: Self-pay | Admitting: *Deleted

## 2015-05-26 ENCOUNTER — Ambulatory Visit
Admission: RE | Admit: 2015-05-26 | Discharge: 2015-05-26 | Disposition: A | Discharge status: Home / Self Care | Payer: Medicaid Other | Source: Ambulatory Visit | Attending: Radiation Oncology | Admitting: Radiation Oncology

## 2015-05-26 DIAGNOSIS — Z510 Encounter for antineoplastic radiation therapy: Secondary | ICD-10-CM | POA: Diagnosis not present

## 2015-05-27 ENCOUNTER — Ambulatory Visit: Payer: Medicaid Other

## 2015-05-27 DIAGNOSIS — Z510 Encounter for antineoplastic radiation therapy: Secondary | ICD-10-CM | POA: Diagnosis not present

## 2015-05-28 ENCOUNTER — Ambulatory Visit
Admission: RE | Admit: 2015-05-28 | Discharge: 2015-05-28 | Disposition: A | Discharge status: Home / Self Care | Payer: Medicaid Other | Source: Ambulatory Visit | Attending: Radiation Oncology | Admitting: Radiation Oncology

## 2015-05-28 DIAGNOSIS — Z510 Encounter for antineoplastic radiation therapy: Secondary | ICD-10-CM | POA: Diagnosis not present

## 2015-05-30 ENCOUNTER — Telehealth: Payer: Self-pay | Admitting: Oncology

## 2015-05-30 NOTE — Telephone Encounter (Signed)
Number has been changed or d/c,placed a note on rad onc appt at [facility] to give her dr Jana Hakim appointment

## 2015-05-31 ENCOUNTER — Ambulatory Visit
Admission: RE | Admit: 2015-05-31 | Discharge: 2015-05-31 | Disposition: A | Discharge status: Home / Self Care | Payer: Medicaid Other | Source: Ambulatory Visit | Attending: Radiation Oncology | Admitting: Radiation Oncology

## 2015-05-31 DIAGNOSIS — Z510 Encounter for antineoplastic radiation therapy: Secondary | ICD-10-CM | POA: Diagnosis not present

## 2015-06-01 ENCOUNTER — Ambulatory Visit
Admission: RE | Admit: 2015-06-01 | Discharge: 2015-06-01 | Disposition: A | Discharge status: Home / Self Care | Payer: Medicaid Other | Source: Ambulatory Visit | Attending: Radiation Oncology | Admitting: Radiation Oncology

## 2015-06-01 DIAGNOSIS — Z510 Encounter for antineoplastic radiation therapy: Secondary | ICD-10-CM | POA: Diagnosis not present

## 2015-06-02 ENCOUNTER — Ambulatory Visit
Admission: RE | Admit: 2015-06-02 | Discharge: 2015-06-02 | Disposition: A | Discharge status: Home / Self Care | Payer: Medicaid Other | Source: Ambulatory Visit | Attending: Radiation Oncology | Admitting: Radiation Oncology

## 2015-06-02 DIAGNOSIS — Z510 Encounter for antineoplastic radiation therapy: Secondary | ICD-10-CM | POA: Diagnosis not present

## 2015-06-03 ENCOUNTER — Ambulatory Visit
Admission: RE | Admit: 2015-06-03 | Discharge: 2015-06-03 | Disposition: A | Discharge status: Home / Self Care | Payer: Medicaid Other | Source: Ambulatory Visit | Attending: Radiation Oncology | Admitting: Radiation Oncology

## 2015-06-03 DIAGNOSIS — Z510 Encounter for antineoplastic radiation therapy: Secondary | ICD-10-CM | POA: Diagnosis not present

## 2015-06-04 ENCOUNTER — Ambulatory Visit: Admission: RE | Admit: 2015-06-04 | Payer: Medicaid Other | Source: Ambulatory Visit

## 2015-06-07 ENCOUNTER — Ambulatory Visit
Admission: RE | Admit: 2015-06-07 | Discharge: 2015-06-07 | Disposition: A | Discharge status: Home / Self Care | Payer: Medicaid Other | Source: Ambulatory Visit | Attending: Radiation Oncology | Admitting: Radiation Oncology

## 2015-06-07 ENCOUNTER — Ambulatory Visit: Payer: Medicaid Other

## 2015-06-07 DIAGNOSIS — Z510 Encounter for antineoplastic radiation therapy: Secondary | ICD-10-CM | POA: Diagnosis not present

## 2015-06-08 ENCOUNTER — Ambulatory Visit
Admission: RE | Admit: 2015-06-08 | Discharge: 2015-06-08 | Disposition: A | Discharge status: Home / Self Care | Payer: Medicaid Other | Source: Ambulatory Visit | Attending: Radiation Oncology | Admitting: Radiation Oncology

## 2015-06-08 DIAGNOSIS — Z510 Encounter for antineoplastic radiation therapy: Secondary | ICD-10-CM | POA: Diagnosis not present

## 2015-06-09 ENCOUNTER — Telehealth: Payer: Self-pay | Admitting: *Deleted

## 2015-06-09 ENCOUNTER — Encounter: Payer: Self-pay | Admitting: *Deleted

## 2015-06-09 ENCOUNTER — Ambulatory Visit
Admission: RE | Admit: 2015-06-09 | Discharge: 2015-06-09 | Disposition: A | Discharge status: Home / Self Care | Payer: Medicaid Other | Source: Ambulatory Visit | Attending: Radiation Oncology | Admitting: Radiation Oncology

## 2015-06-09 DIAGNOSIS — Z510 Encounter for antineoplastic radiation therapy: Secondary | ICD-10-CM | POA: Diagnosis not present

## 2015-06-09 NOTE — Telephone Encounter (Signed)
Left message to follow up after radiation completion.

## 2015-06-15 ENCOUNTER — Other Ambulatory Visit: Payer: Self-pay | Admitting: *Deleted

## 2015-06-15 ENCOUNTER — Telehealth: Payer: Self-pay

## 2015-06-15 MED ORDER — TAMOXIFEN CITRATE 20 MG PO TABS: 20.0000 mg | ORAL_TABLET | Freq: Every day | ORAL | Status: DC

## 2015-06-15 NOTE — Telephone Encounter (Signed)
Obtained order and escribed to pt's pharmacy.

## 2015-06-15 NOTE — Telephone Encounter (Signed)
Called pt tamoxifen rx sent.

## 2015-06-15 NOTE — Telephone Encounter (Signed)
Pt called requesting prescription for tamoxifen be called to [facility] in [place]. No tamoxifen seen of med list.

## 2015-07-05 ENCOUNTER — Ambulatory Visit (HOSPITAL_BASED_OUTPATIENT_CLINIC_OR_DEPARTMENT_OTHER): Payer: Medicaid Other | Admitting: Oncology

## 2015-07-05 ENCOUNTER — Other Ambulatory Visit: Payer: Self-pay | Admitting: *Deleted

## 2015-07-05 VITALS — BP 151/69 | HR 73 | Temp 97.7°F | Resp 20 | Ht 69.0 in | Wt 360.1 lb

## 2015-07-05 DIAGNOSIS — C50412 Malignant neoplasm of upper-outer quadrant of left female breast: Secondary | ICD-10-CM

## 2015-07-05 DIAGNOSIS — D0512 Intraductal carcinoma in situ of left breast: Secondary | ICD-10-CM | POA: Diagnosis not present

## 2015-07-05 NOTE — Progress Notes (Signed)
[place]  [phone] [phone]     ID: Elizabeth Hoffman DOB: [birth year]  MR#: [id number]  QAS#:[id number]  Patient Care Team: Bobetta Lime, MD as PCP - General (Family Medicine) Erroll Luna, MD as Consulting Physician (General Surgery) Chauncey Cruel, MD as Consulting Physician (Oncology) Gery Pray, MD as Consulting Physician (Radiation Oncology) Rockwell Germany, RN as Registered Nurse Mauro Kaufmann, RN as Registered Nurse Will Bonnet, MD as Attending Physician (Obstetrics and Gynecology) Sylvan Cheese, NP as Nurse Practitioner (Hematology and Oncology) PCP: Bobetta Lime, MD OTHER MD: Berton Mount M.D.  CHIEF COMPLAINT: Ductal carcinoma in situ, estrogen receptor positive  CURRENT TREATMENT: tamoxifen   BREAST CANCER HISTORY: From the original intake note:  Elizabeth Hoffman was having mild bilateral mastalgia and discuss this with her primary care physician, but her screening bilateral mammography at [facility] 02/09/2015 was routinely scheduled. New calcifications in the left breast were noted and evaluated further 02/15/2015 with a left diagnostic mammogram. This showed a linear group of calcifications in the upper outer quadrant of the left breast extending over an area of approximately 3.1 cm.. There was no discrete mass. The breast density was category B.  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  Her subsequent history is as detailed below  INTERVAL HISTORY: Elizabeth Hoffman returns today for follow-up of her noninvasive breast cancer. Since her last visit here she completed her radiation treatments. She then started tamoxifen in 06/29/2015. So far she is having a filling no side effects from this. She obtains it at a good price. She has a Mirena IUD in place and is not  having any periods.   REVIEW OF SYSTEMS: Elizabeth Hoffman did well with the radiation, with some desquamation and fatigue, but this skin has healed over and she is back to her usual activity level. She is not exercising regularly but she is active with her children, doing housework, shopping and cooking. She has discomfort in the surgical breast at times, but this is not persistent or intense. A detailed review of systems today was otherwise negative  PAST MEDICAL HISTORY: Past Medical History  Diagnosis Date  . Arthritis   . Depression   . Hypertension   . Anxiety   . Hot flashes   . Breast cancer ([place]) 2016    PAST SURGICAL HISTORY: Past Surgical History  Procedure Laterality Date  . Tonsillectomy and adenoidectomy    . Uvulectomy    . Cholecystectomy    . Hernia repair    . Cervical biopsy  w/ loop electrode excision    . Nasal septum surgery    . Mastectomy, partial Left 02/15/2015  . Breast lumpectomy with radioactive seed localization Left 03/18/2015    Procedure: BREAST LUMPECTOMY WITH RADIOACTIVE SEED LOCALIZATION;  Surgeon: Erroll Luna, MD;  Location: [facility];  Service: General;  Laterality: Left;    FAMILY HISTORY Family History  Problem Relation Age of Onset  . Adopted: Yes  . Hyperlipidemia Mother   . Hypertension Mother   . Hyperlipidemia Father   . Hypertension Father    the patient is adopted and has no information on her biological family  GYNECOLOGIC HISTORY:  No LMP recorded. Patient is not currently having periods (Reason: Irregular Periods). Menarche age 33, first live birth age 47. She is GX P3. She still having regular periods. She has a Mirena IUD in  place. She never used oral contraceptives.  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 Her husband is Elizabeth Hoffman. At home it's the 2 of them plus Elizabeth Hoffman and Elizabeth Hoffman, age 19 and 93 respectively, and Elizabeth Hoffman, age 53. 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    ADVANCED DIRECTIVES: Not in place   HEALTH MAINTENANCE: Social History  Substance Use Topics  . Smoking status: Never Smoker   . Smokeless tobacco: Not on file  . Alcohol Use: No     Colonoscopy:  PAP: September 2016  Bone density:  Lipid panel:  No Known Allergies  Current Outpatient Prescriptions  Medication Sig Dispense Refill  . amLODipine (NORVASC) 5 MG tablet take 1 (ONE) TABLET, ORAL, DAILY 90 tablet 1  . azithromycin (ZITHROMAX) 500 MG tablet Take 1 tablet (500 mg total) by mouth daily. 10 tablet 0  . hydrochlorothiazide (HYDRODIURIL) 25 MG tablet Take 25 mg by mouth daily.    Marland Kitchen oxyCODONE-acetaminophen (ROXICET) 5-325 MG tablet Take 1-2 tablets by mouth every 4 (four) hours as needed. (Patient not taking: Reported on 03/25/2015) 30 tablet 0  . pantoprazole (PROTONIX) 40 MG tablet 1 (ONE) TABLET DR, ORAL, DAILY 30 tablet 4  . sertraline (ZOLOFT) 100 MG tablet TAKE 1 (ONE) TABLET, ORAL, DAILY 30 tablet 4  . silver sulfADIAZINE (SILVADENE) 1 % cream Apply 1 application topically daily. 50 g 2  . tamoxifen (NOLVADEX) 20 MG tablet Take 1 tablet (20 mg total) by mouth daily. 30 tablet 3   No current facility-administered medications for this visit.    OBJECTIVE: Middle-aged white woman who appears well Filed Vitals:   07/05/15 1114  BP: 151/69  Pulse: 73  Temp: 97.7 F (36.5 C)  Resp: 20     Body mass index is 53.15 kg/(m^2).    ECOG FS:0 - Asymptomatic  Sclerae unicteric, EOMs intact Oropharynx clear, dentition in good repair No cervical or supraclavicular adenopathy Lungs no rales or rhonchi Heart regular rate and rhythm Abd soft, nontender, positive bowel sounds MSK no focal spinal tenderness, no upper extremity lymphedema Neuro: nonfocal, well oriented, appropriate affect Breasts: There are no skin or nipple changes of concern in the right breast. The left breast is status post lumpectomy and radiation. There is no evidence of local  recurrence or skin changes of concern. The left axilla is benign.   LAB RESULTS:  CMP     Component Value Date/Time   NA 141 03/15/2015 1453   NA 141 02/24/2015 1229   NA 141 02/04/2015 0905   NA 139 09/23/2013 1231   K 3.9 03/15/2015 1453   K 3.7 02/24/2015 1229   K 3.8 09/23/2013 1231   CL 104 03/15/2015 1453   CL 107 09/23/2013 1231   CO2 27 03/15/2015 1453   CO2 25 02/24/2015 1229   CO2 27 09/23/2013 1231   GLUCOSE 125* 03/15/2015 1453   GLUCOSE 114 02/24/2015 1229   GLUCOSE 97 02/04/2015 0905   GLUCOSE 102* 09/23/2013 1231   BUN 10 03/15/2015 1453   BUN 10.9 02/24/2015 1229   BUN 13 02/04/2015 0905   BUN 14 09/23/2013 1231   CREATININE 1.03* 03/15/2015 1453   CREATININE 0.9 02/24/2015 1229   CREATININE 1.16 09/23/2013 1231   CALCIUM 10.0 03/15/2015 1453   CALCIUM 9.5 02/24/2015 1229   CALCIUM 9.2 09/23/2013 1231   PROT 7.1 03/15/2015 1453   PROT 7.5 02/24/2015 1229   PROT 7.5 02/04/2015 0905   ALBUMIN 3.6  03/15/2015 1453   ALBUMIN 3.7 02/24/2015 1229   ALBUMIN 4.3 02/04/2015 0905   AST 25 03/15/2015 1453   AST 29 02/24/2015 1229   ALT 26 03/15/2015 1453   ALT 32 02/24/2015 1229   ALKPHOS 72 03/15/2015 1453   ALKPHOS 77 02/24/2015 1229   BILITOT 0.4 03/15/2015 1453   BILITOT 0.30 02/24/2015 1229   BILITOT 0.3 02/04/2015 0905   GFRNONAA >60 03/15/2015 1453   GFRNONAA 58* 09/23/2013 1231   GFRAA >60 03/15/2015 1453   GFRAA >60 09/23/2013 1231    INo results found for: SPEP, UPEP  Lab Results  Component Value Date   WBC 7.9 04/29/2015   NEUTROABS 6.4 03/15/2015   HGB 12.1 04/29/2015   HCT 37.4 04/29/2015   MCV 82.0 04/29/2015   PLT 212 04/29/2015      Chemistry      Component Value Date/Time   NA 141 03/15/2015 1453   NA 141 02/24/2015 1229   NA 141 02/04/2015 0905   NA 139 09/23/2013 1231   K 3.9 03/15/2015 1453   K 3.7 02/24/2015 1229   K 3.8 09/23/2013 1231   CL 104 03/15/2015 1453   CL 107 09/23/2013 1231   CO2 27 03/15/2015 1453    CO2 25 02/24/2015 1229   CO2 27 09/23/2013 1231   BUN 10 03/15/2015 1453   BUN 10.9 02/24/2015 1229   BUN 13 02/04/2015 0905   BUN 14 09/23/2013 1231   CREATININE 1.03* 03/15/2015 1453   CREATININE 0.9 02/24/2015 1229   CREATININE 1.16 09/23/2013 1231      Component Value Date/Time   CALCIUM 10.0 03/15/2015 1453   CALCIUM 9.5 02/24/2015 1229   CALCIUM 9.2 09/23/2013 1231   ALKPHOS 72 03/15/2015 1453   ALKPHOS 77 02/24/2015 1229   AST 25 03/15/2015 1453   AST 29 02/24/2015 1229   ALT 26 03/15/2015 1453   ALT 32 02/24/2015 1229   BILITOT 0.4 03/15/2015 1453   BILITOT 0.30 02/24/2015 1229   BILITOT 0.3 02/04/2015 0905       No results found for: LABCA2  No components found for: LTJQZ009  No results for input(s): INR in the last 168 hours.  Urinalysis No results found for: COLORURINE, APPEARANCEUR, LABSPEC, PHURINE, GLUCOSEU, HGBUR, BILIRUBINUR, KETONESUR, PROTEINUR, UROBILINOGEN, NITRITE, LEUKOCYTESUR  STUDIES: No results found.  ASSESSMENT: 45 y.o. BRCA negative [place], [place] woman s/p left breast upper outer quadrant biopsy 02/18/2015 for a ductal carcinoma in situ, grade 2 or 3, estrogen and progesterone receptor positive  (1) status post left lumpectomy 03/18/2015 for ductal carcinoma in situ, grade 2, final margins negative  (2) adjuvant radiation to be completed late January 2017  (3) tamoxifen to start 06/29/2015  (a) the patient is considering a hysterectomy with bilateral salpingo-oophorectomy  (4) genetics testing 03/05/2015 through the Breast/Ovarian cancer gene panel found no deleterious mutations in: ATM, BARD1, BRCA1, BRCA2, BRIP1, CDH1, CHEK2, EPCAM, FANCC, MLH1, MSH2, MSH6, NBN, PALB2, PMS2, PTEN, RAD51C, RAD51D, TP53, and XRCC2.   PLAN: Iyanah has completed the local treatment for her breast cancer and is tolerating tamoxifen well. The plan will be to continue on this medication for 5 years.  She understands that tamoxifen is highly  protein-bound. He takes approximately 4 months for a steady blood level to be achieved. Similarly it takes about 4 months for the drug to "get out of 1 system" once she stops. Accordingly one would not expect significant changes in side effects from one day to another.  If however she  tells [place] in 2 or 3 months that she is having more hot flashes I would think that tamoxifen might indeed be the cause.  The Selinda Eon IUD works well with tamoxifen because it tends to prevent endometrial hyperplasia and polyps formation that can occur with this medication. We have good data that it is safe.  There is still controversy whether patients taking tamoxifen should be CERTAIN antidepressants which alter tamoxifen metabolism. There is no question that the tamoxifen metabolism is altered, but the clinical data we have remarkably enough is that this does not make a difference to its effectiveness in these patients. Accordingly I am not suggesting she make a change in her antidepressant.  She does have morbid obesity, and we talked about diet and exercise interventions regarding that. She might be a candidate for bariatric surgery at some point.  She will see me again in July, and if all is going well at that time we will probably start seeing her on a yearly basis thereafter.    Chauncey Cruel, MD   07/05/2015 11:38 AM Medical Oncology and Hematology [facility] [address] [place], [place] [address] Tel. [phone]    Fax. [phone]

## 2015-07-06 ENCOUNTER — Telehealth: Payer: Self-pay | Admitting: Oncology

## 2015-07-06 NOTE — Telephone Encounter (Signed)
Made appts per 2/20 pof. Left message for pt in regards to appts date/time

## 2015-07-12 ENCOUNTER — Other Ambulatory Visit: Payer: Self-pay | Admitting: Family Medicine

## 2015-07-13 ENCOUNTER — Encounter (HOSPITAL_COMMUNITY): Payer: Self-pay

## 2015-07-14 ENCOUNTER — Inpatient Hospital Stay
Admission: RE | Admit: 2015-07-14 | Discharge: 2015-07-14 | Disposition: A | Discharge status: Home / Self Care | Payer: Medicaid Other | Source: Ambulatory Visit | Attending: Radiation Oncology | Admitting: Radiation Oncology

## 2015-07-14 ENCOUNTER — Ambulatory Visit: Payer: Medicaid Other | Admitting: Radiation Oncology

## 2015-07-19 ENCOUNTER — Ambulatory Visit: Payer: Medicaid Other | Admitting: Radiation Oncology

## 2015-07-26 ENCOUNTER — Inpatient Hospital Stay: Admission: RE | Admit: 2015-07-26 | Payer: Medicaid Other | Source: Ambulatory Visit | Admitting: Radiation Oncology

## 2015-07-26 ENCOUNTER — Ambulatory Visit: Payer: Medicaid Other | Admitting: Radiation Oncology

## 2015-08-02 ENCOUNTER — Inpatient Hospital Stay: Admission: RE | Admit: 2015-08-02 | Payer: Medicaid Other | Source: Ambulatory Visit | Admitting: Radiation Oncology

## 2015-08-02 ENCOUNTER — Ambulatory Visit: Payer: Medicaid Other | Admitting: Radiation Oncology

## 2015-08-09 ENCOUNTER — Ambulatory Visit: Payer: Medicaid Other | Attending: Radiation Oncology | Admitting: Radiation Oncology

## 2015-08-17 ENCOUNTER — Other Ambulatory Visit: Payer: Self-pay

## 2015-08-17 MED ORDER — SERTRALINE HCL 100 MG PO TABS: 100.0000 mg | ORAL_TABLET | Freq: Every day | ORAL | Status: DC

## 2015-08-17 NOTE — Telephone Encounter (Signed)
I reviewed oncology note; doctor did not suggest changing dose of SSRI, and recommended leaving it the same; Rx approved --------------------- Please contact patient; ask her to please schedule an appointment to see me if she would please in the next month; thank you

## 2015-08-18 NOTE — Telephone Encounter (Signed)
Left voice message to inform patient that prescription has been filled and that she need to schedule appointment.

## 2015-08-30 ENCOUNTER — Other Ambulatory Visit: Payer: Self-pay

## 2015-08-30 MED ORDER — AMLODIPINE BESYLATE 5 MG PO TABS: 5.0000 mg | ORAL_TABLET | Freq: Every day | ORAL | Status: DC

## 2015-08-30 NOTE — Telephone Encounter (Signed)
I sent Rx as requested Please ask patient to schedule an appt in the next 3-4 weeks

## 2015-08-31 NOTE — Telephone Encounter (Signed)
LVM for pt to call the office to schedule an appt.

## 2015-09-03 ENCOUNTER — Encounter: Payer: Medicaid Other | Admitting: Nurse Practitioner

## 2015-09-13 ENCOUNTER — Encounter: Payer: Self-pay | Admitting: Nurse Practitioner

## 2015-09-13 DIAGNOSIS — C50412 Malignant neoplasm of upper-outer quadrant of left female breast: Secondary | ICD-10-CM

## 2015-09-13 NOTE — Progress Notes (Unsigned)
The Survivorship Care Plan was mailed to Elizabeth Hoffman as she reported not being able to come in to the Survivorship Clinic for an in-person visit at this time. A letter was mailed to her outlining the purpose of the content of the care plan, as well as encouraging her to reach out to me with any questions or concerns.  My business card was included in the correspondence to the patient as well.  A copy of the care plan was also routed/faxed/mailed to Elizabeth Lime, MD, the patient's PCP.  I will not be placing any follow-up appointments to the Survivorship Clinic for Elizabeth Hoffman, but I am happy to see her at any time in the future for any survivorship concerns that may arise. Thank you for allowing me to participate in her care!  Kenn File, [place] [phone]

## 2015-09-16 ENCOUNTER — Telehealth: Payer: Self-pay | Admitting: Family Medicine

## 2015-09-16 ENCOUNTER — Encounter: Payer: Self-pay | Admitting: Family Medicine

## 2015-09-16 ENCOUNTER — Ambulatory Visit (INDEPENDENT_AMBULATORY_CARE_PROVIDER_SITE_OTHER): Payer: Medicaid Other | Admitting: Family Medicine

## 2015-09-16 VITALS — BP 132/86 | HR 95 | Temp 98.7°F | Resp 16 | Wt 356.0 lb

## 2015-09-16 DIAGNOSIS — E785 Hyperlipidemia, unspecified: Secondary | ICD-10-CM

## 2015-09-16 DIAGNOSIS — I10 Essential (primary) hypertension: Secondary | ICD-10-CM

## 2015-09-16 DIAGNOSIS — K219 Gastro-esophageal reflux disease without esophagitis: Secondary | ICD-10-CM

## 2015-09-16 DIAGNOSIS — R7303 Prediabetes: Secondary | ICD-10-CM

## 2015-09-16 DIAGNOSIS — Z5181 Encounter for therapeutic drug level monitoring: Secondary | ICD-10-CM

## 2015-09-16 MED ORDER — HYDROCHLOROTHIAZIDE 25 MG PO TABS: 25.0000 mg | ORAL_TABLET | Freq: Every day | ORAL | Status: DC

## 2015-09-16 MED ORDER — SERTRALINE HCL 100 MG PO TABS: 100.0000 mg | ORAL_TABLET | Freq: Every day | ORAL | Status: DC

## 2015-09-16 MED ORDER — AMLODIPINE BESYLATE 5 MG PO TABS: 5.0000 mg | ORAL_TABLET | Freq: Every day | ORAL | Status: DC

## 2015-09-16 MED ORDER — NALTREXONE-BUPROPION HCL ER 8-90 MG PO TB12: ORAL_TABLET | ORAL | Status: DC

## 2015-09-16 MED ORDER — PANTOPRAZOLE SODIUM 40 MG PO TBEC: 40.0000 mg | DELAYED_RELEASE_TABLET | Freq: Every day | ORAL | Status: DC

## 2015-09-16 NOTE — Assessment & Plan Note (Signed)
Check fasting lipids

## 2015-09-16 NOTE — Assessment & Plan Note (Signed)
Check A1c and glucose; work on weight loss

## 2015-09-16 NOTE — Assessment & Plan Note (Signed)
Try dASH guidelines; continue current medicines; helping with weight management

## 2015-09-16 NOTE — Assessment & Plan Note (Signed)
Working together on weight loss; see AVS; start Contrave

## 2015-09-16 NOTE — Telephone Encounter (Signed)
Message sent to Dr. Lurline Del about adding Contrave Willing to decrease sertraline from 100 mg to 50 mg if he desires or has concerns Awaiting response

## 2015-09-16 NOTE — Progress Notes (Signed)
BP 132/86 mmHg  Pulse 95  Temp(Src) 98.7 F (37.1 C) (Oral)  Resp 16  Wt 356 lb (161.481 kg)  SpO2 96%   Subjective:    Patient ID: Elizabeth Hoffman, female    DOB: May 23, 1970, 45 y.o.   MRN: [id number]  HPI: ARACELYS Hoffman is a 45 y.o. female  Chief Complaint  Patient presents with  . Medication Refill   Hypertension; has had 5-1/2 years now; adopted; not much salt, just  occasionally; not much fast food; going to start eating better; no more soft drinks; trying to cut down on diet Mt Dew; hardly any bread; lost 100 pounds two times but gained it all back; has phentermine  Hx of breast cancer, left side; had lumpectomy and radiation therapy; goes back in July; still sore; everything is sore, not like that until 2-3 weeks ago; blistered and used silvadene; tissue in the there that is settling; tamoxifen for 5 years; just started mid-Feb 2017; going through hot flashes, possible from tamoxifen; worse with rushing around  Horrible sinus infection November 2016; usually one a year; this was worse than the normal infection; was brought up when asking about interim medical issues  Was having horrible issues with her periods; has appt with GYN next week; had hellacious periods for years; had LEEP procedure several years ago; ready for the uterus to come out; done having children; had yearly check-up and they ordered mammogram and had the mammogram and they found the cancer  Depression; doing well on sertraline; battled depression in high school; also anxiety over the top over a lot of stuff; the sertraline helps  GERD; on medicine for years; had endoscopy years ago; so bad if she forgets prescription, then she gets bad acid reflux  Got a tetanus shot 2 weeks ago  Depression screen [facility] 2/9 09/16/2015 04/06/2015 02/03/2015  Decreased Interest 0 0 0  Down, Depressed, Hopeless 0 0 0  PHQ - 2 Score 0 0 0   Relevant past medical, surgical, family and social history reviewed Past Medical  History  Diagnosis Date  . Arthritis   . Depression   . Hypertension   . Anxiety   . Hot flashes   . Breast cancer ([place]) 2016   Past Surgical History  Procedure Laterality Date  . Tonsillectomy and adenoidectomy    . Uvulectomy    . Cholecystectomy    . Hernia repair    . Cervical biopsy  w/ loop electrode excision    . Nasal septum surgery    . Mastectomy, partial Left 02/15/2015  . Breast lumpectomy with radioactive seed localization Left 03/18/2015    Procedure: BREAST LUMPECTOMY WITH RADIOACTIVE SEED LOCALIZATION;  Surgeon: Erroll Luna, MD;  Location: [facility];  Service: General;  Laterality: Left;   Family History  Problem Relation Age of Onset  . Adopted: Yes  . Hyperlipidemia Mother   . Hypertension Mother   . Hyperlipidemia Father   . Hypertension Father   did genetics testing and no gene for breast cancer  Social History  Substance Use Topics  . Smoking status: Never Smoker   . Smokeless tobacco: None  . Alcohol Use: No   Interim medical history since last visit reviewed. Allergies and medications reviewed  Review of Systems Per HPI unless specifically indicated above     Objective:    BP 132/86 mmHg  Pulse 95  Temp(Src) 98.7 F (37.1 C) (Oral)  Resp 16  Wt 356 lb (161.481 kg)  SpO2  96%  Wt Readings from Last 3 Encounters:  09/16/15 356 lb (161.481 kg)  07/05/15 360 lb 1.6 oz (163.34 kg)  05/18/15 358 lb 12.8 oz (162.751 kg)   body mass index is 52.55 kg/(m^2).  Physical Exam  Constitutional: She appears well-developed and well-nourished. No distress.  Morbidly obese  HENT:  Head: Normocephalic and atraumatic.  Eyes: EOM are normal. No scleral icterus.  Neck: No thyromegaly present.  Cardiovascular: Normal rate, regular rhythm and normal heart sounds.   Pulmonary/Chest: Effort normal and breath sounds normal.  Abdominal: Soft. She exhibits no distension.  Musculoskeletal: Normal range of motion. She exhibits no edema.    Neurological: She is alert. She exhibits normal muscle tone.  Skin: Skin is warm and dry. She is not diaphoretic. No pallor.  Psychiatric: She has a normal mood and affect. Her behavior is normal. Judgment and thought content normal.   Results for orders placed or performed during the hospital encounter of 04/29/15  CBC  Result Value Ref Range   WBC 7.9 3.6 - 11.0 K/uL   RBC 4.57 3.80 - 5.20 MIL/uL   Hemoglobin 12.1 12.0 - 16.0 g/dL   HCT 37.4 35.0 - 47.0 %   MCV 82.0 80.0 - 100.0 fL   MCH 26.5 26.0 - 34.0 pg   MCHC 32.3 32.0 - 36.0 g/dL   RDW 15.6 (H) 11.5 - 14.5 %   Platelets 212 150 - 440 K/uL      Assessment & Plan:   Problem List Items Addressed This Visit      Cardiovascular and Mediastinum   Hypertension goal BP (blood pressure) < 140/90 - Primary    Try dASH guidelines; continue current medicines; helping with weight management      Relevant Medications   hydrochlorothiazide (HYDRODIURIL) 25 MG tablet   amLODipine (NORVASC) 5 MG tablet     Digestive   GERD without esophagitis    Cautions given, check H pylori      Relevant Medications   pantoprazole (PROTONIX) 40 MG tablet   Other Relevant Orders   H Pylori, IGM, IGG, IGA AB     Other   Hyperlipidemia LDL goal <100    Check fasting lipids      Relevant Medications   hydrochlorothiazide (HYDRODIURIL) 25 MG tablet   amLODipine (NORVASC) 5 MG tablet   Other Relevant Orders   Lipid Panel w/o Chol/HDL Ratio   Obesity, morbid, BMI 50 or higher ([place])    Working together on weight loss; see AVS; start Contrave      Relevant Medications   Naltrexone-Bupropion HCl ER 8-90 MG TB12   Other Relevant Orders   TSH   Pre-diabetes    Check A1c and glucose; work on weight loss      Relevant Orders   Hgb A1c w/o eAG    Other Visit Diagnoses    Medication monitoring encounter        Relevant Orders    Comprehensive metabolic panel        Follow up plan: Return in about 4 weeks (around 10/14/2015) for  medicine follow-up.  An after-visit summary was printed and given to the patient at [facility].  Please see the patient instructions which may contain other information and recommendations beyond what is mentioned above in the assessment and plan.  Meds ordered this encounter  Medications  . sertraline (ZOLOFT) 100 MG tablet    Sig: Take 1 tablet (100 mg total) by mouth daily.    Dispense:  90 tablet  Refill:  3  . pantoprazole (PROTONIX) 40 MG tablet    Sig: Take 1 tablet (40 mg total) by mouth daily.    Dispense:  90 tablet    Refill:  3  . hydrochlorothiazide (HYDRODIURIL) 25 MG tablet    Sig: Take 1 tablet (25 mg total) by mouth daily.    Dispense:  90 tablet    Refill:  3    CYCLE FILL MEDICATION. Authorization is required for next refill.  Marland Kitchen amLODipine (NORVASC) 5 MG tablet    Sig: Take 1 tablet (5 mg total) by mouth daily.    Dispense:  90 tablet    Refill:  3  . Naltrexone-Bupropion HCl ER 8-90 MG TB12    Sig: One by mouth daily x 1 week, then 1 pill BID x 1 week, then 2 pills in AM and 1 pill in PM x 1 week, then 2 pills BID    Dispense:  78 tablet    Refill:  0    Orders Placed This Encounter  Procedures  . Hgb A1c w/o eAG  . Comprehensive metabolic panel  . Lipid Panel w/o Chol/HDL Ratio  . TSH  . H Pylori, IGM, IGG, IGA AB

## 2015-09-16 NOTE — Patient Instructions (Addendum)
Check out the information at familydoctor.org entitled "Nutrition for Weight Loss" Try to lose between 1-2 pounds per week by taking in fewer calories and burning off more calories You can succeed by limiting portions, limiting foods dense in calories and fat, becoming more active, and drinking 8 glasses of water a day (64 ounces) Don't skip meals, especially breakfast, as skipping meals may alter your metabolism Do not use over-the-counter weight loss pills or gimmicks that claim rapid weight loss A healthy BMI (or body mass index) is between 18.5 and 24.9 You can calculate your ideal BMI at the [place] website ClubMon[employer]fr  Start Contrave and return in 4 weeks after starting medicine Go have fasting labs another day in the next week or so

## 2015-09-16 NOTE — Assessment & Plan Note (Signed)
Cautions given, check H pylori

## 2015-09-20 NOTE — Telephone Encounter (Signed)
Elizabeth Hoffman, please call patient and ask her to decrease her sertraline from 100 to 50 mg at the end of the first week of the Contrave taper She'll still take 100 mg daily of sertraline for the first 7 days of the new weight loss medicine, then she'll cut the sertraline down to 50 mg on day 8 Call with any issues Sertraline has a very long half life, so I don't expect she'll notice anything Thank you

## 2015-09-20 NOTE — Telephone Encounter (Signed)
Pt.notified

## 2015-09-20 NOTE — Telephone Encounter (Signed)
Claudie, [place] Female October 16, 1970 [id number] [facility] [address] [place] [id number] [phone] (Home)       RE: Question about Contrave  Received: 3 days ago    Chauncey Cruel, MD  Arnetha Courser, MD           This is a vexing question because while there is a definite effect on metabolism it is not clear there is any lessening of clinical results -- in any case bupropion is not a "strong" metabolizer, so no problem from my end       Previous Messages     ----- Message -----   From: Arnetha Courser, MD   Sent: 09/16/2015  5:31 PM    To: Chauncey Cruel, MD  Subject: Question about Heide Spark! I read your note dated 07/05/15 about antidepressants and Tamoxifen. Just wanted to make sure okay to add Contrave to her regimen. It contains bupropion, and it would be a taper up 90 mg first week up to 360 mg total. This would be in addition to sertraline. I can reduce the sertraline to 50 mg if any concerns. Thanks!!

## 2015-09-23 ENCOUNTER — Ambulatory Visit: Payer: Medicaid Other | Admitting: Oncology

## 2015-09-24 ENCOUNTER — Telehealth: Payer: Self-pay | Admitting: Oncology

## 2015-09-24 NOTE — Telephone Encounter (Signed)
Patient called wanting to schedule and earlier appt due to some concerns she is having. Sched next available appt with GM 6/1 at 930 am

## 2015-09-27 ENCOUNTER — Telehealth: Payer: Self-pay | Admitting: Family Medicine

## 2015-09-27 NOTE — Telephone Encounter (Signed)
Was prescribed Contrave and insurance will not cover it. Would like to know if you could write it in a way that they will cover the medication or if there is an alternative.

## 2015-09-29 ENCOUNTER — Other Ambulatory Visit: Payer: Self-pay | Admitting: Oncology

## 2015-10-02 DIAGNOSIS — N939 Abnormal uterine and vaginal bleeding, unspecified: Secondary | ICD-10-CM | POA: Insufficient documentation

## 2015-10-06 NOTE — Telephone Encounter (Signed)
I don't know of any other way to prescribe it; I thought the savings cards obviated the need for coverage (?); did she have a savings card? Ask her to check with her insurance company to see what they DO cover; thanks

## 2015-10-06 NOTE — Telephone Encounter (Signed)
(  sorry, the note was already closed when it came to me by the way)

## 2015-10-14 ENCOUNTER — Ambulatory Visit (HOSPITAL_BASED_OUTPATIENT_CLINIC_OR_DEPARTMENT_OTHER): Payer: Medicaid Other | Admitting: Oncology

## 2015-10-14 VITALS — BP 140/74 | HR 73 | Temp 98.1°F | Resp 18 | Ht 69.0 in | Wt 355.9 lb

## 2015-10-14 DIAGNOSIS — C50412 Malignant neoplasm of upper-outer quadrant of left female breast: Secondary | ICD-10-CM

## 2015-10-14 DIAGNOSIS — D0512 Intraductal carcinoma in situ of left breast: Secondary | ICD-10-CM | POA: Diagnosis not present

## 2015-10-14 MED ORDER — TAMOXIFEN CITRATE 20 MG PO TABS: 20.0000 mg | ORAL_TABLET | Freq: Every day | ORAL | Status: DC

## 2015-10-14 NOTE — Progress Notes (Signed)
Elizabeth Hoffman  [phone] [phone]     ID: Elizabeth Hoffman DOB: Dec 17, 1970  MR#: [id number]  QMV#:[id number]  Patient Care Team: Arnetha Courser, MD as PCP - General (Family Medicine) Erroll Luna, MD as Consulting Physician (General Surgery) Chauncey Cruel, MD as Consulting Physician (Oncology) Gery Pray, MD as Consulting Physician (Radiation Oncology) Rockwell Germany, RN as Registered Nurse Mauro Kaufmann, RN as Registered Nurse Will Bonnet, MD as Attending Physician (Obstetrics and Gynecology) Sylvan Cheese, NP as Nurse Practitioner (Hematology and Oncology) PCP: Enid Derry, MD OTHER MD: Berton Mount M.D.  CHIEF COMPLAINT: Ductal carcinoma in situ, estrogen receptor positive  CURRENT TREATMENT: tamoxifen   BREAST CANCER HISTORY: From the original intake note:  Elizabeth Hoffman was having mild bilateral mastalgia and discuss this with her primary care physician, but her screening bilateral mammography at [facility] 02/09/2015 was routinely scheduled. New calcifications in the left breast were noted and evaluated further 02/15/2015 with a left diagnostic mammogram. This showed a linear group of calcifications in the upper outer quadrant of the left breast extending over an area of approximately 3.1 cm.. There was no discrete mass. The breast density was category B.  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  Her subsequent history is as detailed below  INTERVAL HISTORY: Kenyonna returns today for follow-up of her ductal carcinoma in situ. She has been on tamoxifen now a little over 3 months. She is tolerating it remarkably well. She does have hot flashes about 2 or 3 times a week and they do cause her to sweat. These are not a big deal for her however. She gets hot at night but  has a ceiling fan and a local friend that she uses and that "takes care of it". She obtains a drug at a very good price.  She has a Mirena IUD in place. Mostly she has no periods though occasionally there is a little bit of "breakthrough" bleeding.   REVIEW OF SYSTEMS: Elizabeth Hoffman  has some discomfort in the surgical (left) breast, which she describes as stabbing or throbbing. There is a little bit of a change in the breast contour from the surgery. She is currently not exercising. She is not following a particular diet. Her primary care physician started her on naltrexone but this was not covered by her insurance (Medicaid) and she was not able to afford it. A detailed review of systems today was otherwise stable  PAST MEDICAL HISTORY: Past Medical History  Diagnosis Date  . Arthritis   . Depression   . Hypertension   . Anxiety   . Hot flashes   . Breast cancer ([place]) 2016    PAST SURGICAL HISTORY: Past Surgical History  Procedure Laterality Date  . Tonsillectomy and adenoidectomy    . Uvulectomy    . Cholecystectomy    . Hernia repair    . Cervical biopsy  w/ loop electrode excision    . Nasal septum surgery    . Mastectomy, partial Left 02/15/2015  . Breast lumpectomy with radioactive seed localization Left 03/18/2015    Procedure: BREAST LUMPECTOMY WITH RADIOACTIVE SEED LOCALIZATION;  Surgeon: Erroll Luna, MD;  Location: [place];  Service: General;  Laterality: Left;    FAMILY HISTORY Family History  Problem Relation Age of Onset  . Adopted: Yes  . Hyperlipidemia Mother   . Hypertension Mother   .  Hyperlipidemia Father   . Hypertension Father    the patient is adopted and has no information on her biological family  GYNECOLOGIC HISTORY:  No LMP recorded. Patient is not currently having periods (Reason: Irregular Periods). Menarche age 45, first live birth age 45. She is GX P3. She still having regular periods. She has a Mirena IUD in place. She never  used oral contraceptives.  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 Her husband is Elizabeth Hoffman "[employer]. At home it's the 2 of them plus Elizabeth Hoffman and Elizabeth Hoffman, age 43 and 61 respectively, and Elizabeth Hoffman, age 64. 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    ADVANCED DIRECTIVES: Not in place   HEALTH MAINTENANCE: Social History  Substance Use Topics  . Smoking status: Never Smoker   . Smokeless tobacco: Not on file  . Alcohol Use: No     Colonoscopy:  PAP: September 2016  Bone density:  Lipid panel:  No Known Allergies  Current Outpatient Prescriptions  Medication Sig Dispense Refill  . amLODipine (NORVASC) 5 MG tablet Take 1 tablet (5 mg total) by mouth daily. 90 tablet 3  . hydrochlorothiazide (HYDRODIURIL) 25 MG tablet Take 1 tablet (25 mg total) by mouth daily. 90 tablet 3  . Naltrexone-Bupropion HCl ER 8-90 MG TB12 One by mouth daily x 1 week, then 1 pill BID x 1 week, then 2 pills in AM and 1 pill in PM x 1 week, then 2 pills BID 78 tablet 0  . pantoprazole (PROTONIX) 40 MG tablet Take 1 tablet (40 mg total) by mouth daily. 90 tablet 3  . sertraline (ZOLOFT) 100 MG tablet Take 1 tablet (100 mg total) by mouth daily. 90 tablet 3  . tamoxifen (NOLVADEX) 20 MG tablet TAKE 1 TABLET (20 MG TOTAL) BY MOUTH DAILY. 30 tablet 2   No current facility-administered medications for this visit.    OBJECTIVE: Middle-aged white woman In no acute distress Filed Vitals:   10/14/15 0921  BP: 140/74  Pulse: 73  Temp: 98.1 F (36.7 C)  Resp: 18     Body mass index is 52.53 kg/(m^2).    ECOG FS:0 - Asymptomatic  Sclerae unicteric, pupils round and equal Oropharynx clear and moist-- no thrush or other lesions No cervical or supraclavicular adenopathy Lungs no rales or rhonchi Heart regular rate and rhythm Abd soft, obese, nontender, positive bowel sounds MSK no focal spinal tenderness, no upper extremity lymphedema Neuro: nonfocal, well  oriented, appropriate affect Breasts: The right breast is unremarkable. The left breast is status post radiation as well as surgery. There is a minimal duskiness remaining. There is some skin thickening and also some nipple thickening, as expected postradiation treatment. There is no evidence of disease recurrence. The left axilla is benign.    LAB RESULTS:  CMP     Component Value Date/Time   NA 141 03/15/2015 1453   NA 141 02/24/2015 1229   NA 141 02/04/2015 0905   NA 139 09/23/2013 1231   K 3.9 03/15/2015 1453   K 3.7 02/24/2015 1229   K 3.8 09/23/2013 1231   CL 104 03/15/2015 1453   CL 107 09/23/2013 1231   CO2 27 03/15/2015 1453   CO2 25 02/24/2015 1229   CO2 27 09/23/2013 1231   GLUCOSE 125* 03/15/2015 1453   GLUCOSE 114 02/24/2015 1229   GLUCOSE 97 02/04/2015 0905   GLUCOSE 102* 09/23/2013 1231   BUN 10 03/15/2015 1453   BUN 10.9 02/24/2015  1229   BUN 13 02/04/2015 0905   BUN 14 09/23/2013 1231   CREATININE 1.03* 03/15/2015 1453   CREATININE 0.9 02/24/2015 1229   CREATININE 1.16 09/23/2013 1231   CALCIUM 10.0 03/15/2015 1453   CALCIUM 9.5 02/24/2015 1229   CALCIUM 9.2 09/23/2013 1231   PROT 7.1 03/15/2015 1453   PROT 7.5 02/24/2015 1229   PROT 7.5 02/04/2015 0905   ALBUMIN 3.6 03/15/2015 1453   ALBUMIN 3.7 02/24/2015 1229   ALBUMIN 4.3 02/04/2015 0905   AST 25 03/15/2015 1453   AST 29 02/24/2015 1229   ALT 26 03/15/2015 1453   ALT 32 02/24/2015 1229   ALKPHOS 72 03/15/2015 1453   ALKPHOS 77 02/24/2015 1229   BILITOT 0.4 03/15/2015 1453   BILITOT 0.30 02/24/2015 1229   BILITOT 0.3 02/04/2015 0905   GFRNONAA >60 03/15/2015 1453   GFRNONAA 58* 09/23/2013 1231   GFRAA >60 03/15/2015 1453   GFRAA >60 09/23/2013 1231    INo results found for: SPEP, UPEP  Lab Results  Component Value Date   WBC 7.9 04/29/2015   NEUTROABS 6.4 03/15/2015   HGB 12.1 04/29/2015   HCT 37.4 04/29/2015   MCV 82.0 04/29/2015   PLT 212 04/29/2015      Chemistry        Component Value Date/Time   NA 141 03/15/2015 1453   NA 141 02/24/2015 1229   NA 141 02/04/2015 0905   NA 139 09/23/2013 1231   K 3.9 03/15/2015 1453   K 3.7 02/24/2015 1229   K 3.8 09/23/2013 1231   CL 104 03/15/2015 1453   CL 107 09/23/2013 1231   CO2 27 03/15/2015 1453   CO2 25 02/24/2015 1229   CO2 27 09/23/2013 1231   BUN 10 03/15/2015 1453   BUN 10.9 02/24/2015 1229   BUN 13 02/04/2015 0905   BUN 14 09/23/2013 1231   CREATININE 1.03* 03/15/2015 1453   CREATININE 0.9 02/24/2015 1229   CREATININE 1.16 09/23/2013 1231      Component Value Date/Time   CALCIUM 10.0 03/15/2015 1453   CALCIUM 9.5 02/24/2015 1229   CALCIUM 9.2 09/23/2013 1231   ALKPHOS 72 03/15/2015 1453   ALKPHOS 77 02/24/2015 1229   AST 25 03/15/2015 1453   AST 29 02/24/2015 1229   ALT 26 03/15/2015 1453   ALT 32 02/24/2015 1229   BILITOT 0.4 03/15/2015 1453   BILITOT 0.30 02/24/2015 1229   BILITOT 0.3 02/04/2015 0905       No results found for: LABCA2  No components found for: [id number]  No results for input(s): INR in the last 168 hours.  Urinalysis No results found for: COLORURINE, APPEARANCEUR, LABSPEC, PHURINE, GLUCOSEU, HGBUR, BILIRUBINUR, KETONESUR, PROTEINUR, UROBILINOGEN, NITRITE, LEUKOCYTESUR  STUDIES: No results found.  ASSESSMENT: 45 y.o. BRCA negative [place], [place] woman s/p left breast upper outer quadrant biopsy 02/18/2015 for a ductal carcinoma in situ, grade 2 or 3, estrogen and progesterone receptor positive  (1) status post left lumpectomy 03/18/2015 for ductal carcinoma in situ, grade 2, final margins negative  (2) adjuvant radiation completed late January 2017  (3) tamoxifen to start 06/29/2015  (a) the patient is considering a hysterectomy with bilateral salpingo-oophorectomy  (4) genetics testing 03/05/2015 through the Breast/Ovarian cancer gene panel found no deleterious mutations in: ATM, BARD1, BRCA1, BRCA2, BRIP1, CDH1, CHEK2, EPCAM, FANCC, MLH1, MSH2, MSH6,  NBN, PALB2, PMS2, PTEN, RAD51C, RAD51D, TP53, and XRCC2.   PLAN: Glynda Is tolerating tamoxifen well and the plan will be to continue that for a  total of 5 years.  She has a Mirena IUD in place and this actually helps decrease the risk of endometrial polyps or endometrial thickening developing while on tamoxifen.  Her weight continues to be a major concern. Today we again discussed bariatric surgery. She is willing to consider it. I have placed a referral to Dr. Brantley Stage to discuss that further with her.  Otherwise she will see me again in one year. She knows to call for any problems that may develop before then.  Chauncey Cruel, MD   10/14/2015 9:37 AM Medical Oncology and Hematology [facility] [address] [place], [place] [address] Tel. [phone]    Fax. [phone]

## 2015-10-18 ENCOUNTER — Ambulatory Visit: Payer: Medicaid Other | Admitting: Family Medicine

## 2015-10-26 ENCOUNTER — Other Ambulatory Visit: Payer: Self-pay | Admitting: Oncology

## 2015-11-15 ENCOUNTER — Ambulatory Visit: Payer: Medicaid Other | Admitting: Family Medicine

## 2015-11-30 ENCOUNTER — Encounter: Payer: Self-pay | Admitting: Emergency Medicine

## 2015-11-30 ENCOUNTER — Emergency Department: Payer: Medicaid Other

## 2015-11-30 ENCOUNTER — Encounter: Payer: Self-pay | Admitting: Family Medicine

## 2015-11-30 ENCOUNTER — Ambulatory Visit (INDEPENDENT_AMBULATORY_CARE_PROVIDER_SITE_OTHER): Payer: Medicaid Other | Admitting: Family Medicine

## 2015-11-30 ENCOUNTER — Telehealth: Payer: Self-pay | Admitting: Family Medicine

## 2015-11-30 ENCOUNTER — Emergency Department
Admission: EM | Admit: 2015-11-30 | Discharge: 2015-12-01 | Disposition: A | Discharge status: Home / Self Care | Payer: Medicaid Other | Attending: Emergency Medicine | Admitting: Emergency Medicine

## 2015-11-30 VITALS — BP 140/80 | HR 82 | Temp 99.0°F | Resp 14 | Wt 361.0 lb

## 2015-11-30 DIAGNOSIS — E785 Hyperlipidemia, unspecified: Secondary | ICD-10-CM | POA: Insufficient documentation

## 2015-11-30 DIAGNOSIS — Z79899 Other long term (current) drug therapy: Secondary | ICD-10-CM | POA: Diagnosis not present

## 2015-11-30 DIAGNOSIS — Z01818 Encounter for other preprocedural examination: Secondary | ICD-10-CM

## 2015-11-30 DIAGNOSIS — Z853 Personal history of malignant neoplasm of breast: Secondary | ICD-10-CM | POA: Diagnosis not present

## 2015-11-30 DIAGNOSIS — N921 Excessive and frequent menstruation with irregular cycle: Secondary | ICD-10-CM

## 2015-11-30 DIAGNOSIS — I10 Essential (primary) hypertension: Secondary | ICD-10-CM | POA: Diagnosis not present

## 2015-11-30 DIAGNOSIS — M79605 Pain in left leg: Secondary | ICD-10-CM | POA: Diagnosis not present

## 2015-11-30 DIAGNOSIS — R7301 Impaired fasting glucose: Secondary | ICD-10-CM

## 2015-11-30 DIAGNOSIS — M1990 Unspecified osteoarthritis, unspecified site: Secondary | ICD-10-CM | POA: Insufficient documentation

## 2015-11-30 DIAGNOSIS — R791 Abnormal coagulation profile: Secondary | ICD-10-CM | POA: Diagnosis not present

## 2015-11-30 DIAGNOSIS — R7989 Other specified abnormal findings of blood chemistry: Secondary | ICD-10-CM

## 2015-11-30 DIAGNOSIS — F3342 Major depressive disorder, recurrent, in full remission: Secondary | ICD-10-CM | POA: Insufficient documentation

## 2015-11-30 LAB — LIPID PANEL
Cholesterol: 168 mg/dL (ref 125–200)
HDL: 48 mg/dL (ref >=46)
LDL CALC: 87 mg/dL (ref <130)
TRIGLYCERIDES: 166 mg/dL — AB (ref <150)
Total CHOL/HDL Ratio: 3.5 Ratio (ref <=5.0)
VLDL: 33 mg/dL — ABNORMAL HIGH (ref <30)

## 2015-11-30 LAB — COMPREHENSIVE METABOLIC PANEL
ALT: 24 U/L (ref 6–29)
AST: 24 U/L (ref 10–30)
Albumin: 3.8 g/dL (ref 3.6–5.1)
Alkaline Phosphatase: 53 U/L (ref 33–115)
BUN: 16 mg/dL (ref 7–25)
CO2: 27 mmol/L (ref 20–31)
Calcium: 9 mg/dL (ref 8.6–10.2)
Chloride: 105 mmol/L (ref 98–110)
Creat: 1.04 mg/dL (ref 0.50–1.10)
GLUCOSE: 98 mg/dL (ref 65–99)
Potassium: 4.2 mmol/L (ref 3.5–5.3)
SODIUM: 139 mmol/L (ref 135–146)
Total Bilirubin: 0.3 mg/dL (ref 0.2–1.2)
Total Protein: 6.8 g/dL (ref 6.1–8.1)

## 2015-11-30 LAB — CBC WITH DIFFERENTIAL/PLATELET
BASOS ABS: 0 {cells}/uL (ref 0–200)
Basophils Relative: 0 %
EOS PCT: 4 %
Eosinophils Absolute: 272 cells/uL (ref 15–500)
HCT: 38.1 % (ref 35.0–45.0)
HEMOGLOBIN: 12.3 g/dL (ref 11.7–15.5)
LYMPHS ABS: 2040 {cells}/uL (ref 850–3900)
LYMPHS PCT: 30 %
MCH: 27.3 pg (ref 27.0–33.0)
MCHC: 32.3 g/dL (ref 32.0–36.0)
MCV: 84.5 fL (ref 80.0–100.0)
MPV: 10.4 fL (ref 7.5–12.5)
Monocytes Absolute: 408 cells/uL (ref 200–950)
Monocytes Relative: 6 %
NEUTROS PCT: 60 %
Neutro Abs: 4080 cells/uL (ref 1500–7800)
Platelets: 236 10*3/uL (ref 140–400)
RBC: 4.51 MIL/uL (ref 3.80–5.10)
RDW: 14.7 % (ref 11.0–15.0)
WBC: 6.8 10*3/uL (ref 3.8–10.8)

## 2015-11-30 LAB — D-DIMER, QUANTITATIVE: D-Dimer, Quant: 3.86 mcg/mL FEU — ABNORMAL HIGH (ref <0.50)

## 2015-11-30 LAB — PROTIME-INR
INR: 1
Prothrombin Time: 10.8 s (ref 9.0–11.5)

## 2015-11-30 LAB — TSH: TSH: 4.6 mIU/L — ABNORMAL HIGH

## 2015-11-30 LAB — APTT: aPTT: 26 s (ref 22–34)

## 2015-11-30 MED ORDER — AMLODIPINE BESYLATE 5 MG PO TABS: 7.5000 mg | ORAL_TABLET | Freq: Every day | ORAL | Status: DC

## 2015-11-30 NOTE — Assessment & Plan Note (Signed)
Check lipids

## 2015-11-30 NOTE — ED Provider Notes (Signed)
[facility] Emergency Department Provider Note   ____________________________________________  Time seen: Approximately 11:13 PM  I have reviewed the triage vital signs and the nursing notes.   HISTORY  Chief Complaint Abnormal Lab    HPI Elizabeth Hoffman is a 45 y.o. female comes into the hospital today after being told by her doctor to come in. She reports that she had some routine blood work done today in preparation for surgery. She reports that she was called at 7 PM and was told that her test for blood Clots came back positive. She reports that her doctor told her that she needed to come in and get checked out for a blood clot immediately. She reports that she's not had new chest pain or shortness of breath. She reports that she did have some swelling to her left ankle on and off last week but she reports that she had been traveling and that is not abnormal for her. She does have a history of breast cancer but she finished treatment. She reports that she did have some pain to her left knee as well earlier this week but that's gone. She reports that she has no other complaints but is here at the request of her physician.   Past Medical History  Diagnosis Date  . Arthritis   . Depression   . Hypertension   . Anxiety   . Hot flashes   . Breast cancer ([place]) 2016  . Breast cancer [facility])     Patient Active Problem List   Diagnosis Date Noted  . Obesity, morbid, BMI 50 or higher ([place]) 07/05/2015  . Need for immunization against influenza 04/06/2015  . Genetic testing 03/08/2015  . Breast cancer of upper-outer quadrant of left female breast ([place]) 02/22/2015  . Hyperlipidemia LDL goal <100 02/08/2015  . GERD without esophagitis 02/03/2015  . Menorrhagia with irregular cycle 02/03/2015  . Major depressive disorder, recurrent, in full remission ([place]) 02/03/2015  . Hypertension goal BP (blood pressure) < 140/90 02/03/2015  . De Quervain's tenosynovitis,  bilateral 02/03/2015  . Numerous moles 02/03/2015  . Pre-diabetes 02/03/2015  . History of loop electrosurgical excision procedure (LEEP) of cervix 02/03/2015  . Encounter for screening mammogram for malignant neoplasm of breast 02/03/2015  . Elevated fasting glucose 02/03/2015  . Encounter for cholesteral screening for cardiovascular disease 02/03/2015    Past Surgical History  Procedure Laterality Date  . Tonsillectomy and adenoidectomy    . Uvulectomy    . Cholecystectomy    . Hernia repair    . Cervical biopsy  w/ loop electrode excision    . Nasal septum surgery    . Mastectomy, partial Left 02/15/2015  . Breast lumpectomy with radioactive seed localization Left 03/18/2015    Procedure: BREAST LUMPECTOMY WITH RADIOACTIVE SEED LOCALIZATION;  Surgeon: Erroll Luna, MD;  Location: [place];  Service: General;  Laterality: Left;    Current Outpatient Rx  Name  Route  Sig  Dispense  Refill  . amLODipine (NORVASC) 5 MG tablet   Oral   Take 1.5 tablets (7.5 mg total) by mouth daily. Patient taking differently: Take 5 mg by mouth daily.    135 tablet   3     New instructions, higher amount   . hydrochlorothiazide (HYDRODIURIL) 25 MG tablet   Oral   Take 1 tablet (25 mg total) by mouth daily.   90 tablet   3     CYCLE FILL MEDICATION. Authorization is required f ...   Marland Kitchen  pantoprazole (PROTONIX) 40 MG tablet   Oral   Take 1 tablet (40 mg total) by mouth daily.   90 tablet   3   . sertraline (ZOLOFT) 100 MG tablet   Oral   Take 1 tablet (100 mg total) by mouth daily.   90 tablet   3   . tamoxifen (NOLVADEX) 20 MG tablet   Oral   Take 1 tablet (20 mg total) by mouth daily.   90 tablet   12     CYCLE FILL MEDICATION. Authorization is required f ...     Allergies Review of patient's allergies indicates no known allergies.  Family History  Problem Relation Age of Onset  . Adopted: Yes  . Family history unknown: Yes    Social  History Social History  Substance Use Topics  . Smoking status: Never Smoker   . Smokeless tobacco: None  . Alcohol Use: No    Review of Systems Constitutional: No fever/chills Eyes: No visual changes. ENT: No sore throat. Cardiovascular: Denies chest pain. Respiratory: Denies shortness of breath. Gastrointestinal: No abdominal pain.  No nausea, no vomiting.  No diarrhea.  No constipation. Genitourinary: Negative for dysuria. Musculoskeletal: Left ankle swelling, left knee pain Skin: Negative for rash. Neurological: Negative for headaches, focal weakness or numbness.  10-point ROS otherwise negative.  ____________________________________________   PHYSICAL EXAM:  VITAL SIGNS: ED Triage Vitals  Enc Vitals Group     BP 11/30/15 2040 149/77 mmHg     Pulse Rate 11/30/15 2040 72     Resp 11/30/15 2040 18     Temp 11/30/15 2040 98.8 F (37.1 C)     Temp Source 11/30/15 2040 Oral     SpO2 11/30/15 2038 96 %     Weight 11/30/15 2040 363 lb (164.656 kg)     Height 11/30/15 2040 5' 9"  (1.753 m)     Head Cir --      Peak Flow --      Pain Score 11/30/15 2041 0     Pain Loc --      Pain Edu? --      Excl. in [place]? --     Constitutional: Alert and oriented. Well appearing and in no acute distress. Eyes: Conjunctivae are normal. PERRL. EOMI. Head: Atraumatic. Nose: No congestion/rhinnorhea. Mouth/Throat: Mucous membranes are moist.  Oropharynx non-erythematous. Cardiovascular: Normal rate, regular rhythm. Grossly normal heart sounds.  Good peripheral circulation. Respiratory: Normal respiratory effort.  No retractions. Lungs CTAB. Gastrointestinal: Soft and nontender. No distention. Positive bowel sounds Musculoskeletal: No lower extremity tenderness nor edema.   Neurologic:  Normal speech and language.  Skin:  Skin is warm, dry and intact.  Psychiatric: Mood and affect are normal.   ____________________________________________   LABS (all labs ordered are listed, but  only abnormal results are displayed)  Labs Reviewed - No data to display ____________________________________________  EKG  None ____________________________________________  RADIOLOGY  US venous img lower unilateral left: No evidence of left lower extremity DVT ____________________________________________   PROCEDURES  Procedure(s) performed: None  Procedures  Critical Care performed: No  ____________________________________________   INITIAL IMPRESSION / ASSESSMENT AND PLAN / ED COURSE  Pertinent labs & imaging results that were available during my care of the patient were reviewed by me and considered in my medical decision making (see chart for details).  This is a 45 year old female who comes into the hospital today with an abnormal d-dimer. The patient's physician looking back at her notes would like for the patient to  have an ultrasound. I will ultrasound the patient's left lower extremity looking for DVT. The patient has no complaints at this time.  The patient will be discharged home to follow-up with her primary care physician. ____________________________________________   FINAL CLINICAL IMPRESSION(S) / ED DIAGNOSES  Final diagnoses:  Positive D-dimer  Pain of left lower extremity      NEW MEDICATIONS STARTED DURING THIS VISIT:  Discharge Medication List as of 12/01/2015 12:37 AM       Note:  This document was prepared using Dragon voice recognition software and may include unintentional dictation errors.    Loney Hering, MD 12/01/15 0110

## 2015-11-30 NOTE — Assessment & Plan Note (Signed)
Work on weight loss, see AVS; discussed bariatric surgery, will put in referral

## 2015-11-30 NOTE — Assessment & Plan Note (Signed)
Cleared for surgery after labs completed

## 2015-11-30 NOTE — Assessment & Plan Note (Signed)
DASH guidelines; weight loss; increase amlodipine

## 2015-11-30 NOTE — Telephone Encounter (Signed)
Patient's D-dimer is positive, so I called her number and left detailed message; I'd like her to go now to the ER to have that evaluated, need to make sure she doesn't have a blood clot in her leg; I will see if I can find any other numbers ----------------------- I cannot find an "FYI" button at the top; I cannot find a DPR note I found a cancer center release of info page from Oct 2016 with names but no phone numbers

## 2015-11-30 NOTE — ED Notes (Signed)
Patient transported to Ultrasound 

## 2015-11-30 NOTE — Assessment & Plan Note (Signed)
Check a1c, work on weight loss

## 2015-11-30 NOTE — ED Notes (Signed)
Pt arrived to the ED after being reffered by her Dr. For having an elevated D-Dimmer. Pt states that she was getting "blood work for surgery and they noticed that her D-Dimmer was elevated. Pt is AOx4 in no apparent distress.

## 2015-11-30 NOTE — Patient Instructions (Signed)
Increase the amlodipine to 7.5 mg daily (that's 1.5 of the 5 mg pills) Your goal blood pressure is less than 140 mmHg on top and less than 90 on the bottom Try to follow the DASH guidelines (DASH stands for Dietary Approaches to Stop Hypertension) Try to limit the sodium in your diet.  Ideally, consume less than 1.5 grams (less than 1,535m) per day. Do not add salt when cooking or at the table.  Check the sodium amount on labels when shopping, and choose items lower in sodium when given a choice. Avoid or limit foods that already contain a lot of sodium. Eat a diet rich in fruits and vegetables and whole grains. Monitor your blood pressure once a week and call if not under 140/90  Check out the information at familydoctor.org entitled "Nutrition for Weight Loss: What You Need to Know about Fad Diets" Try to lose between 1-2 pounds per week by taking in fewer calories and burning off more calories You can succeed by limiting portions, limiting foods dense in calories and fat, becoming more active, and drinking 8 glasses of water a day (64 ounces) Don't skip meals, especially breakfast, as skipping meals may alter your metabolism Do not use over-the-counter weight loss pills or gimmicks that claim rapid weight loss A healthy BMI (or body mass index) is between 18.5 and 24.9 You can calculate your ideal BMI at the [place] [facility] [employer].fr We'll refer you to the bariatric surgeon I expect to be able to clear you for surgery after we get your labs back

## 2015-11-30 NOTE — ED Notes (Addendum)
Pt came to ED sent by PCP for elevated D-Dimer. Pt states she had swelling to left ankle after traveling which is normal per patient. No swelling noted. No redness noted. Pt denies any SOB/chest pain or numbness and tingling. A&Ox4

## 2015-11-30 NOTE — Progress Notes (Signed)
BP 140/80 mmHg  Pulse 82  Temp(Src) 99 F (37.2 C) (Oral)  Resp 14  Wt 361 lb (163.749 kg)  SpO2 96%   Subjective:    Patient ID: Elizabeth Hoffman, female    DOB: [birth year], 45 y.o.   MRN: [id number]  HPI: Elizabeth Hoffman is a 45 y.o. female  Chief Complaint  Patient presents with  . Pre-op Exam    for hysterectomy   Patient is going to have a hysterectomy; they are debating about her ovaries; patient would like everything to go, but gyn wants to leave the ovaries; they did an [place] already; left ovary is way down low, right ovary is way high; several cysts  She has had surgeries before and no problems with anesthesia No current boils or sores No burning with urination No easy bruising or bleeding No SHOB, no chest pain Little bit of swelling in the left ankle; more than right; comes and goes; no redness or heat of the leg   She has tried several things to try to lose weight; she lost 100 pounds going to the gym and went to bariatric clinic and they gave her some kind of appetite suppressant, lost 100 pounds, then got pregnant and started to gain again; she is interestd in bariatric surgery; has been fighting her weight for years; hard to exercise; functioning and stays busy and runs after her kids and takes care of her animals, but not able to actually exercise  Depression screen [facility] 2/9 11/30/2015 09/16/2015 04/06/2015 02/03/2015  Decreased Interest 0 0 0 0  Down, Depressed, Hopeless 0 0 0 0  PHQ - 2 Score 0 0 0 0   Relevant past medical, surgical, family and social history reviewed Past Medical History  Diagnosis Date  . Arthritis   . Depression   . Hypertension   . Anxiety   . Hot flashes   . Breast cancer ([place]) 2016   Past Surgical History  Procedure Laterality Date  . Tonsillectomy and adenoidectomy    . Uvulectomy    . Cholecystectomy    . Hernia repair    . Cervical biopsy  w/ loop electrode excision    . Nasal septum surgery    . Mastectomy, partial Left  02/15/2015  . Breast lumpectomy with radioactive seed localization Left 03/18/2015    Procedure: BREAST LUMPECTOMY WITH RADIOACTIVE SEED LOCALIZATION;  Surgeon: Erroll Luna, MD;  Location: [facility];  Service: General;  Laterality: Left;   Family History  Problem Relation Age of Onset  . Adopted: Yes  . Family history unknown: Yes  genetic testing done: no BRCA  Social History  Substance Use Topics  . Smoking status: Never Smoker   . Smokeless tobacco: None  . Alcohol Use: No   Interim medical history since last visit reviewed. Allergies and medications reviewed  Review of Systems  Constitutional: Negative for fever, chills and unexpected weight change.  Respiratory: Negative for shortness of breath.   Cardiovascular: Negative for chest pain.  Gastrointestinal: Negative for blood in stool.  Genitourinary: Negative for dysuria and hematuria.  Skin: Negative for wound.  Hematological: Negative for adenopathy. Does not bruise/bleed easily.   Per HPI unless specifically indicated above     Objective:    BP 140/80 mmHg  Pulse 82  Temp(Src) 99 F (37.2 C) (Oral)  Resp 14  Wt 361 lb (163.749 kg)  SpO2 96%  Wt Readings from Last 3 Encounters:  11/30/15 361 lb (163.749 kg)  10/14/15  355 lb 14.4 oz (161.435 kg)  09/16/15 356 lb (161.481 kg)    Physical Exam  Constitutional: She appears well-developed and well-nourished. No distress.  HENT:  Head: Normocephalic and atraumatic.  Eyes: EOM are normal. No scleral icterus.  Neck: No JVD present. No thyromegaly present.  Cardiovascular: Normal rate, regular rhythm and normal heart sounds.   No murmur heard. Pulmonary/Chest: Effort normal and breath sounds normal. She has no wheezes.  Abdominal: Soft. Bowel sounds are normal. She exhibits no distension.  Musculoskeletal: Normal range of motion. She exhibits no edema.  Unable to elicit a Homan's sign on the left; no redness or swellling or edema of the legs    Neurological: She is alert. She exhibits normal muscle tone.  Skin: Skin is warm and dry. She is not diaphoretic. No pallor.  Psychiatric: She has a normal mood and affect. Her behavior is normal. Judgment and thought content normal.   Results for orders placed or performed during the hospital encounter of 04/29/15  CBC  Result Value Ref Range   WBC 7.9 3.6 - 11.0 K/uL   RBC 4.57 3.80 - 5.20 MIL/uL   Hemoglobin 12.1 12.0 - 16.0 g/dL   HCT 37.4 35.0 - 47.0 %   MCV 82.0 80.0 - 100.0 fL   MCH 26.5 26.0 - 34.0 pg   MCHC 32.3 32.0 - 36.0 g/dL   RDW 15.6 (H) 11.5 - 14.5 %   Platelets 212 150 - 440 K/uL      Assessment & Plan:   Problem List Items Addressed This Visit      Cardiovascular and Mediastinum   Hypertension goal BP (blood pressure) < 140/90    DASH guidelines; weight loss; increase amlodipine      Relevant Medications   amLODipine (NORVASC) 5 MG tablet     Endocrine   Elevated fasting glucose    Check a1c, work on weight loss      Relevant Orders   Hemoglobin A1c     Other   Obesity, morbid, BMI 50 or higher ([place])    Work on weight loss, see AVS; discussed bariatric surgery, will put in referral      Relevant Orders   TSH   Ambulatory referral to General Surgery   Lipid panel   Menorrhagia with irregular cycle    Cleared for surgery after labs completed      Hyperlipidemia LDL goal <100    Check lipids      Relevant Medications   amLODipine (NORVASC) 5 MG tablet    Other Visit Diagnoses    Pre-op exam    -  Primary    EKG done, large-breasted morbidly obese, so low voltage anteriorly; nonspec ST-T wave changes; will get labs; expect to clear her after results back    Relevant Orders    EKG 12-Lead    CBC with Differential/Platelet    Urinalysis w microscopic + reflex cultur    Comprehensive Metabolic Panel (CMET)    D-Dimer, Quantitative    PTT    INR/PT       Follow up plan: Return in about 1 month (around 12/31/2015) for follow-up.  An  after-visit summary was printed and given to the patient at [place].  Please see the patient instructions which may contain other information and recommendations beyond what is mentioned above in the assessment and plan.  Meds ordered this encounter  Medications  . amLODipine (NORVASC) 5 MG tablet    Sig: Take 1.5 tablets (7.5 mg total) by  mouth daily.    Dispense:  135 tablet    Refill:  3    New instructions, higher amount    Orders Placed This Encounter  Procedures  . CBC with Differential/Platelet  . Urinalysis w microscopic + reflex cultur  . Comprehensive Metabolic Panel (CMET)  . D-Dimer, Quantitative  . TSH  . Hemoglobin A1c  . Lipid panel  . PTT  . INR/PT  . Ambulatory referral to General Surgery  . EKG 12-Lead

## 2015-12-01 LAB — URINALYSIS W MICROSCOPIC + REFLEX CULTURE
Bacteria, UA: NONE SEEN [HPF]
Bilirubin Urine: NEGATIVE
CASTS: NONE SEEN [LPF]
CRYSTALS: NONE SEEN [HPF]
Glucose, UA: NEGATIVE
HGB URINE DIPSTICK: NEGATIVE
KETONES UR: NEGATIVE
Leukocytes, UA: NEGATIVE
Nitrite: NEGATIVE
Protein, ur: NEGATIVE
Specific Gravity, Urine: 1.02 (ref 1.001–1.035)
WBC, UA: NONE SEEN WBC/HPF (ref <=5)
Yeast: NONE SEEN [HPF]
pH: 5.5 (ref 5.0–8.0)

## 2015-12-01 LAB — HEMOGLOBIN A1C
HEMOGLOBIN A1C: 6.1 % — AB (ref <5.7)
Mean Plasma Glucose: 128 mg/dL

## 2015-12-01 NOTE — Telephone Encounter (Signed)
Patient went to the ER per chart

## 2015-12-01 NOTE — Discharge Instructions (Signed)
Pain Without a Known Cause °WHAT IS PAIN WITHOUT A KNOWN CAUSE? °Pain can occur in any part of the body and can range from mild to severe. Sometimes no cause can be found for why you are having pain. Some types of pain that can occur without a known cause include:  °· Headache. °· Back pain. °· Abdominal pain. °· Neck pain. °HOW IS PAIN WITHOUT A KNOWN CAUSE DIAGNOSED?  °Your health care provider will try to find the cause of your pain. This may include: °· Physical exam. °· Medical history. °· Blood tests. °· Urine tests. °· X-rays. °If no cause is found, your health care provider may diagnose you with pain without a known cause.  °IS THERE TREATMENT FOR PAIN WITHOUT A CAUSE?  °Treatment depends on the kind of pain you have. Your health care provider may prescribe medicines to help relieve your pain.  °WHAT CAN I DO AT HOME FOR MY PAIN?  °· Take medicines only as directed by your health care provider. °· Stop any activities that cause pain. During periods of severe pain, bed rest may help. °· Try to reduce your stress with activities such as yoga or meditation. Talk to your health care provider for other stress-reducing activity recommendations. °· Exercise regularly, if approved by your health care provider. °· Eat a healthy diet that includes fruits and vegetables. This may improve pain. Talk to your health care provider if you have any questions about your diet. °WHAT IF MY PAIN DOES NOT GET BETTER?  °If you have a painful condition and no reason can be found for the pain or the pain gets worse, it is important to follow up with your health care provider. It may be necessary to repeat tests and look further for a possible cause.  °  °This information is not intended to replace advice given to you by your health care provider. Make sure you discuss any questions you have with your health care provider. °  °Document Released: 01/24/2001 Document Revised: 05/22/2014 Document Reviewed: 09/16/2013 °Elsevier  Interactive Patient Education ©2016 [employer] ° °

## 2015-12-02 ENCOUNTER — Ambulatory Visit: Payer: Medicaid Other | Admitting: Oncology

## 2015-12-06 ENCOUNTER — Encounter: Payer: Self-pay | Admitting: Family Medicine

## 2015-12-10 ENCOUNTER — Telehealth: Payer: Self-pay | Admitting: Family Medicine

## 2015-12-10 NOTE — Telephone Encounter (Signed)
New referral entered

## 2015-12-10 NOTE — Assessment & Plan Note (Signed)
Refer to bariatric surg

## 2016-01-04 ENCOUNTER — Other Ambulatory Visit: Payer: Self-pay

## 2016-01-04 MED ORDER — PANTOPRAZOLE SODIUM 40 MG PO TBEC: 40.0000 mg | DELAYED_RELEASE_TABLET | Freq: Every day | ORAL | 0 refills | Status: DC | PRN

## 2016-01-04 NOTE — Telephone Encounter (Signed)
I would like to get patient to taper off of the PPI Please ask if she is willing to start tapering down I would recommend she skip one day a week for one week, then two days a week for one week, then three days a week, etc She can ADD Zantac 150 mg daily while she's on the PPI Long-term use of PPIs can cause serious problems Avoid triggers

## 2016-01-04 NOTE — Telephone Encounter (Signed)
Pt notified she will try

## 2016-01-12 ENCOUNTER — Telehealth: Payer: Self-pay | Admitting: Family Medicine

## 2016-01-12 NOTE — Telephone Encounter (Signed)
Is it ok to send notes? Did you approve for surgery?

## 2016-01-12 NOTE — Telephone Encounter (Signed)
Yes, I'll give her medical clearance

## 2016-01-13 NOTE — Telephone Encounter (Signed)
[place] notes faxed

## 2016-02-02 DIAGNOSIS — Z8541 Personal history of malignant neoplasm of cervix uteri: Secondary | ICD-10-CM | POA: Insufficient documentation

## 2016-02-02 DIAGNOSIS — D6859 Other primary thrombophilia: Secondary | ICD-10-CM | POA: Insufficient documentation

## 2016-02-04 ENCOUNTER — Other Ambulatory Visit: Payer: Self-pay | Admitting: Family Medicine

## 2016-02-04 ENCOUNTER — Other Ambulatory Visit: Payer: Self-pay | Admitting: Oncology

## 2016-02-04 NOTE — Telephone Encounter (Signed)
Please let pt know that we need to start weaned this drug down (pantoprazole) Long term use of this medicine has potential serious consequences I'll recommend that she decrease to every other day, and use Zantac 150 mg once or twice a day as needed to help control symptoms as she weans Avoid trigger foods and drinks, work on weight loss, avoid eating late in the evening, etc.

## 2016-02-04 NOTE — Telephone Encounter (Signed)
Left detailed voicemail

## 2016-02-05 ENCOUNTER — Other Ambulatory Visit: Payer: Self-pay | Admitting: Family Medicine

## 2016-02-08 NOTE — Telephone Encounter (Signed)
Request received for daily PPI; see last week's note; weaning down; I denied this Rx for daily use, now doing every other day and then wean off

## 2016-03-02 ENCOUNTER — Other Ambulatory Visit: Payer: Self-pay | Admitting: Family Medicine

## 2016-03-09 ENCOUNTER — Other Ambulatory Visit: Payer: Self-pay | Admitting: Oncology

## 2016-03-10 ENCOUNTER — Other Ambulatory Visit: Payer: Self-pay | Admitting: Family Medicine

## 2016-03-12 MED ORDER — RANITIDINE HCL 300 MG PO TABS: 300.0000 mg | ORAL_TABLET | Freq: Every day | ORAL | 5 refills | Status: DC

## 2016-03-12 NOTE — Telephone Encounter (Signed)
Pt is requesting PPI too soon; trying to get her off of this; add H2 blocker

## 2016-03-13 IMAGING — MG MM LT RADIOACTIVE SEED LOC MAMMO GUIDE
8 of 10 series · 8 of 10 positions shown · non-contrast
Comparison: Previous exam(s).

CLINICAL DATA: Ductal carcinoma in situ of the left breast.
Radioactive seed localization was requested prior to lumpectomy.

EXAM:
MAMMOGRAPHIC GUIDED RADIOACTIVE SEED LOCALIZATION OF THE LEFT BREAST

[L LM (1 of 4)]
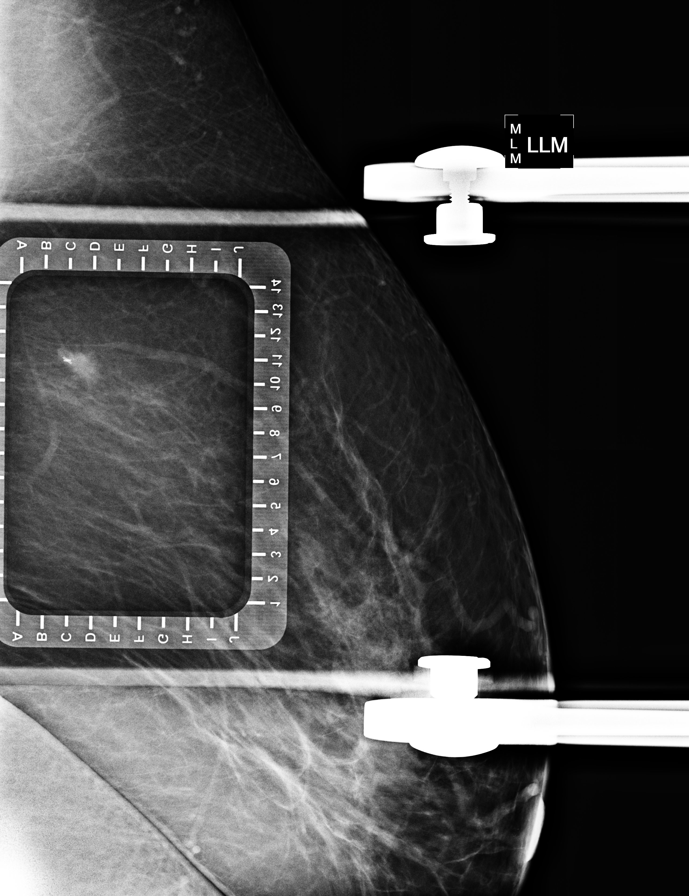

[L LM (2 of 4)]
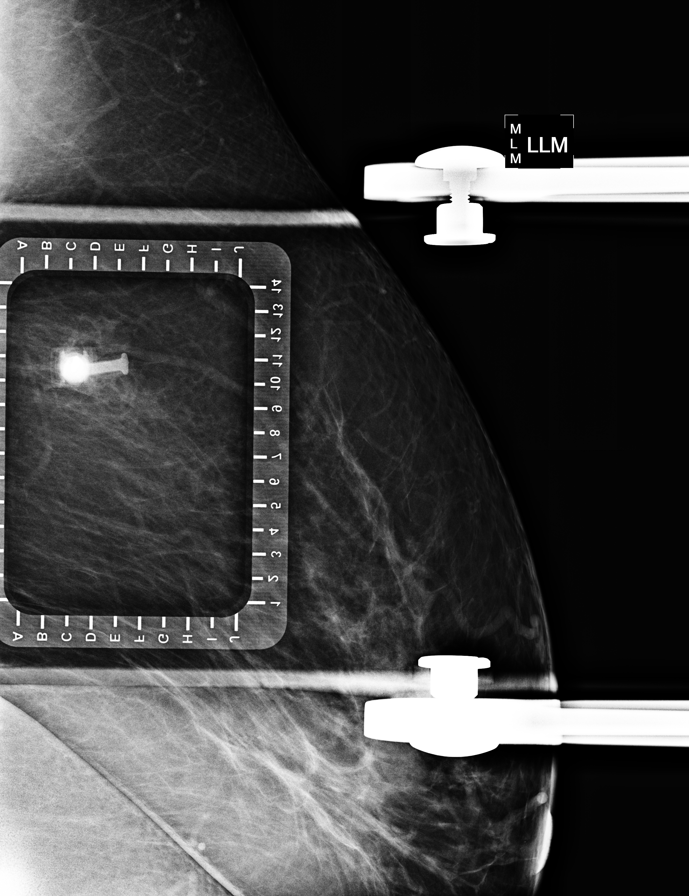

[L CC (1 of 4)]
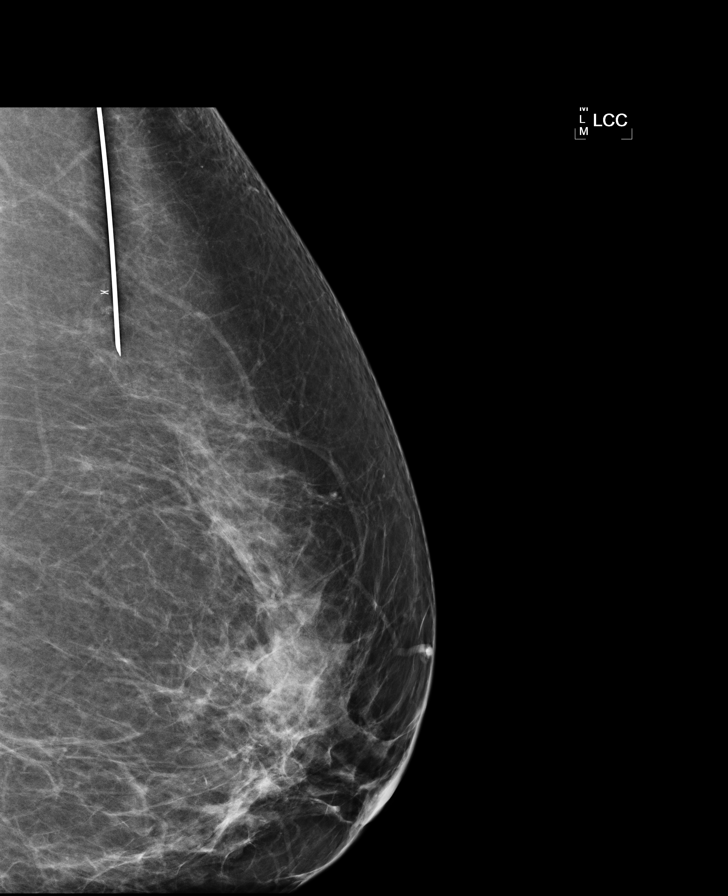

[L CC (2 of 4)]
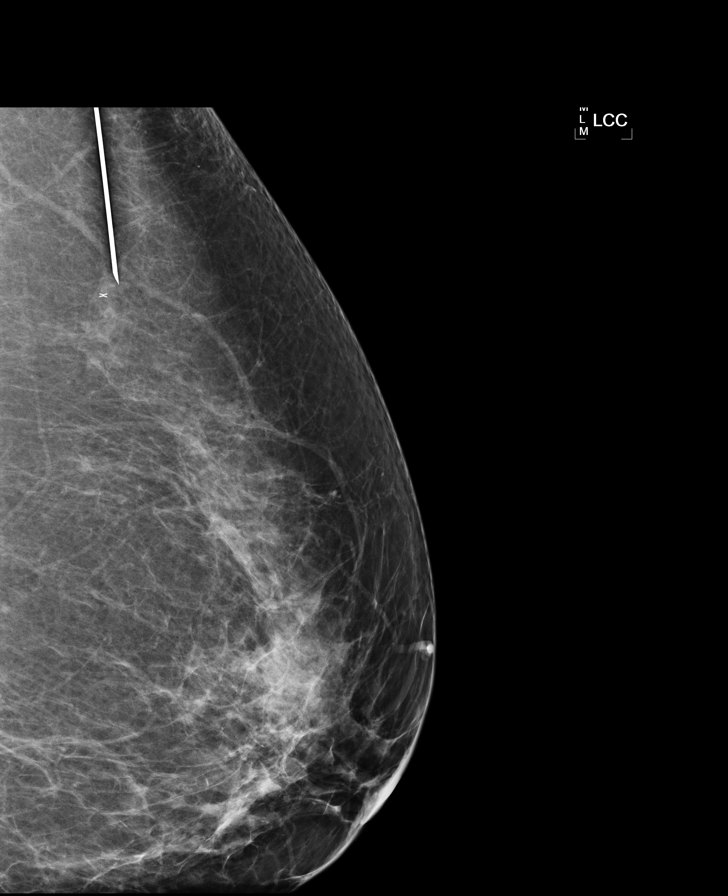

[L CC (3 of 4)]
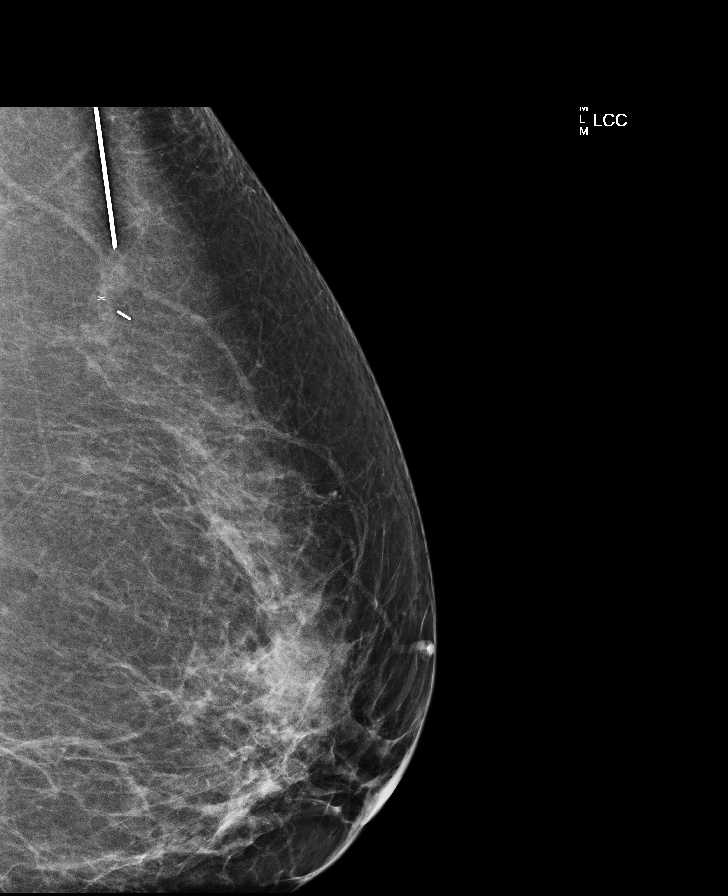

[L CC (4 of 4)]
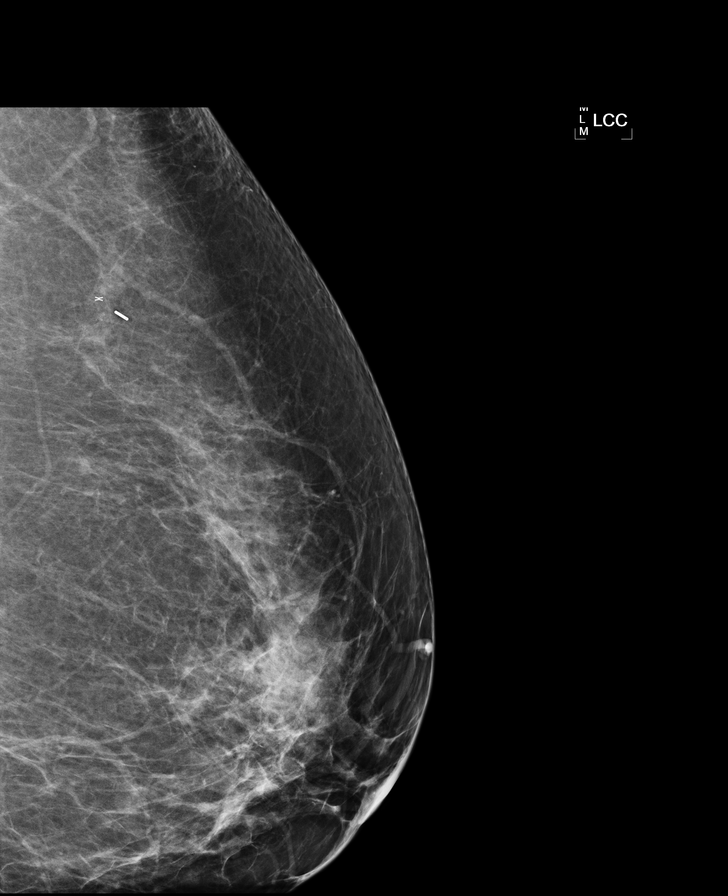

[L LM (3 of 4)]
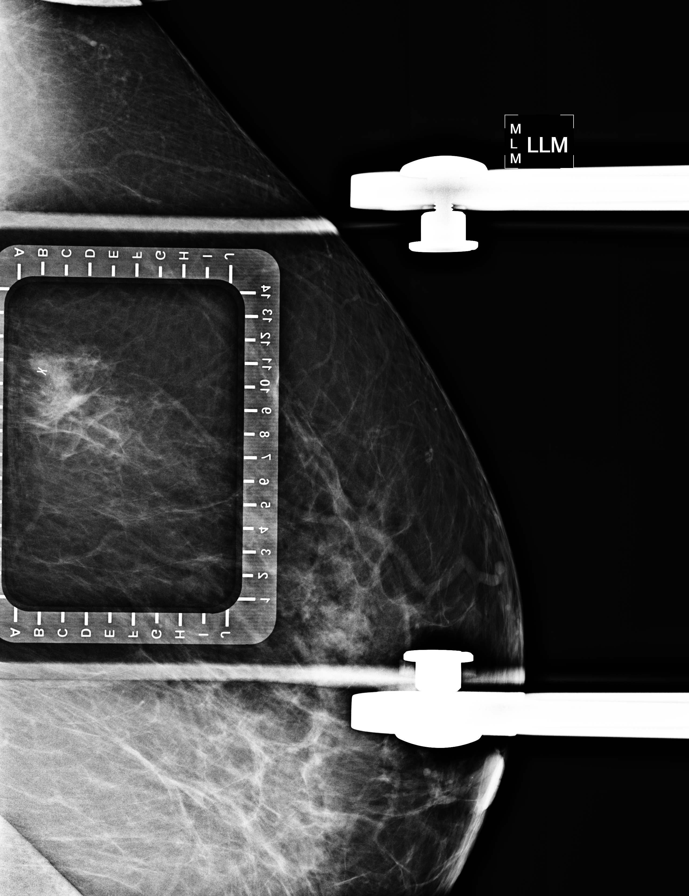

[L LM (4 of 4)]
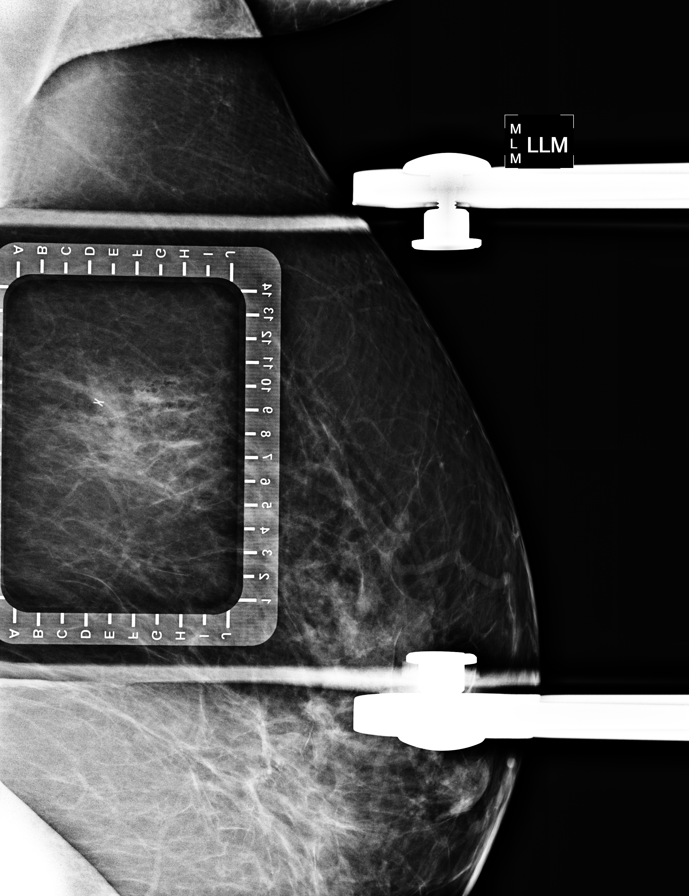

[8 of 10 positions shown; findings below may reference images not displayed]

FINDINGS: Patient presents for radioactive seed localization prior to
lumpectomy. I met with the patient and we discussed the procedure of
seed localization including benefits and alternatives. We discussed
the high likelihood of a successful procedure. We discussed the
risks of the procedure including infection, bleeding, tissue injury
and further surgery. We discussed the low dose of radioactivity
involved in the procedure. Informed, written consent was given.

The usual time-out protocol was performed immediately prior to the
procedure.

Using mammographic guidance, sterile technique, 2% lidocaine and an
1-Y0E radioactive seed, and X shaped biopsy clip and a several
residual faint calcifications were localized using a lateral to
medial approach. The follow-up mammogram images confirm the seed in
the expected location and were marked for Dr. Obryant.

Follow-up survey of the patient confirms presence of the radioactive
seed.

Order number of [id number] seed:  [id number].

Total activity: 0.254 millicuries Reference Date: 09 February, 2015

The patient tolerated the procedure well and was released from the
[REDACTED]. She was given instructions regarding seed removal.
IMPRESSION: Radioactive seed localization left breast. No apparent
complications.

## 2016-03-29 ENCOUNTER — Encounter: Payer: Self-pay | Admitting: Family Medicine

## 2016-03-29 ENCOUNTER — Ambulatory Visit (INDEPENDENT_AMBULATORY_CARE_PROVIDER_SITE_OTHER): Payer: Medicaid Other | Admitting: Family Medicine

## 2016-03-29 VITALS — BP 118/76 | HR 98 | Temp 99.1°F | Resp 18 | Ht 69.0 in | Wt 360.8 lb

## 2016-03-29 DIAGNOSIS — J0100 Acute maxillary sinusitis, unspecified: Secondary | ICD-10-CM

## 2016-03-29 DIAGNOSIS — R05 Cough: Secondary | ICD-10-CM | POA: Diagnosis not present

## 2016-03-29 DIAGNOSIS — R059 Cough, unspecified: Secondary | ICD-10-CM

## 2016-03-29 MED ORDER — AZITHROMYCIN 250 MG PO TABS: ORAL_TABLET | ORAL | 0 refills | Status: DC

## 2016-03-29 MED ORDER — BENZONATATE 200 MG PO CAPS
200.0000 mg | ORAL_CAPSULE | Freq: Three times a day (TID) | ORAL | 0 refills | Status: DC | PRN
Start: 2016-03-29 — End: 2016-11-03

## 2016-03-29 NOTE — Progress Notes (Signed)
Name: Elizabeth Hoffman   MRN: [id number]    DOB: October 15, 1970   Date:03/29/2016       Progress Note  Subjective  Chief Complaint  Chief Complaint  Patient presents with  . URI    cough, congested for 2 weeks  This patient is usually followed by Dr. Sanda Hoffman, new to me  Cough  This is a new problem. Episode onset: 10 days ago. The cough is productive of sputum. Associated symptoms include headaches and nasal congestion. Pertinent negatives include no chills, ear pain, fever, rhinorrhea or sore throat. She has tried OTC cough suppressant for the symptoms. The treatment provided no relief.     Past Medical History:  Diagnosis Date  . Anxiety   . Arthritis   . Breast cancer ([place]) 2016  . Breast cancer ([place])   . Depression   . Hot flashes   . Hypertension     Past Surgical History:  Procedure Laterality Date  . BREAST LUMPECTOMY WITH RADIOACTIVE SEED LOCALIZATION Left 03/18/2015   Procedure: BREAST LUMPECTOMY WITH RADIOACTIVE SEED LOCALIZATION;  Surgeon: Elizabeth Luna, MD;  Location: [facility];  Service: General;  Laterality: Left;  . CERVICAL BIOPSY  W/ LOOP ELECTRODE EXCISION    . CHOLECYSTECTOMY    . HERNIA REPAIR    . MASTECTOMY, PARTIAL Left 02/15/2015  . NASAL SEPTUM SURGERY    . TONSILLECTOMY AND ADENOIDECTOMY    . UVULECTOMY      Family History  Problem Relation Age of Onset  . Adopted: Yes  . Family history unknown: Yes    Social History   Social History  . Marital status: Married    Spouse name: N/A  . Number of children: 3  . Years of education: N/A   Occupational History  . Not on file.   Social History Main Topics  . Smoking status: Never Smoker  . Smokeless tobacco: Not on file  . Alcohol use No  . Drug use: No  . Sexual activity: Yes    Partners: Male    Birth control/ protection: IUD   Other Topics Concern  . Not on file   Social History Narrative  . No narrative on file     Current Outpatient Prescriptions:  .   amLODipine (NORVASC) 5 MG tablet, Take 1.5 tablets (7.5 mg total) by mouth daily. (Patient taking differently: Take 5 mg by mouth daily. ), Disp: 135 tablet, Rfl: 3 .  hydrochlorothiazide (HYDRODIURIL) 25 MG tablet, Take 1 tablet (25 mg total) by mouth daily., Disp: 90 tablet, Rfl: 3 .  pantoprazole (PROTONIX) 40 MG tablet, Take 1 tablet (40 mg total) by mouth every other day. Caution:prolonged use may increase risk of pneumonia, colitis, osteoporosis, anemia, Disp: 15 tablet, Rfl: 0 .  ranitidine (ZANTAC) 300 MG tablet, Take 1 tablet (300 mg total) by mouth at bedtime. For heartburn, reflux, Disp: 30 tablet, Rfl: 5 .  sertraline (ZOLOFT) 100 MG tablet, Take 1 tablet (100 mg total) by mouth daily., Disp: 90 tablet, Rfl: 3 .  tamoxifen (NOLVADEX) 20 MG tablet, TAKE 1 TABLET (20 MG TOTAL) BY MOUTH DAILY., Disp: 30 tablet, Rfl: 2  No Known Allergies   Review of Systems  Constitutional: Negative for chills and fever.  HENT: Negative for ear pain, rhinorrhea and sore throat.   Respiratory: Positive for cough.   Neurological: Positive for headaches.      Objective  Vitals:   03/29/16 1451  BP: 118/76  Pulse: 98  Resp: 18  Temp: 99.1 F (  37.3 C)  TempSrc: Oral  SpO2: 98%  Weight: (!) 360 lb 12.8 oz (163.7 kg)  Height: 5' 9"  (1.753 m)    Physical Exam  Constitutional: She is well-developed, well-nourished, and in no distress.  HENT:  Nose: Right sinus exhibits maxillary sinus tenderness. Left sinus exhibits maxillary sinus tenderness.  Mouth/Throat: No posterior oropharyngeal erythema.  Cardiovascular: Normal rate, regular rhythm and normal heart sounds.   No murmur heard. Pulmonary/Chest: Effort normal and breath sounds normal. No respiratory distress. She has no decreased breath sounds. She has no wheezes.  Nursing note and vitals reviewed.    Assessment & Plan  1. Acute non-recurrent maxillary sinusitis Started on antibiotics for acute sinusitis - azithromycin (ZITHROMAX)  250 MG tablet; 2 tabs day 1, then 1 tab po q day  x4 days  Dispense: 6 tablet; Refill: 0  2. Cough  - benzonatate (TESSALON) 200 MG capsule; Take 1 capsule (200 mg total) by mouth 3 (three) times daily as needed for cough.  Dispense: 20 capsule; Refill: 0   Elizabeth Hoffman [facility] 03/29/2016 2:55 PM

## 2016-04-03 ENCOUNTER — Telehealth: Payer: Self-pay | Admitting: Family Medicine

## 2016-04-03 DIAGNOSIS — J0100 Acute maxillary sinusitis, unspecified: Secondary | ICD-10-CM

## 2016-04-03 NOTE — Telephone Encounter (Signed)
Dr Sanda Klein patient saw you last week and states that you gave her a zpack. She states that it did not work for her that she is not any better. Asking that you please prescribe something different and asking that you please send it to [facility] in [place]

## 2016-04-04 MED ORDER — PREDNISONE 10 MG (21) PO TBPK: 10.0000 mg | ORAL_TABLET | Freq: Every day | ORAL | 0 refills | Status: DC

## 2016-04-04 NOTE — Telephone Encounter (Signed)
Patient reports that she has, persistent cough, pressure over her sinuses and that she is finished taking Z-Pak. Explained that sometimes cough lasts longer after the antibiotic course is completed and that Z-Pak keeps working for 5 more days after the last dosage. We will recommend starting on prednisone tapering dose from 60 mg for symptoms of sinusitis. Prescription will be called into her pharmacy. Explained that if she has any heartburn, reflux, or other symptoms after taking prednisone, she should contact this provider right away. Patient verbalized understanding.

## 2016-05-17 DIAGNOSIS — R0683 Snoring: Secondary | ICD-10-CM | POA: Diagnosis not present

## 2016-05-17 DIAGNOSIS — G4733 Obstructive sleep apnea (adult) (pediatric): Secondary | ICD-10-CM | POA: Diagnosis not present

## 2016-05-17 DIAGNOSIS — G4719 Other hypersomnia: Secondary | ICD-10-CM | POA: Diagnosis not present

## 2016-05-23 ENCOUNTER — Encounter: Payer: Self-pay | Admitting: Family Medicine

## 2016-06-07 ENCOUNTER — Telehealth: Payer: Self-pay

## 2016-06-07 ENCOUNTER — Other Ambulatory Visit: Payer: Self-pay | Admitting: Emergency Medicine

## 2016-06-07 DIAGNOSIS — C50412 Malignant neoplasm of upper-outer quadrant of left female breast: Secondary | ICD-10-CM

## 2016-06-07 NOTE — Telephone Encounter (Signed)
Patient called states needs order for diagnostic mammogram. Dx hx breast cancer

## 2016-06-07 NOTE — Telephone Encounter (Signed)
Patient notified

## 2016-06-07 NOTE — Telephone Encounter (Signed)
Please ask patient to contact Dr. Jana Hakim; I believe that is the doctor monitoring her after her cancer

## 2016-06-09 ENCOUNTER — Other Ambulatory Visit: Payer: Self-pay | Admitting: Emergency Medicine

## 2016-06-12 ENCOUNTER — Other Ambulatory Visit: Payer: Self-pay | Admitting: *Deleted

## 2016-06-12 ENCOUNTER — Other Ambulatory Visit: Payer: Self-pay | Admitting: Emergency Medicine

## 2016-06-12 DIAGNOSIS — C50412 Malignant neoplasm of upper-outer quadrant of left female breast: Secondary | ICD-10-CM

## 2016-06-12 DIAGNOSIS — Z1231 Encounter for screening mammogram for malignant neoplasm of breast: Secondary | ICD-10-CM

## 2016-06-15 ENCOUNTER — Telehealth: Payer: Self-pay

## 2016-06-15 ENCOUNTER — Other Ambulatory Visit: Payer: Self-pay | Admitting: Emergency Medicine

## 2016-06-15 ENCOUNTER — Other Ambulatory Visit: Payer: Self-pay | Admitting: Oncology

## 2016-06-15 DIAGNOSIS — Z853 Personal history of malignant neoplasm of breast: Secondary | ICD-10-CM

## 2016-06-15 NOTE — Telephone Encounter (Signed)
Pt called stating [facility]) waiting for signed mammogram orders. They faxed request already.

## 2016-06-21 ENCOUNTER — Ambulatory Visit
Admission: RE | Admit: 2016-06-21 | Discharge: 2016-06-21 | Disposition: A | Discharge status: Home / Self Care | Payer: Medicaid Other | Source: Ambulatory Visit | Attending: Oncology | Admitting: Oncology

## 2016-06-21 ENCOUNTER — Other Ambulatory Visit: Payer: Self-pay

## 2016-06-21 DIAGNOSIS — N644 Mastodynia: Secondary | ICD-10-CM | POA: Insufficient documentation

## 2016-06-21 DIAGNOSIS — Z853 Personal history of malignant neoplasm of breast: Secondary | ICD-10-CM | POA: Diagnosis not present

## 2016-06-30 ENCOUNTER — Other Ambulatory Visit: Payer: Medicaid Other

## 2016-06-30 ENCOUNTER — Ambulatory Visit: Payer: Medicaid Other

## 2016-07-12 DIAGNOSIS — G4733 Obstructive sleep apnea (adult) (pediatric): Secondary | ICD-10-CM | POA: Diagnosis not present

## 2016-07-13 DIAGNOSIS — G4733 Obstructive sleep apnea (adult) (pediatric): Secondary | ICD-10-CM | POA: Diagnosis not present

## 2016-07-31 ENCOUNTER — Other Ambulatory Visit: Payer: Self-pay | Admitting: Oncology

## 2016-08-13 DIAGNOSIS — G4733 Obstructive sleep apnea (adult) (pediatric): Secondary | ICD-10-CM | POA: Diagnosis not present

## 2016-09-12 DIAGNOSIS — G4733 Obstructive sleep apnea (adult) (pediatric): Secondary | ICD-10-CM | POA: Diagnosis not present

## 2016-10-03 ENCOUNTER — Other Ambulatory Visit: Payer: Self-pay

## 2016-10-03 MED ORDER — HYDROCHLOROTHIAZIDE 25 MG PO TABS: 25.0000 mg | ORAL_TABLET | Freq: Every day | ORAL | 0 refills | Status: DC

## 2016-10-03 NOTE — Telephone Encounter (Signed)
LMOM for pt to call and schedule an appt.

## 2016-10-03 NOTE — Telephone Encounter (Signed)
Patient's last visit with me was actually 10 months ago; please ask her to schedule an appt with me for fasting labs soon I'll send refill as requested Thank you

## 2016-10-07 ENCOUNTER — Other Ambulatory Visit: Payer: Self-pay | Admitting: Oncology

## 2016-10-13 DIAGNOSIS — G4733 Obstructive sleep apnea (adult) (pediatric): Secondary | ICD-10-CM | POA: Diagnosis not present

## 2016-10-16 ENCOUNTER — Ambulatory Visit: Payer: Medicaid Other | Admitting: Oncology

## 2016-11-03 ENCOUNTER — Encounter: Payer: Self-pay | Admitting: Family Medicine

## 2016-11-03 ENCOUNTER — Ambulatory Visit (INDEPENDENT_AMBULATORY_CARE_PROVIDER_SITE_OTHER): Payer: Medicaid Other | Admitting: Family Medicine

## 2016-11-03 VITALS — BP 122/74 | HR 96 | Temp 98.4°F | Resp 16 | Wt 371.6 lb

## 2016-11-03 DIAGNOSIS — C50412 Malignant neoplasm of upper-outer quadrant of left female breast: Secondary | ICD-10-CM | POA: Diagnosis not present

## 2016-11-03 DIAGNOSIS — R7303 Prediabetes: Secondary | ICD-10-CM

## 2016-11-03 DIAGNOSIS — F329 Major depressive disorder, single episode, unspecified: Secondary | ICD-10-CM

## 2016-11-03 DIAGNOSIS — E785 Hyperlipidemia, unspecified: Secondary | ICD-10-CM | POA: Diagnosis not present

## 2016-11-03 DIAGNOSIS — G4733 Obstructive sleep apnea (adult) (pediatric): Secondary | ICD-10-CM | POA: Diagnosis not present

## 2016-11-03 DIAGNOSIS — Z9889 Other specified postprocedural states: Secondary | ICD-10-CM

## 2016-11-03 DIAGNOSIS — B079 Viral wart, unspecified: Secondary | ICD-10-CM | POA: Diagnosis not present

## 2016-11-03 DIAGNOSIS — Z9989 Dependence on other enabling machines and devices: Secondary | ICD-10-CM | POA: Diagnosis not present

## 2016-11-03 DIAGNOSIS — F32A Depression, unspecified: Secondary | ICD-10-CM

## 2016-11-03 DIAGNOSIS — K219 Gastro-esophageal reflux disease without esophagitis: Secondary | ICD-10-CM

## 2016-11-03 HISTORY — DX: Obstructive sleep apnea (adult) (pediatric): G47.33

## 2016-11-03 MED ORDER — AMLODIPINE BESYLATE 5 MG PO TABS: 7.5000 mg | ORAL_TABLET | Freq: Every day | ORAL | 3 refills | Status: DC

## 2016-11-03 MED ORDER — SERTRALINE HCL 100 MG PO TABS: 100.0000 mg | ORAL_TABLET | Freq: Every day | ORAL | 3 refills | Status: DC

## 2016-11-03 NOTE — Assessment & Plan Note (Signed)
Will expect this to also improve with weight loss

## 2016-11-03 NOTE — Assessment & Plan Note (Signed)
Expecting surgery and weight loss will resolve this issue

## 2016-11-03 NOTE — Progress Notes (Signed)
BP 122/74   Pulse 96   Temp 98.4 F (36.9 C) (Oral)   Resp 16   Wt (!) 371 lb 9.6 oz (168.6 kg)   SpO2 93%   BMI 54.88 kg/m    Subjective:    Patient ID: Elizabeth Hoffman, female    DOB: 1970/09/24, 46 y.o.   MRN: [id number]  HPI: Elizabeth Hoffman is a 46 y.o. female  Chief Complaint  Patient presents with  . Medication Refill    HPI Patient is here for f/u She has morbid obesity; going to have Roux-en-Y She has done the bariatric surgery work-up; has gone to the [place] office, trips back and forth, done classes, psych evaluation, OSA and start CPAP GERD; no specific triggers; just avoids lemonade anyway, OJ anyway; using OTC omeprazole; has tried generic Zantac Sees Dr. Jana Hakim in [place]; full testing recently, negative; negative BRCA Discussed prior abnormal pap; went to [facility]; full pelvic exam at [facility], normal per patient Hx of depression; medicine working marvelous Has warts on the left foot; largest on the base of 2nd toe; tried everything  Depression screen [facility] 2/9 11/03/2016 11/30/2015 09/16/2015 04/06/2015 02/03/2015  Decreased Interest 0 0 0 0 0  Down, Depressed, Hopeless 0 0 0 0 0  PHQ - 2 Score 0 0 0 0 0    Relevant past medical, surgical, family and social history reviewed Past Medical History:  Diagnosis Date  . Anxiety   . Arthritis   . Breast cancer ([place]) 2016  . Breast cancer ([place])   . Depression   . Hot flashes   . Hypertension   . OSA on CPAP 11/03/2016   Past Surgical History:  Procedure Laterality Date  . BREAST EXCISIONAL BIOPSY Left 03/18/2015   breast ca +  . BREAST LUMPECTOMY WITH RADIOACTIVE SEED LOCALIZATION Left 03/18/2015   Procedure: BREAST LUMPECTOMY WITH RADIOACTIVE SEED LOCALIZATION;  Surgeon: Erroll Luna, MD;  Location: [facility];  Service: General;  Laterality: Left;  . CERVICAL BIOPSY  W/ LOOP ELECTRODE EXCISION    . CHOLECYSTECTOMY    . HERNIA REPAIR    . MASTECTOMY, PARTIAL Left 02/15/2015  . NASAL  SEPTUM SURGERY    . TONSILLECTOMY AND ADENOIDECTOMY    . UVULECTOMY     Family History  Problem Relation Age of Onset  . Adopted: Yes  . Family history unknown: Yes   Social History   Social History  . Marital status: Married    Spouse name: N/A  . Number of children: 3  . Years of education: N/A   Occupational History  . Not on file.   Social History Main Topics  . Smoking status: Never Smoker  . Smokeless tobacco: Never Used  . Alcohol use No  . Drug use: No  . Sexual activity: Yes    Partners: Male    Birth control/ protection: IUD   Other Topics Concern  . Not on file   Social History Narrative  . No narrative on file    Interim medical history since last visit reviewed. Allergies and medications reviewed  Review of Systems Per HPI unless specifically indicated above     Objective:    BP 122/74   Pulse 96   Temp 98.4 F (36.9 C) (Oral)   Resp 16   Wt (!) 371 lb 9.6 oz (168.6 kg)   SpO2 93%   BMI 54.88 kg/m   Wt Readings from Last 3 Encounters:  11/03/16 (!) 371 lb 9.6 oz (168.6 kg)  03/29/16 (!) 360 lb 12.8 oz (163.7 kg)  11/30/15 (!) 363 lb (164.7 kg)    Physical Exam  Constitutional: She appears well-developed and well-nourished. No distress.  Morbidly obese  HENT:  Head: Normocephalic and atraumatic.  Eyes: EOM are normal. No scleral icterus.  Neck: No JVD present. No thyromegaly present.  Cardiovascular: Normal rate, regular rhythm and normal heart sounds.   No murmur heard. Pulmonary/Chest: Effort normal and breath sounds normal. She has no wheezes.  Abdominal: Soft. She exhibits no distension.  Musculoskeletal: Normal range of motion. She exhibits no edema.  Neurological: She is alert. She exhibits normal muscle tone.  Skin: Skin is warm and dry. She is not diaphoretic. No pallor.  Verrucous papules left foot  Psychiatric: She has a normal mood and affect. Her behavior is normal. Judgment and thought content normal.        Assessment & Plan:   Problem List Items Addressed This Visit      Respiratory   OSA on CPAP    discovered part of bariatric surgery work-up        Digestive   GERD without esophagitis    Expect this to get better with weight loss      Relevant Medications   omeprazole (PRILOSEC) 20 MG capsule     Other   Pre-diabetes    Expecting surgery and weight loss will resolve this issue      Obesity, morbid, BMI 50 or higher ([place])    Planning on bariatric surgery      Hyperlipidemia LDL goal <100    Will expect this to also improve with weight loss      Relevant Medications   amLODipine (NORVASC) 5 MG tablet   History of loop electrosurgical excision procedure (LEEP) of cervix    Follows at [facility]; considering hysterectomy      Depressive disorder    Continue medicine      Relevant Medications   sertraline (ZOLOFT) 100 MG tablet   Breast cancer of upper-outer quadrant of left female breast ([place])    Monitored by Dr. Jana Hakim in [place]       Other Visit Diagnoses    Viral warts, unspecified type    -  Primary   Relevant Orders   Ambulatory referral to Dermatology       Follow up plan: Return in about 6 months (around 05/05/2017) for f/u or when bariatric surgeon says come to see [place].  An after-visit summary was printed and given to the patient at [place].  Please see the patient instructions which may contain other information and recommendations beyond what is mentioned above in the assessment and plan.  Meds ordered this encounter  Medications  . omeprazole (PRILOSEC) 20 MG capsule    Sig: Take 20 mg by mouth daily.  . sertraline (ZOLOFT) 100 MG tablet    Sig: Take 1 tablet (100 mg total) by mouth daily.    Dispense:  90 tablet    Refill:  3  . amLODipine (NORVASC) 5 MG tablet    Sig: Take 1.5 tablets (7.5 mg total) by mouth daily.    Dispense:  135 tablet    Refill:  3    New instructions, higher amount    Orders Placed This Encounter  Procedures   . Ambulatory referral to Dermatology

## 2016-11-03 NOTE — Assessment & Plan Note (Signed)
Follows at [facility]; considering hysterectomy

## 2016-11-03 NOTE — Assessment & Plan Note (Signed)
Monitored by Dr. Jana Hakim in [place]

## 2016-11-03 NOTE — Assessment & Plan Note (Signed)
Expect this to get better with weight loss

## 2016-11-03 NOTE — Assessment & Plan Note (Signed)
Continue medicine

## 2016-11-03 NOTE — Assessment & Plan Note (Signed)
Planning on bariatric surgery

## 2016-11-03 NOTE — Patient Instructions (Addendum)
Request labs from bariatric surgeon's office Check out the information at familydoctor.org entitled "Nutrition for Weight Loss: What You Need to Know about Fad Diets" Try to lose between 1-2 pounds per week by taking in fewer calories and burning off more calories You can succeed by limiting portions, limiting foods dense in calories and fat, becoming more active, and drinking 8 glasses of water a day (64 ounces) Don't skip meals, especially breakfast, as skipping meals may alter your metabolism Do not use over-the-counter weight loss pills or gimmicks that claim rapid weight loss A healthy BMI (or body mass index) is between 18.5 and 24.9 You can calculate your ideal BMI at the [place] website [employer]Monet[employer].fr As you lose weight, okay to decrease your amlodipine back down to 5 mg then stop if needed I've put in the referral to dermatologist If you have not heard anything from my staff in a week about any orders/referrals/studies from today, please contact us here to follow-up [phone]

## 2016-11-03 NOTE — Assessment & Plan Note (Signed)
discovered part of bariatric surgery work-up

## 2016-11-06 ENCOUNTER — Ambulatory Visit: Payer: Medicaid Other | Admitting: Oncology

## 2016-11-28 ENCOUNTER — Ambulatory Visit (INDEPENDENT_AMBULATORY_CARE_PROVIDER_SITE_OTHER): Payer: Medicaid Other | Admitting: Family Medicine

## 2016-11-28 ENCOUNTER — Ambulatory Visit: Payer: Medicaid Other | Admitting: Oncology

## 2016-11-28 ENCOUNTER — Encounter: Payer: Self-pay | Admitting: Family Medicine

## 2016-11-28 VITALS — BP 134/76 | HR 93 | Temp 98.2°F | Resp 16 | Wt 371.7 lb

## 2016-11-28 DIAGNOSIS — R350 Frequency of micturition: Secondary | ICD-10-CM | POA: Diagnosis not present

## 2016-11-28 DIAGNOSIS — R1030 Lower abdominal pain, unspecified: Secondary | ICD-10-CM | POA: Diagnosis not present

## 2016-11-28 LAB — POCT URINALYSIS DIPSTICK
BILIRUBIN UA: NEGATIVE
Blood, UA: NEGATIVE
Glucose, UA: NEGATIVE
Nitrite, UA: NEGATIVE
Protein, UA: NEGATIVE
Spec Grav, UA: 1.025 (ref 1.010–1.025)
Urobilinogen, UA: 0.2 E.U./dL
pH, UA: 5 (ref 5.0–8.0)

## 2016-11-28 MED ORDER — NITROFURANTOIN MONOHYD MACRO 100 MG PO CAPS: 100.0000 mg | ORAL_CAPSULE | Freq: Two times a day (BID) | ORAL | 0 refills | Status: AC

## 2016-11-28 NOTE — Patient Instructions (Addendum)
Try cutting out caffeinated drinks completely We'll culture the urine Try to drink enough water through the day to keep your urine very pale yellow or almost clear Start the new antibiotics Probiotic or kimchi or yogurt daily to help prevent yeast infections and diarrhea

## 2016-11-28 NOTE — Progress Notes (Signed)
BP 134/76   Pulse 93   Temp 98.2 F (36.8 C) (Oral)   Resp 16   Wt (!) 371 lb 11.2 oz (168.6 kg)   SpO2 93%   BMI 54.89 kg/m    Subjective:    Patient ID: Elizabeth Hoffman, female    DOB: [birth year], 46 y.o.   MRN: [id number]  HPI: Elizabeth Hoffman is a 46 y.o. female  Chief Complaint  Patient presents with  . Urinary Tract Infection    lower abdominal pain, frequent urination and urgency    HPI Patient is here for an acute visit She has been having lower abdominal pain, urinary frequency, urinary urgency This has been on a few weeks It's not like sneezing a peeing a little that bothers her; it's the urgency, like I got to go right now Happens every night when she gets up to go to the bathroom, she pees before she makes it to the bathroom She tried cutting back on caffeine; still drinks 2-3 glasses of caffeinated sodas a day No odor to the urine No fevers; does not feel like it's an infection Trace ketones, working on weight loss, but not rapid SG was high, so we talked about water intake  Depression screen [facility] 2/9 11/28/2016 11/03/2016 11/30/2015 09/16/2015 04/06/2015  Decreased Interest 0 0 0 0 0  Down, Depressed, Hopeless 0 0 0 0 0  PHQ - 2 Score 0 0 0 0 0    Relevant past medical, surgical, family and social history reviewed Past Medical History:  Diagnosis Date  . Anxiety   . Arthritis   . Breast cancer ([place]) 2016  . Breast cancer ([place])   . Depression   . Hot flashes   . Hypertension   . OSA on CPAP 11/03/2016   Family History  Problem Relation Age of Onset  . Adopted: Yes  . Family history unknown: Yes   Social History   Social History  . Marital status: Married    Spouse name: N/A  . Number of children: 3  . Years of education: N/A   Occupational History  . Not on file.   Social History Main Topics  . Smoking status: Never Smoker  . Smokeless tobacco: Never Used  . Alcohol use No  . Drug use: No  . Sexual activity: Yes    Partners: Male      Birth control/ protection: IUD   Other Topics Concern  . Not on file   Social History Narrative  . No narrative on file    Interim medical history since last visit reviewed. Allergies and medications reviewed  Review of Systems Per HPI unless specifically indicated above     Objective:    BP 134/76   Pulse 93   Temp 98.2 F (36.8 C) (Oral)   Resp 16   Wt (!) 371 lb 11.2 oz (168.6 kg)   SpO2 93%   BMI 54.89 kg/m   Wt Readings from Last 3 Encounters:  11/28/16 (!) 371 lb 11.2 oz (168.6 kg)  11/03/16 (!) 371 lb 9.6 oz (168.6 kg)  03/29/16 (!) 360 lb 12.8 oz (163.7 kg)    Physical Exam  Constitutional: She appears well-developed and well-nourished.  HENT:  Mouth/Throat: Mucous membranes are normal.  Eyes: EOM are normal. No scleral icterus.  Cardiovascular: Normal rate and regular rhythm.   Pulmonary/Chest: Effort normal and breath sounds normal.  Abdominal: Bowel sounds are normal. There is no tenderness. There is no guarding and no CVA tenderness.  Morbidly obese, large pannus  Skin: She is not diaphoretic.  Psychiatric: She has a normal mood and affect. Her behavior is normal.   Results for orders placed or performed in visit on 11/28/16  POCT urinalysis dipstick  Result Value Ref Range   Color, UA yellow    Clarity, UA cloudy    Glucose, UA neg    Bilirubin, UA neg    Ketones, UA trace    Spec Grav, UA 1.025 1.010 - 1.025   Blood, UA neg    pH, UA 5.0 5.0 - 8.0   Protein, UA neg    Urobilinogen, UA 0.2 0.2 or 1.0 E.U./dL   Nitrite, UA neg    Leukocytes, UA Small (1+) (A) Negative      Assessment & Plan:   Problem List Items Addressed This Visit    None    Visit Diagnoses    Frequent urination    -  Primary   taper completely off of the caffeine; urine today suggests UTI; culture and treat; if persistent, call and we'll refer to urologist   Relevant Orders   POCT urinalysis dipstick (Completed)   Urine Culture   Lower abdominal pain        likely UTI / cystitis; reviewed urine with her; will culture; start macrobid; call if symptoms not resolved   Relevant Orders   POCT urinalysis dipstick (Completed)   Urine Culture       Follow up plan: No Follow-up on file.  An after-visit summary was printed and given to the patient at [facility].  Please see the patient instructions which may contain other information and recommendations beyond what is mentioned above in the assessment and plan.  Meds ordered this encounter  Medications  . nitrofurantoin, macrocrystal-monohydrate, (MACROBID) 100 MG capsule    Sig: Take 1 capsule (100 mg total) by mouth 2 (two) times daily.    Dispense:  6 capsule    Refill:  0    Orders Placed This Encounter  Procedures  . Urine Culture  . POCT urinalysis dipstick

## 2016-11-30 LAB — URINE CULTURE

## 2016-12-06 ENCOUNTER — Other Ambulatory Visit: Payer: Self-pay

## 2016-12-06 DIAGNOSIS — R300 Dysuria: Secondary | ICD-10-CM

## 2016-12-07 ENCOUNTER — Telehealth: Payer: Self-pay | Admitting: Family Medicine

## 2016-12-07 ENCOUNTER — Encounter: Payer: Self-pay | Admitting: Family Medicine

## 2016-12-07 ENCOUNTER — Other Ambulatory Visit: Payer: Self-pay

## 2016-12-07 DIAGNOSIS — R399 Unspecified symptoms and signs involving the genitourinary system: Secondary | ICD-10-CM

## 2016-12-07 LAB — URINALYSIS W MICROSCOPIC + REFLEX CULTURE
BILIRUBIN URINE: NEGATIVE
Bacteria, UA: NONE SEEN [HPF]
CRYSTALS: NONE SEEN [HPF]
Casts: NONE SEEN [LPF]
Glucose, UA: NEGATIVE
HGB URINE DIPSTICK: NEGATIVE
KETONES UR: NEGATIVE
Leukocytes, UA: NEGATIVE
Nitrite: NEGATIVE
PROTEIN: NEGATIVE
SPECIFIC GRAVITY, URINE: 1.018 (ref 1.001–1.035)
Yeast: NONE SEEN [HPF]
pH: 5.5 (ref 5.0–8.0)

## 2016-12-07 MED ORDER — NITROFURANTOIN MONOHYD MACRO 100 MG PO CAPS: 100.0000 mg | ORAL_CAPSULE | Freq: Two times a day (BID) | ORAL | 0 refills | Status: AC

## 2016-12-07 NOTE — Telephone Encounter (Signed)
PT DROPPED OFF URINE 12-06-16 AND WANTS THE RESULTS

## 2016-12-07 NOTE — Telephone Encounter (Signed)
Patient notified will go ahead with referral I have already placed.

## 2016-12-07 NOTE — Telephone Encounter (Signed)
Thank you We messaged each other and decided that we would try one more round of antibiotics, and then send her to urologist if not better Thank you, though, and I appreciate the referral entry, but we'll cancel that for just now

## 2016-12-07 NOTE — Telephone Encounter (Signed)
Please cancel the urology referral Patient is going to try one more round of antibiotics and call us back Thank you

## 2016-12-07 NOTE — Telephone Encounter (Signed)
Done

## 2016-12-24 ENCOUNTER — Other Ambulatory Visit: Payer: Self-pay | Admitting: Oncology

## 2017-02-02 ENCOUNTER — Other Ambulatory Visit: Payer: Self-pay | Admitting: Oncology

## 2017-02-07 ENCOUNTER — Encounter: Payer: Self-pay | Admitting: Family Medicine

## 2017-02-07 DIAGNOSIS — Z170 Estrogen receptor positive status [ER+]: Principal | ICD-10-CM

## 2017-02-07 DIAGNOSIS — C50412 Malignant neoplasm of upper-outer quadrant of left female breast: Secondary | ICD-10-CM

## 2017-02-07 NOTE — Assessment & Plan Note (Signed)
Refer to [facility] oncologist per patient request

## 2017-02-09 ENCOUNTER — Other Ambulatory Visit: Payer: Self-pay | Admitting: Family Medicine

## 2017-02-12 ENCOUNTER — Other Ambulatory Visit: Payer: Self-pay

## 2017-02-12 DIAGNOSIS — R7303 Prediabetes: Secondary | ICD-10-CM

## 2017-02-12 DIAGNOSIS — Z5181 Encounter for therapeutic drug level monitoring: Secondary | ICD-10-CM

## 2017-02-12 DIAGNOSIS — I10 Essential (primary) hypertension: Secondary | ICD-10-CM

## 2017-02-12 NOTE — Telephone Encounter (Signed)
Pt will get her labs done this Friday ( oct 5th) in the morning. Mention to pt that they are fasting therefore she would have to come in fasting. Pt also schedule an apt with Dr.lada on Friday October 12th at 9:20, to go over the labs

## 2017-02-12 NOTE — Telephone Encounter (Signed)
I reviewed patient's labs I'd like to see her for a visit after some fasting labs Please order A1c (prediabetes), CMP (med monitoring), fasting lipids (prediabetes, HTN, morbid obesity) Then I'll see a few days after the labs to go over them Thank you I'll refill the med as requested, but we need to monitor her kidneys and electrolytes at least yearly, which is why I'm getting the labs

## 2017-02-15 ENCOUNTER — Telehealth: Payer: Self-pay | Admitting: Oncology

## 2017-02-15 ENCOUNTER — Inpatient Hospital Stay: Payer: Self-pay | Admitting: Oncology

## 2017-02-15 NOTE — Telephone Encounter (Signed)
Est patient changed to New pt visit, per [facility]. (verbal) Patient also given a New patient packet to complete, as per patient stated that she only received a phone call with appt info and haven't received it in the mail.

## 2017-02-15 NOTE — Progress Notes (Deleted)
Hematology/Oncology Consult note [facility] Telephone:([phone] [phone]  Patient Care Team: Arnetha Courser, MD as PCP - General (Family Medicine) Erroll Luna, MD as Consulting Physician (General Surgery) Magrinat, Virgie Dad, MD as Consulting Physician (Oncology) Gery Pray, MD as Consulting Physician (Radiation Oncology) Rockwell Germany, RN as Registered Nurse Mauro Kaufmann, RN as Registered Nurse Will Bonnet, MD as Attending Physician (Obstetrics and Gynecology) Sylvan Cheese, NP as Nurse Practitioner (Hematology and Oncology)   Name of the patient: Elizabeth Hoffman  [id number]  04/29/1971    Reason for referral- h/o breast cancer   Referring physician- Dr. Rip Harbour lada  Date of visit: 02/15/17   History of presenting illness- ***  ECOG PS- ***  Pain scale- ***   Review of systems- Review of Systems  Constitutional: Negative for chills, fever, malaise/fatigue and weight loss.  HENT: Negative for congestion, ear discharge and nosebleeds.   Eyes: Negative for blurred vision.  Respiratory: Negative for cough, hemoptysis, sputum production, shortness of breath and wheezing.   Cardiovascular: Negative for chest pain, palpitations, orthopnea and claudication.  Gastrointestinal: Negative for abdominal pain, blood in stool, constipation, diarrhea, heartburn, melena, nausea and vomiting.  Genitourinary: Negative for dysuria, flank pain, frequency, hematuria and urgency.  Musculoskeletal: Negative for back pain, joint pain and myalgias.  Skin: Negative for rash.  Neurological: Negative for dizziness, tingling, focal weakness, seizures, weakness and headaches.  Endo/Heme/Allergies: Does not bruise/bleed easily.  Psychiatric/Behavioral: Negative for depression and suicidal ideas. The patient does not have insomnia.     No Known Allergies  Patient Active Problem List   Diagnosis Date Noted  . OSA on CPAP  11/03/2016  . Obesity, morbid, BMI 50 or higher ([place]) 07/05/2015  . Malignant neoplasm of upper-outer quadrant of left breast in female, estrogen receptor positive ([place]) 02/22/2015  . Hyperlipidemia LDL goal <100 02/08/2015  . GERD without esophagitis 02/03/2015  . Menorrhagia with irregular cycle 02/03/2015  . Depressive disorder 02/03/2015  . Hypertension goal BP (blood pressure) < 140/90 02/03/2015  . De Quervain's tenosynovitis, bilateral 02/03/2015  . Numerous moles 02/03/2015  . Pre-diabetes 02/03/2015  . History of loop electrosurgical excision procedure (LEEP) of cervix 02/03/2015  . Encounter for screening mammogram for malignant neoplasm of breast 02/03/2015  . Elevated fasting glucose 02/03/2015  . Encounter for cholesteral screening for cardiovascular disease 02/03/2015     Past Medical History:  Diagnosis Date  . Anxiety   . Arthritis   . Breast cancer ([place]) 2016  . Breast cancer ([place])   . Depression   . Hot flashes   . Hypertension   . OSA on CPAP 11/03/2016     Past Surgical History:  Procedure Laterality Date  . BREAST EXCISIONAL BIOPSY Left 03/18/2015   breast ca +  . BREAST LUMPECTOMY WITH RADIOACTIVE SEED LOCALIZATION Left 03/18/2015   Procedure: BREAST LUMPECTOMY WITH RADIOACTIVE SEED LOCALIZATION;  Surgeon: Erroll Luna, MD;  Location: [facility];  Service: General;  Laterality: Left;  . CERVICAL BIOPSY  W/ LOOP ELECTRODE EXCISION    . CHOLECYSTECTOMY    . HERNIA REPAIR    . MASTECTOMY, PARTIAL Left 02/15/2015  . NASAL SEPTUM SURGERY    . TONSILLECTOMY AND ADENOIDECTOMY    . UVULECTOMY      Social History   Social History  . Marital status: Married    Spouse name: N/A  . Number of children: 3  . Years of education: N/A   Occupational History  . Not  on file.   Social History Main Topics  . Smoking status: Never Smoker  . Smokeless tobacco: Never Used  . Alcohol use No  . Drug use: No  . Sexual activity: Yes    Partners:  Male    Birth control/ protection: IUD   Other Topics Concern  . Not on file   Social History Narrative  . No narrative on file     Family History  Problem Relation Age of Onset  . Adopted: Yes  . Family history unknown: Yes     Current Outpatient Prescriptions:  .  amLODipine (NORVASC) 5 MG tablet, Take 1.5 tablets (7.5 mg total) by mouth daily., Disp: 135 tablet, Rfl: 3 .  hydrochlorothiazide (HYDRODIURIL) 25 MG tablet, TAKE 1 TABLET BY MOUTH DAILY., Disp: 90 tablet, Rfl: 0 .  omeprazole (PRILOSEC) 20 MG capsule, Take 20 mg by mouth daily., Disp: , Rfl:  .  sertraline (ZOLOFT) 100 MG tablet, Take 1 tablet (100 mg total) by mouth daily., Disp: 90 tablet, Rfl: 3 .  tamoxifen (NOLVADEX) 20 MG tablet, TAKE ONE TABLET BY MOUTH DAILY, Disp: 30 tablet, Rfl: 0   Physical exam: There were no vitals filed for this visit. Physical Exam  Constitutional: She is oriented to person, place, and time and well-developed, well-nourished, and in no distress.  HENT:  Head: Normocephalic and atraumatic.  Eyes: Pupils are equal, round, and reactive to light. EOM are normal.  Neck: Normal range of motion.  Cardiovascular: Normal rate, regular rhythm and normal heart sounds.   Pulmonary/Chest: Effort normal and breath sounds normal.  Abdominal: Soft. Bowel sounds are normal.  Neurological: She is alert and oriented to person, place, and time.  Skin: Skin is warm and dry.       CMP Latest Ref Rng & Units 11/30/2015  Glucose 65 - 99 mg/dL 98  BUN 7 - 25 mg/dL 16  Creatinine 0.50 - 1.10 mg/dL 1.04  Sodium 135 - 146 mmol/L 139  Potassium 3.5 - 5.3 mmol/L 4.2  Chloride 98 - 110 mmol/L 105  CO2 20 - 31 mmol/L 27  Calcium 8.6 - 10.2 mg/dL 9.0  Total Protein 6.1 - 8.1 g/dL 6.8  Total Bilirubin 0.2 - 1.2 mg/dL 0.3  Alkaline Phos 33 - 115 U/L 53  AST 10 - 30 U/L 24  ALT 6 - 29 U/L 24   CBC Latest Ref Rng & Units 11/30/2015  WBC 3.8 - 10.8 K/uL 6.8  Hemoglobin 11.7 - 15.5 g/dL 12.3  Hematocrit  35.0 - 45.0 % 38.1  Platelets 140 - 400 K/uL 236     Assessment and plan- Patient is a 46 y.o. female ***   Thank you for this kind referral and the opportunity to participate in the care of this  Patient   Visit Diagnosis No diagnosis found.  Dr. Randa Evens, MD, MPH [place] [facility] Pager- [phone] 02/15/2017

## 2017-02-17 LAB — LIPID PANEL
CHOL/HDL RATIO: 3 (calc) (ref <5.0)
CHOLESTEROL: 160 mg/dL (ref <200)
HDL: 54 mg/dL (ref >50)
LDL Cholesterol (Calc): 81 mg/dL (calc)
Non-HDL Cholesterol (Calc): 106 mg/dL (calc) (ref <130)
Triglycerides: 151 mg/dL — ABNORMAL HIGH (ref <150)

## 2017-02-17 LAB — COMPLETE METABOLIC PANEL WITH GFR
AG Ratio: 1.3 (calc) (ref 1.0–2.5)
ALKALINE PHOSPHATASE (APISO): 50 U/L (ref 33–115)
ALT: 21 U/L (ref 6–29)
AST: 21 U/L (ref 10–35)
Albumin: 3.7 g/dL (ref 3.6–5.1)
BUN: 17 mg/dL (ref 7–25)
CHLORIDE: 105 mmol/L (ref 98–110)
CO2: 26 mmol/L (ref 20–32)
Calcium: 9.2 mg/dL (ref 8.6–10.2)
Creat: 0.98 mg/dL (ref 0.50–1.10)
GFR, Est African American: 81 mL/min/{1.73_m2} (ref >=60)
GFR, Est Non African American: 70 mL/min/{1.73_m2} (ref >=60)
GLOBULIN: 2.9 g/dL (ref 1.9–3.7)
GLUCOSE: 98 mg/dL (ref 65–99)
Potassium: 4.3 mmol/L (ref 3.5–5.3)
SODIUM: 140 mmol/L (ref 135–146)
Total Bilirubin: 0.4 mg/dL (ref 0.2–1.2)
Total Protein: 6.6 g/dL (ref 6.1–8.1)

## 2017-02-17 LAB — HEMOGLOBIN A1C
EAG (MMOL/L): 7.1 (calc)
HEMOGLOBIN A1C: 6.1 %{Hb} — AB (ref <5.7)
Mean Plasma Glucose: 128 (calc)

## 2017-02-20 ENCOUNTER — Inpatient Hospital Stay: Payer: Medicaid Other | Attending: Oncology | Admitting: Oncology

## 2017-02-20 ENCOUNTER — Encounter: Payer: Self-pay | Admitting: Oncology

## 2017-02-20 ENCOUNTER — Inpatient Hospital Stay: Payer: Medicaid Other

## 2017-02-20 VITALS — BP 124/76 | HR 76 | Temp 98.5°F | Resp 14 | Ht 69.0 in | Wt 377.0 lb

## 2017-02-20 DIAGNOSIS — Z7982 Long term (current) use of aspirin: Secondary | ICD-10-CM | POA: Diagnosis not present

## 2017-02-20 DIAGNOSIS — E785 Hyperlipidemia, unspecified: Secondary | ICD-10-CM | POA: Diagnosis not present

## 2017-02-20 DIAGNOSIS — Z79899 Other long term (current) drug therapy: Secondary | ICD-10-CM | POA: Insufficient documentation

## 2017-02-20 DIAGNOSIS — N920 Excessive and frequent menstruation with regular cycle: Secondary | ICD-10-CM | POA: Diagnosis not present

## 2017-02-20 DIAGNOSIS — C50412 Malignant neoplasm of upper-outer quadrant of left female breast: Secondary | ICD-10-CM

## 2017-02-20 DIAGNOSIS — M129 Arthropathy, unspecified: Secondary | ICD-10-CM

## 2017-02-20 DIAGNOSIS — Z23 Encounter for immunization: Secondary | ICD-10-CM

## 2017-02-20 DIAGNOSIS — Z170 Estrogen receptor positive status [ER+]: Secondary | ICD-10-CM | POA: Insufficient documentation

## 2017-02-20 DIAGNOSIS — I10 Essential (primary) hypertension: Secondary | ICD-10-CM | POA: Diagnosis not present

## 2017-02-20 DIAGNOSIS — Z5181 Encounter for therapeutic drug level monitoring: Secondary | ICD-10-CM

## 2017-02-20 DIAGNOSIS — M654 Radial styloid tenosynovitis [de Quervain]: Secondary | ICD-10-CM | POA: Diagnosis not present

## 2017-02-20 DIAGNOSIS — E669 Obesity, unspecified: Secondary | ICD-10-CM | POA: Diagnosis not present

## 2017-02-20 DIAGNOSIS — K219 Gastro-esophageal reflux disease without esophagitis: Secondary | ICD-10-CM | POA: Diagnosis not present

## 2017-02-20 DIAGNOSIS — D0512 Intraductal carcinoma in situ of left breast: Secondary | ICD-10-CM | POA: Insufficient documentation

## 2017-02-20 DIAGNOSIS — Z79810 Long term (current) use of selective estrogen receptor modulators (SERMs): Secondary | ICD-10-CM

## 2017-02-20 DIAGNOSIS — F329 Major depressive disorder, single episode, unspecified: Secondary | ICD-10-CM | POA: Insufficient documentation

## 2017-02-20 DIAGNOSIS — F419 Anxiety disorder, unspecified: Secondary | ICD-10-CM | POA: Diagnosis not present

## 2017-02-20 DIAGNOSIS — G4730 Sleep apnea, unspecified: Secondary | ICD-10-CM

## 2017-02-20 MED ORDER — INFLUENZA VAC SPLIT QUAD 0.5 ML IM SUSY
0.5000 mL | PREFILLED_SYRINGE | Freq: Once | INTRAMUSCULAR | Status: AC
  Administered 2017-02-20: 0.5 mL via INTRAMUSCULAR
  Filled 2017-02-20: qty 0.5

## 2017-02-20 MED ORDER — TAMOXIFEN CITRATE 20 MG PO TABS: 20.0000 mg | ORAL_TABLET | Freq: Every day | ORAL | 3 refills | Status: DC

## 2017-02-20 NOTE — Progress Notes (Signed)
Hematology/Oncology Consult note [facility] Telephone:([phone] [phone]  Patient Care Team: Arnetha Courser, MD as PCP - General (Family Medicine) Erroll Luna, MD as Consulting Physician (General Surgery) Magrinat, Virgie Dad, MD as Consulting Physician (Oncology) Gery Pray, MD as Consulting Physician (Radiation Oncology) Rockwell Germany, RN as Registered Nurse Mauro Kaufmann, RN as Registered Nurse Will Bonnet, MD as Attending Physician (Obstetrics and Gynecology) Sylvan Cheese, NP as Nurse Practitioner (Hematology and Oncology)   Name of the patient: Elizabeth Hoffman  [id number]  10-22-1970    Reason for referral- h/o left breast DCIS   Referring physician- Dr. 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 She underwent lumpectomy and completed adjuvant radiation in January 2017. She has been on tamoxifen since February 2017. Patient  has a Mirena IUD in place. Reports that her last menstrual cycle was several months ago. She was contemplating hysterectomy at some point but not considering that at the moment  Patient also underwent genetic testing and was not found to have any genetic mutations.recent mammogram and USG from feb 2018 revealed no malignancy. Currently she is on tamoxifen and tolerating it well except for some hot flashes which are self limited  ECOG PS- 0  Pain scale- 0   Review of systems- Review of Systems  Constitutional: Negative for chills, fever, malaise/fatigue and weight loss.  HENT: Negative for congestion, ear discharge and nosebleeds.   Eyes: Negative for blurred vision.  Respiratory: Negative for cough, hemoptysis, sputum production, shortness of breath and wheezing.   Cardiovascular: Negative for chest pain, palpitations, orthopnea and  claudication.  Gastrointestinal: Negative for abdominal pain, blood in stool, constipation, diarrhea, heartburn, melena, nausea and vomiting.  Genitourinary: Negative for dysuria, flank pain, frequency, hematuria and urgency.  Musculoskeletal: Negative for back pain, joint pain and myalgias.  Skin: Negative for rash.  Neurological: Negative for dizziness, tingling, focal weakness, seizures, weakness and headaches.  Endo/Heme/Allergies: Does not bruise/bleed easily.  Psychiatric/Behavioral: Negative for depression and suicidal ideas. The patient does not have insomnia.     No Known Allergies  Patient Active Problem List   Diagnosis Date Noted  . OSA on CPAP 11/03/2016  . Obesity, morbid, BMI 50 or higher ([place]) 07/05/2015  . Malignant neoplasm of upper-outer quadrant of left breast in female, estrogen receptor positive ([place]) 02/22/2015  . Hyperlipidemia LDL goal <100 02/08/2015  . GERD without esophagitis 02/03/2015  . Menorrhagia with irregular cycle 02/03/2015  . Depressive disorder 02/03/2015  . Hypertension goal BP (blood pressure) < 140/90 02/03/2015  . De Quervain's tenosynovitis, bilateral 02/03/2015  . Numerous moles 02/03/2015  . Pre-diabetes 02/03/2015  . History of loop electrosurgical excision procedure (LEEP) of cervix 02/03/2015  . Encounter for screening mammogram for malignant neoplasm of breast 02/03/2015  . Elevated fasting glucose 02/03/2015  . Encounter for cholesteral screening for cardiovascular disease 02/03/2015     Past Medical History:  Diagnosis Date  . Anxiety   . Arthritis   . Breast cancer ([place]) 2016  . Breast cancer ([place])   . Depression   . Hot flashes   . Hypertension   . OSA on CPAP 11/03/2016     Past Surgical History:  Procedure Laterality Date  . BREAST EXCISIONAL BIOPSY Left 03/18/2015   breast ca +  . BREAST LUMPECTOMY WITH RADIOACTIVE SEED LOCALIZATION Left 03/18/2015  Procedure: BREAST LUMPECTOMY WITH RADIOACTIVE SEED  LOCALIZATION;  Surgeon: Erroll Luna, MD;  Location: [facility];  Service: General;  Laterality: Left;  . CERVICAL BIOPSY  W/ LOOP ELECTRODE EXCISION    . CHOLECYSTECTOMY    . HERNIA REPAIR    . MASTECTOMY, PARTIAL Left 02/15/2015  . NASAL SEPTUM SURGERY    . TONSILLECTOMY AND ADENOIDECTOMY    . UVULECTOMY      Social History   Social History  . Marital status: Married    Spouse name: N/A  . Number of children: 3  . Years of education: N/A   Occupational History  . Not on file.   Social History Main Topics  . Smoking status: Never Smoker  . Smokeless tobacco: Never Used  . Alcohol use No  . Drug use: No  . Sexual activity: Yes    Partners: Male    Birth control/ protection: IUD   Other Topics Concern  . Not on file   Social History Narrative  . No narrative on file     Family History  Problem Relation Age of Onset  . Adopted: Yes  . Family history unknown: Yes     Current Outpatient Prescriptions:  .  amLODipine (NORVASC) 5 MG tablet, Take 1.5 tablets (7.5 mg total) by mouth daily., Disp: 135 tablet, Rfl: 3 .  hydrochlorothiazide (HYDRODIURIL) 25 MG tablet, TAKE 1 TABLET BY MOUTH DAILY., Disp: 90 tablet, Rfl: 0 .  omeprazole (PRILOSEC) 20 MG capsule, Take 20 mg by mouth daily., Disp: , Rfl:  .  sertraline (ZOLOFT) 100 MG tablet, Take 1 tablet (100 mg total) by mouth daily., Disp: 90 tablet, Rfl: 3 .  tamoxifen (NOLVADEX) 20 MG tablet, Take 1 tablet (20 mg total) by mouth daily., Disp: 90 tablet, Rfl: 3   Physical exam:  Vitals:   02/20/17 1450  BP: 124/76  Pulse: 76  Resp: 14  Temp: 98.5 F (36.9 C)  TempSrc: Tympanic  Weight: (!) 377 lb (171 kg)  Height: 5' 9"  (1.753 m)   Physical Exam  Constitutional: She is oriented to person, place, and time.  Obese. Appears in no acute distress  HENT:  Head: Normocephalic and atraumatic.  Eyes: Pupils are equal, round, and reactive to light. EOM are normal.  Neck: Normal range of motion.    Cardiovascular: Normal rate, regular rhythm and normal heart sounds.   Pulmonary/Chest: Effort normal and breath sounds normal.  Abdominal: Soft. Bowel sounds are normal.  Neurological: She is alert and oriented to person, place, and time.  Skin: Skin is warm and dry.   Breast exam was performed in seated and lying down position. Patient is status post left lumpectomy with a well-healed surgical scar. No evidence of any palpable masses. No evidence of axillary adenopathy. No evidence of any palpable masses or lumps in the right breast. No evidence of right axillary adenopathy    CMP Latest Ref Rng & Units 02/16/2017  Glucose 65 - 99 mg/dL 98  BUN 7 - 25 mg/dL 17  Creatinine 0.50 - 1.10 mg/dL 0.98  Sodium 135 - 146 mmol/L 140  Potassium 3.5 - 5.3 mmol/L 4.3  Chloride 98 - 110 mmol/L 105  CO2 20 - 32 mmol/L 26  Calcium 8.6 - 10.2 mg/dL 9.2  Total Protein 6.1 - 8.1 g/dL 6.6  Total Bilirubin 0.2 - 1.2 mg/dL 0.4  Alkaline Phos 33 - 115 U/L -  AST 10 - 35 U/L 21  ALT 6 - 29 U/L 21  CBC Latest Ref Rng & Units 11/30/2015  WBC 3.8 - 10.8 K/uL 6.8  Hemoglobin 11.7 - 15.5 g/dL 12.3  Hematocrit 35.0 - 45.0 % 38.1  Platelets 140 - 400 K/uL 236     Assessment and plan- Patient is a 46 y.o. female with left breast DCIS ER + currently on tamoxifen  No evidence of recurrence on todays exam. Patient is tolerating tamoxifen well and will continue it for 5 years until 2022. She does report hot flashes that are self limited and she does not desire any medications for the same presently.  I discussed mirena use in the face of Er + DCIS. One retrospective study showed increased incidence of breast cancer in women with prior h/o breast cancer and hormonal IUD use although it was not statistically significant. We do not have high level data to support this but in general any form of hormone exposure is discourgaed in women with breast cancer. Although mirena is helping her with menorrhagia, systemic  absorption is unpredictable and exists even if little. I would therefore recommend switching to copper IUD at this time. She needs to discuss this further with GYN. She needs to f/u with them on a yearly basis  She also needs yearly eye exams given increased risk of cataracts with tamoxifen  I will see her back in 6 months. Tamoxifen has been refilled. Mammogram scheduled for feb 2019   Thank you for this kind referral and the opportunity to participate in the care of this patient   Visit Diagnosis 1. Malignant neoplasm of upper-outer quadrant of left breast in female, estrogen receptor positive ([place])   2. Encounter for monitoring tamoxifen therapy     Dr. Randa Evens, MD, MPH [facility] at [facility] Pager- [phone] 02/20/2017

## 2017-02-20 NOTE — Progress Notes (Signed)
Patient here to establish care and get refill on her Tamoxifen. She denies having any pain. She would like to get the flu vaccine today.

## 2017-02-21 ENCOUNTER — Encounter: Payer: Self-pay | Admitting: Oncology

## 2017-02-22 ENCOUNTER — Encounter: Payer: Self-pay | Admitting: Family Medicine

## 2017-02-22 DIAGNOSIS — H2633 Drug-induced cataract, bilateral: Secondary | ICD-10-CM

## 2017-02-23 ENCOUNTER — Ambulatory Visit: Payer: Self-pay | Admitting: Family Medicine

## 2017-02-28 ENCOUNTER — Encounter: Payer: Self-pay | Admitting: Family Medicine

## 2017-02-28 DIAGNOSIS — Z9889 Other specified postprocedural states: Secondary | ICD-10-CM

## 2017-04-02 DIAGNOSIS — H269 Unspecified cataract: Secondary | ICD-10-CM | POA: Diagnosis not present

## 2017-04-28 ENCOUNTER — Encounter: Payer: Self-pay | Admitting: Family Medicine

## 2017-05-04 ENCOUNTER — Ambulatory Visit: Payer: Medicaid Other | Admitting: Family Medicine

## 2017-05-04 ENCOUNTER — Telehealth: Payer: Self-pay

## 2017-05-04 NOTE — Telephone Encounter (Signed)
Called [facility] [place], spoke w/ Mckil informed her that this pt's u/s report has Dr.Lada listed as the authorizing provider and this is not correct. Informed her that u/s report needs to be addended, and a copy of this report should be sent to the correct provider (Dr.Newton) Mckil gives verbal understanding and states that she will have this completed.

## 2017-05-18 ENCOUNTER — Encounter: Payer: Self-pay | Admitting: Family Medicine

## 2017-05-31 DIAGNOSIS — N939 Abnormal uterine and vaginal bleeding, unspecified: Secondary | ICD-10-CM | POA: Diagnosis not present

## 2017-06-06 DIAGNOSIS — K219 Gastro-esophageal reflux disease without esophagitis: Secondary | ICD-10-CM | POA: Diagnosis not present

## 2017-06-06 DIAGNOSIS — N939 Abnormal uterine and vaginal bleeding, unspecified: Secondary | ICD-10-CM | POA: Diagnosis not present

## 2017-06-06 DIAGNOSIS — Z01818 Encounter for other preprocedural examination: Secondary | ICD-10-CM | POA: Diagnosis not present

## 2017-06-06 DIAGNOSIS — G4733 Obstructive sleep apnea (adult) (pediatric): Secondary | ICD-10-CM | POA: Diagnosis not present

## 2017-06-06 DIAGNOSIS — F329 Major depressive disorder, single episode, unspecified: Secondary | ICD-10-CM | POA: Diagnosis not present

## 2017-06-06 DIAGNOSIS — I10 Essential (primary) hypertension: Secondary | ICD-10-CM | POA: Diagnosis not present

## 2017-06-08 DIAGNOSIS — N939 Abnormal uterine and vaginal bleeding, unspecified: Secondary | ICD-10-CM | POA: Diagnosis not present

## 2017-06-08 DIAGNOSIS — N83202 Unspecified ovarian cyst, left side: Secondary | ICD-10-CM | POA: Diagnosis not present

## 2017-06-08 DIAGNOSIS — N83201 Unspecified ovarian cyst, right side: Secondary | ICD-10-CM | POA: Diagnosis not present

## 2017-06-08 DIAGNOSIS — Z6841 Body Mass Index (BMI) 40.0 and over, adult: Secondary | ICD-10-CM | POA: Diagnosis not present

## 2017-06-15 ENCOUNTER — Encounter: Payer: Self-pay | Admitting: Family Medicine

## 2017-06-15 HISTORY — PX: ABDOMINAL HYSTERECTOMY: SHX81

## 2017-06-25 ENCOUNTER — Other Ambulatory Visit: Payer: Medicaid Other

## 2017-06-29 ENCOUNTER — Other Ambulatory Visit: Payer: Medicaid Other

## 2017-07-01 NOTE — Progress Notes (Signed)
Closing out lab/order note open since:  July 2018

## 2017-07-01 NOTE — Progress Notes (Signed)
Closing out scanned document note open since  04/28/17

## 2017-07-01 NOTE — Progress Notes (Signed)
Closing out lab/order note open since:  02/2017

## 2017-07-01 NOTE — Progress Notes (Signed)
Closing out scanned document note open since  06/15/17 Already wet signed

## 2017-07-02 ENCOUNTER — Other Ambulatory Visit: Payer: Self-pay | Admitting: Oncology

## 2017-07-09 ENCOUNTER — Ambulatory Visit
Admission: RE | Admit: 2017-07-09 | Discharge: 2017-07-09 | Disposition: A | Discharge status: Home / Self Care | Payer: Medicaid Other | Source: Ambulatory Visit | Attending: Oncology | Admitting: Oncology

## 2017-07-09 DIAGNOSIS — C50412 Malignant neoplasm of upper-outer quadrant of left female breast: Secondary | ICD-10-CM

## 2017-07-09 DIAGNOSIS — Z5181 Encounter for therapeutic drug level monitoring: Secondary | ICD-10-CM

## 2017-07-09 DIAGNOSIS — Z853 Personal history of malignant neoplasm of breast: Secondary | ICD-10-CM | POA: Diagnosis not present

## 2017-07-09 DIAGNOSIS — Z79810 Long term (current) use of selective estrogen receptor modulators (SERMs): Secondary | ICD-10-CM | POA: Diagnosis not present

## 2017-07-09 DIAGNOSIS — Z170 Estrogen receptor positive status [ER+]: Principal | ICD-10-CM

## 2017-07-09 DIAGNOSIS — N6001 Solitary cyst of right breast: Secondary | ICD-10-CM | POA: Insufficient documentation

## 2017-07-29 ENCOUNTER — Telehealth: Payer: Self-pay | Admitting: Family Medicine

## 2017-07-29 NOTE — Telephone Encounter (Signed)
I refilled her med, but she needs an appointment please Last visit was July Please schedule her for HTN and prediabetes and high cholesterol in the next 30 days Thank you

## 2017-07-30 NOTE — Telephone Encounter (Signed)
Spoke with patient and appt made for 08-23-17

## 2017-08-15 ENCOUNTER — Encounter: Payer: Self-pay | Admitting: Family Medicine

## 2017-08-15 ENCOUNTER — Other Ambulatory Visit: Payer: Self-pay

## 2017-08-15 DIAGNOSIS — R350 Frequency of micturition: Secondary | ICD-10-CM

## 2017-08-15 DIAGNOSIS — R300 Dysuria: Secondary | ICD-10-CM

## 2017-08-15 DIAGNOSIS — R1030 Lower abdominal pain, unspecified: Secondary | ICD-10-CM

## 2017-08-15 NOTE — Progress Notes (Signed)
Referral placed per mychart message.

## 2017-08-15 NOTE — Telephone Encounter (Signed)
Please refer, thank you

## 2017-08-21 ENCOUNTER — Inpatient Hospital Stay: Payer: Medicaid Other | Admitting: Oncology

## 2017-08-23 ENCOUNTER — Ambulatory Visit: Payer: Medicaid Other | Admitting: Family Medicine

## 2017-08-29 ENCOUNTER — Ambulatory Visit: Payer: Medicaid Other | Admitting: Urology

## 2017-09-13 ENCOUNTER — Ambulatory Visit: Payer: Medicaid Other | Admitting: Urology

## 2017-09-14 ENCOUNTER — Ambulatory Visit: Payer: Medicaid Other | Admitting: Oncology

## 2017-09-17 ENCOUNTER — Encounter: Payer: Self-pay | Admitting: Family Medicine

## 2017-09-18 ENCOUNTER — Other Ambulatory Visit: Payer: Self-pay

## 2017-09-18 DIAGNOSIS — B079 Viral wart, unspecified: Secondary | ICD-10-CM

## 2017-10-02 ENCOUNTER — Ambulatory Visit: Payer: Medicaid Other | Admitting: Family Medicine

## 2017-10-02 ENCOUNTER — Encounter: Payer: Self-pay | Admitting: Family Medicine

## 2017-10-02 VITALS — BP 132/84 | HR 93 | Temp 98.4°F | Resp 14 | Ht 69.0 in | Wt 372.0 lb

## 2017-10-02 DIAGNOSIS — K219 Gastro-esophageal reflux disease without esophagitis: Secondary | ICD-10-CM | POA: Diagnosis not present

## 2017-10-02 DIAGNOSIS — Z170 Estrogen receptor positive status [ER+]: Secondary | ICD-10-CM

## 2017-10-02 DIAGNOSIS — R7303 Prediabetes: Secondary | ICD-10-CM

## 2017-10-02 DIAGNOSIS — R7309 Other abnormal glucose: Secondary | ICD-10-CM

## 2017-10-02 DIAGNOSIS — F32A Depression, unspecified: Secondary | ICD-10-CM

## 2017-10-02 DIAGNOSIS — Z6841 Body Mass Index (BMI) 40.0 and over, adult: Secondary | ICD-10-CM

## 2017-10-02 DIAGNOSIS — C50412 Malignant neoplasm of upper-outer quadrant of left female breast: Secondary | ICD-10-CM

## 2017-10-02 DIAGNOSIS — F329 Major depressive disorder, single episode, unspecified: Secondary | ICD-10-CM | POA: Diagnosis not present

## 2017-10-02 DIAGNOSIS — I10 Essential (primary) hypertension: Secondary | ICD-10-CM | POA: Diagnosis not present

## 2017-10-02 LAB — POCT GLYCOSYLATED HEMOGLOBIN (HGB A1C): Hemoglobin A1C: 6.6 % — AB (ref 4.0–5.6)

## 2017-10-02 MED ORDER — AMLODIPINE BESYLATE 10 MG PO TABS: 10.0000 mg | ORAL_TABLET | Freq: Every day | ORAL | 3 refills | Status: DC

## 2017-10-02 MED ORDER — METFORMIN HCL ER 500 MG PO TB24: 500.0000 mg | ORAL_TABLET | Freq: Every day | ORAL | 0 refills | Status: DC

## 2017-10-02 NOTE — Assessment & Plan Note (Signed)
Star tmetformin; return in one week for repeat A1c and confirm if truly type 2 DM

## 2017-10-02 NOTE — Assessment & Plan Note (Signed)
On tamoxifen treatment; dealing with hot flashes, managed by oncologist

## 2017-10-02 NOTE — Assessment & Plan Note (Signed)
Increase the amlodipine to 10 mg daily; work on weight loss

## 2017-10-02 NOTE — Assessment & Plan Note (Signed)
Discussed trigger avoidance; weight loss would help

## 2017-10-02 NOTE — Progress Notes (Signed)
BP 132/84   Pulse 93   Temp 98.4 F (36.9 C) (Oral)   Resp 14   Ht 5' 9"  (1.753 m)   Wt (!) 372 lb (168.7 kg)   LMP 02/18/2015 (Approximate)   SpO2 98%   BMI 54.93 kg/m    Subjective:    Patient ID: Elizabeth Hoffman, female    DOB: 1971-05-13, 47 y.o.   MRN: [id number]  HPI: Elizabeth Hoffman is a 47 y.o. female  Chief Complaint  Patient presents with  . Medication Refill    HPI Hypertension; on amlodipine 7.5 mg; not usually adding salt to her food; not really looking at sodium content Eating out maybe once a week; maybe twice a month On HCTZ, controlling leg swelling for the most part; just a little sweling if in the car for a while; no DVT BP Readings from Last 3 Encounters:  10/02/17 132/84  02/20/17 124/76  11/28/16 134/76   On sertraline for anxiety, depression; doing well; patient wishes to leave the dose where it is  GERD; reflux; she has symptoms if she does not take that every day; she will throw up sometimes; going on for years and years; used to control with Tums; she tries to stay away from lemonade, citric and acidic stuff; no blood in the stool  Hot flashes; on tamoxifen; Dr. Janese Banks is overseeing that  Prediabetes; A1c was 6.1 in October; no dry mouth, drinks a lot; no blurred vision for hours; sometimes getting up 2-3 x at night to urinate; always diet drinks; white bread with sandwiches  She has lost five pounds; she went for several months and thought she was going to have weight loss surgery; then husband had surgery, other things came up; she didn't change her mind; she was really gung-ho then it got delayed She has done Weight Watchers Before her daughter was born, she lost 100 pounds in between children; cut back on carbs and didn't eat red meat She hates exercising When you get so big, everything is hard to do She is active, not just eating bonbons sitting in the recliner She does drink enough water; has almost quit drinking sodas; she cut out  almost all the caffeine, but still had issues with incontinence Menu lists are restrictive   Depression screen [facility] 2/9 10/02/2017 11/28/2016 11/03/2016 11/30/2015 09/16/2015  Decreased Interest 0 0 0 0 0  Down, Depressed, Hopeless 0 0 0 0 0  PHQ - 2 Score 0 0 0 0 0    Relevant past medical, surgical, family and social history reviewed Past Medical History:  Diagnosis Date  . Anxiety   . Arthritis   . Breast cancer ([place]) 2016  . Breast cancer ([place])   . Depression   . Hot flashes   . Hypertension   . OSA on CPAP 11/03/2016   Past Surgical History:  Procedure Laterality Date  . ABDOMINAL HYSTERECTOMY  06/15/2017  . BREAST EXCISIONAL BIOPSY Left 03/18/2015   breast ca +  . BREAST LUMPECTOMY WITH RADIOACTIVE SEED LOCALIZATION Left 03/18/2015   Procedure: BREAST LUMPECTOMY WITH RADIOACTIVE SEED LOCALIZATION;  Surgeon: Erroll Luna, MD;  Location: [facility];  Service: General;  Laterality: Left;  . CERVICAL BIOPSY  W/ LOOP ELECTRODE EXCISION    . CHOLECYSTECTOMY    . HERNIA REPAIR    . MASTECTOMY, PARTIAL Left 02/15/2015  . NASAL SEPTUM SURGERY    . TONSILLECTOMY AND ADENOIDECTOMY    . UVULECTOMY     Family History  Adopted: Yes  Family history unknown: Yes   Social History   Tobacco Use  . Smoking status: Never Smoker  . Smokeless tobacco: Never Used  Substance Use Topics  . Alcohol use: No    Alcohol/week: 0.0 oz  . Drug use: No    Interim medical history since last visit reviewed. Allergies and medications reviewed  Review of Systems Per HPI unless specifically indicated above     Objective:    BP 132/84   Pulse 93   Temp 98.4 F (36.9 C) (Oral)   Resp 14   Ht 5' 9"  (1.753 m)   Wt (!) 372 lb (168.7 kg)   LMP 02/18/2015 (Approximate)   SpO2 98%   BMI 54.93 kg/m   Wt Readings from Last 3 Encounters:  10/02/17 (!) 372 lb (168.7 kg)  02/20/17 (!) 377 lb (171 kg)  11/28/16 (!) 371 lb 11.2 oz (168.6 kg)    Physical Exam  Constitutional: She  appears well-developed and well-nourished. No distress.  HENT:  Head: Normocephalic and atraumatic.  Eyes: EOM are normal. No scleral icterus.  Neck: No thyromegaly present.  Cardiovascular: Normal rate, regular rhythm and normal heart sounds.  No murmur heard. Pulmonary/Chest: Effort normal and breath sounds normal. No respiratory distress. She has no wheezes.  Abdominal: Soft. Bowel sounds are normal. She exhibits no distension.  Musculoskeletal: Normal range of motion. She exhibits no edema.  Neurological: She is alert. She exhibits normal muscle tone.  Skin: Skin is warm and dry. She is not diaphoretic. No pallor.  Psychiatric: She has a normal mood and affect. Her behavior is normal. Judgment and thought content normal.   Diabetic Foot Form - Detailed   Diabetic Foot Exam - detailed Diabetic Foot exam was performed with the following findings:  Yes 10/02/2017  6:53 PM  Visual Foot Exam completed.:  Yes  Pulse Foot Exam completed.:  Yes  Right Dorsalis Pedis:  Present Left Dorsalis Pedis:  Present  Sensory Foot Exam Completed.:  Yes Semmes-Weinstein Monofilament Test R Site 1-Great Toe:  Pos L Site 1-Great Toe:  Pos        Results for orders placed or performed in visit on 10/02/17  POCT HgB A1C  Result Value Ref Range   Hemoglobin A1C 6.6 (A) 4.0 - 5.6 %   HbA1c, POC (prediabetic range)  5.7 - 6.4 %   HbA1c, POC (controlled diabetic range)  0.0 - 7.0 %      Assessment & Plan:   Problem List Items Addressed This Visit      Cardiovascular and Mediastinum   Hypertension goal BP (blood pressure) < 140/90    Increase the amlodipine to 10 mg daily; work on weight loss      Relevant Medications   amLODipine (NORVASC) 10 MG tablet     Digestive   GERD without esophagitis    Discussed trigger avoidance; weight loss would help        Other   Pre-diabetes - Primary    Star tmetformin; return in one week for repeat A1c and confirm if truly type 2 DM      Relevant  Orders   POCT HgB A1C (Completed)   Malignant neoplasm of upper-outer quadrant of left breast in female, estrogen receptor positive ([place])    On tamoxifen treatment; dealing with hot flashes, managed by oncologist      Depressive disorder    Continue sertraline; patient wishes to continue same dose       Other Visit Diagnoses  BMI 50.0-59.9, adult [facility])       encouragement given; she may consider bariatric surgery   Relevant Medications   metFORMIN (GLUCOPHAGE-XR) 500 MG 24 hr tablet   Elevated hemoglobin A1c       return in one week for confirmation before actually calling this type 2 diabetes; weight loss key       Follow up plan: Return in about 1 week (around 10/09/2017) for follow-up visit with Dr. Sanda Klein and in-house A1c.  An after-visit summary was printed and given to the patient at [facility].  Please see the patient instructions which may contain other information and recommendations beyond what is mentioned above in the assessment and plan.  Meds ordered this encounter  Medications  . amLODipine (NORVASC) 10 MG tablet    Sig: Take 1 tablet (10 mg total) by mouth daily.    Dispense:  90 tablet    Refill:  3  . metFORMIN (GLUCOPHAGE-XR) 500 MG 24 hr tablet    Sig: Take 1 tablet (500 mg total) by mouth daily with breakfast. For 3 days, then two pills daily for 3 days, then 3 pills daily    Dispense:  81 tablet    Refill:  0    Orders Placed This Encounter  Procedures  . POCT HgB A1C

## 2017-10-02 NOTE — Patient Instructions (Addendum)
Check out the information at familydoctor.org entitled "Nutrition for Weight Loss: What You Need to Know about Fad Diets" Try to lose between 1-2 pounds per week by taking in fewer calories and burning off more calories You can succeed by limiting portions, limiting foods dense in calories and fat, becoming more active, and drinking 8 glasses of water a day (64 ounces) Don't skip meals, especially breakfast, as skipping meals may alter your metabolism Do not use over-the-counter weight loss pills or gimmicks that claim rapid weight loss A healthy BMI (or body mass index) is between 18.5 and 24.9 You can calculate your ideal BMI at the [place] website ClubMonetize.fr  Increase amlodipine to 10 mg daily Our goal for you is to get that top number under 130 mmHg Try to follow the DASH guidelines (DASH stands for Dietary Approaches to Stop Hypertension). Try to limit the sodium in your diet to no more than 1,538m of sodium per day. Certainly try to not exceed 2,000 mg per day at the very most. Do not add salt when cooking or at the table.  Check the sodium amount on labels when shopping, and choose items lower in sodium when given a choice. Avoid or limit foods that already contain a lot of sodium. Eat a diet rich in fruits and vegetables and whole grains, and try to lose weight if overweight or obese  Preventing Unhealthy Weight Gain, Adult Staying at a healthy weight is important. When fat builds up in your body, you may become overweight or obese. These conditions put you at greater risk for developing certain health problems, such as heart disease, diabetes, sleeping problems, joint problems, and some cancers. Unhealthy weight gain is often the result of making unhealthy choices in what you eat. It is also a result of not getting enough exercise. You can make changes to your lifestyle to prevent obesity and stay as healthy as possible. What nutrition  changes can be made? To maintain a healthy weight and prevent obesity:  Eat only as much as your body needs. To do this: ? Pay attention to signs that you are hungry or full. Stop eating as soon as you feel full. ? If you feel hungry, try drinking water first. Drink enough water so your urine is clear or pale yellow. ? Eat smaller portions. ? Look at serving sizes on food labels. Most foods contain more than one serving per container. ? Eat the recommended amount of calories for your gender and activity level. While most active people should eat around 2,000 calories per day, if you are trying to lose weight or are not very active, you main need to eat less calories. Talk to your health care provider or dietitian about how many calories you should eat each day.  Choose healthy foods, such as: ? Fruits and vegetables. Try to fill at least half of your plate at each meal with fruits and vegetables. ? Whole grains, such as whole wheat bread, brown rice, and quinoa. ? Lean meats, such as chicken or fish. ? Other healthy proteins, such as beans, eggs, or tofu. ? Healthy fats, such as nuts, seeds, fatty fish, and olive oil. ? Low-fat or fat-free dairy.  Check food labels and avoid food and drinks that: ? Are high in calories. ? Have added sugar. ? Are high in sodium. ? Have saturated fats or trans fats.  Limit how much you eat of the following foods: ? Prepackaged meals. ? Fast food. ? Fried foods. ? Processed meat,  such as bacon, sausage, and deli meats. ? Fatty cuts of red meat and poultry with skin.  Cook foods in healthier ways, such as by baking, broiling, or grilling.  When grocery shopping, try to shop around the outside of the store. This helps you buy mostly fresh foods and avoid canned and prepackaged foods.  What lifestyle changes can be made?  Exercise at least 30 minutes 5 or more days each week. Exercising includes brisk walking, yard work, biking, running, swimming, and  team sports like basketball and soccer. Ask your health care provider which exercises are safe for you.  Do not use any products that contain nicotine or tobacco, such as cigarettes and e-cigarettes. If you need help quitting, ask your health care provider.  Limit alcohol intake to no more than 1 drink a day for nonpregnant women and 2 drinks a day for men. One drink equals 12 oz of beer, 5 oz of wine, or 1 oz of hard liquor.  Try to get 7-9 hours of sleep each night. What other changes can be made?  Keep a food and activity journal to keep track of: ? What you ate and how many calories you had. Remember to count sauces, dressings, and side dishes. ? Whether you were active, and what exercises you did. ? Your calorie, weight, and activity goals.  Check your weight regularly. Track any changes. If you notice you have gained weight, make changes to your diet or activity routine.  Avoid taking weight-loss medicines or supplements. Talk to your health care provider before starting any new medicine or supplement.  Talk to your health care provider before trying any new diet or exercise plan. Why are these changes important? Eating healthy, staying active, and having healthy habits not only help prevent obesity, they also:  Help you to manage stress and emotions.  Help you to connect with friends and family.  Improve your self-esteem.  Improve your sleep.  Prevent long-term health problems.  What can happen if changes are not made? Being obese or overweight can cause you to develop joint or bone problems, which can make it hard for you to stay active or do activities you enjoy. Being obese or overweight also puts stress on your heart and lungs and can lead to health problems like diabetes, heart disease, and some cancers. Where to find more information: Talk with your health care provider or a dietitian about healthy eating and healthy lifestyle choices. You may also find other  information through these resources:  [place] Department of Agriculture MyPlate: FormerBoss.no  American Heart Association: www.heart.org  Centers for Disease Control and Prevention: http://www.wolf.info/  Summary  Staying at a healthy weight is important. It helps prevent certain diseases and health problems, such as heart disease, diabetes, joint problems, sleep disorders, and some cancers.  Being obese or overweight can cause you to develop joint or bone problems, which can make it hard for you to stay active or do activities you enjoy.  You can prevent unhealthy weight gain by eating a healthy diet, exercising regularly, not smoking, limiting alcohol, and getting enough sleep.  Talk with your health care provider or a dietitian for guidance about healthy eating and healthy lifestyle choices. This information is not intended to replace advice given to you by your health care provider. Make sure you discuss any questions you have with your health care provider. Document Released: 05/02/2016 Document Revised: 06/07/2016 Document Reviewed: 06/07/2016 Elsevier Interactive Patient Education  2018 [employer].  Obesity, Adult  Obesity is the condition of having too much total body fat. Being overweight or obese means that your weight is greater than what is considered healthy for your body size. Obesity is determined by a measurement called BMI. BMI is an estimate of body fat and is calculated from height and weight. For adults, a BMI of 30 or higher is considered obese. Obesity can eventually lead to other health concerns and major illnesses, including:  Stroke.  Coronary artery disease (CAD).  Type 2 diabetes.  Some types of cancer, including cancers of the colon, breast, uterus, and gallbladder.  Osteoarthritis.  High blood pressure (hypertension).  High cholesterol.  Sleep apnea.  Gallbladder stones.  Infertility problems.  What are the causes? The main cause of obesity is  taking in (consuming) more calories than your body uses for energy. Other factors that contribute to this condition may include:  Being born with genes that make you more likely to become obese.  Having a medical condition that causes obesity. These conditions include: ? Hypothyroidism. ? Polycystic ovarian syndrome (PCOS). ? Binge-eating disorder. ? Cushing syndrome.  Taking certain medicines, such as steroids, antidepressants, and seizure medicines.  Not being physically active (sedentary lifestyle).  Living where there are limited places to exercise safely or buy healthy foods.  Not getting enough sleep.  What increases the risk? The following factors may increase your risk of this condition:  Having a family history of obesity.  Being a woman of African-American descent.  Being a man of Hispanic descent.  What are the signs or symptoms? Having excessive body fat is the main symptom of this condition. How is this diagnosed? This condition may be diagnosed based on:  Your symptoms.  Your medical history.  A physical exam. Your health care provider may measure: ? Your BMI. If you are an adult with a BMI between 25 and less than 30, you are considered overweight. If you are an adult with a BMI of 30 or higher, you are considered obese. ? The distances around your hips and your waist (circumferences). These may be compared to each other to help diagnose your condition. ? Your skinfold thickness. Your health care provider may gently pinch a fold of your skin and measure it.  How is this treated? Treatment for this condition often includes changing your lifestyle. Treatment may include some or all of the following:  Dietary changes. Work with your health care provider and a dietitian to set a weight-loss goal that is healthy and reasonable for you. Dietary changes may include eating: ? Smaller portions. A portion size is the amount of a particular food that is healthy for  you to eat at one time. This varies from person to person. ? Low-calorie or low-fat options. ? More whole grains, fruits, and vegetables.  Regular physical activity. This may include aerobic activity (cardio) and strength training.  Medicine to help you lose weight. Your health care provider may prescribe medicine if you are unable to lose 1 pound a week after 6 weeks of eating more healthily and doing more physical activity.  Surgery. Surgical options may include gastric banding and gastric bypass. Surgery may be done if: ? Other treatments have not helped to improve your condition. ? You have a BMI of 40 or higher. ? You have life-threatening health problems related to obesity.  Follow these instructions at home:  Eating and drinking   Follow recommendations from your health care provider about what you eat and drink. Your health care  provider may advise you to: ? Limit fast foods, sweets, and processed snack foods. ? Choose low-fat options, such as low-fat milk instead of whole milk. ? Eat 5 or more servings of fruits or vegetables every day. ? Eat at home more often. This gives you more control over what you eat. ? Choose healthy foods when you eat out. ? Learn what a healthy portion size is. ? Keep low-fat snacks on hand. ? Avoid sugary drinks, such as soda, fruit juice, iced tea sweetened with sugar, and flavored milk. ? Eat a healthy breakfast.  Drink enough water to keep your urine clear or pale yellow.  Do not go without eating for long periods of time (do not fast) or follow a fad diet. Fasting and fad diets can be unhealthy and even dangerous. Physical Activity  Exercise regularly, as told by your health care provider. Ask your health care provider what types of exercise are safe for you and how often you should exercise.  Warm up and stretch before being active.  Cool down and stretch after being active.  Rest between periods of activity. Lifestyle  Limit the  time that you spend in front of your TV, computer, or video game system.  Find ways to reward yourself that do not involve food.  Limit alcohol intake to no more than 1 drink a day for nonpregnant women and 2 drinks a day for men. One drink equals 12 oz of beer, 5 oz of wine, or 1 oz of hard liquor. General instructions  Keep a weight loss journal to keep track of the food you eat and how much you exercise you get.  Take over-the-counter and prescription medicines only as told by your health care provider.  Take vitamins and supplements only as told by your health care provider.  Consider joining a support group. Your health care provider may be able to recommend a support group.  Keep all follow-up visits as told by your health care provider. This is important. Contact a health care provider if:  You are unable to meet your weight loss goal after 6 weeks of dietary and lifestyle changes. This information is not intended to replace advice given to you by your health care provider. Make sure you discuss any questions you have with your health care provider. Document Released: 06/08/2004 Document Revised: 10/04/2015 Document Reviewed: 02/17/2015 Elsevier Interactive Patient Education  2018 [place] Eating Plan DASH stands for "Dietary Approaches to Stop Hypertension." The DASH eating plan is a healthy eating plan that has been shown to reduce high blood pressure (hypertension). It may also reduce your risk for type 2 diabetes, heart disease, and stroke. The DASH eating plan may also help with weight loss. What are tips for following this plan? General guidelines  Avoid eating more than 2,300 mg (milligrams) of salt (sodium) a day. If you have hypertension, you may need to reduce your sodium intake to 1,500 mg a day.  Limit alcohol intake to no more than 1 drink a day for nonpregnant women and 2 drinks a day for men. One drink equals 12 oz of beer, 5 oz of wine, or 1 oz of  hard liquor.  Work with your health care provider to maintain a healthy body weight or to lose weight. Ask what an ideal weight is for you.  Get at least 30 minutes of exercise that causes your heart to beat faster (aerobic exercise) most days of the week. Activities may include walking, swimming, or  biking.  Work with your health care provider or diet and nutrition specialist (dietitian) to adjust your eating plan to your individual calorie needs. Reading food labels  Check food labels for the amount of sodium per serving. Choose foods with less than 5 percent of the Daily Value of sodium. Generally, foods with less than 300 mg of sodium per serving fit into this eating plan.  To find whole grains, look for the word "whole" as the first word in the ingredient list. Shopping  Buy products labeled as "low-sodium" or "no salt added."  Buy fresh foods. Avoid canned foods and premade or frozen meals. Cooking  Avoid adding salt when cooking. Use salt-free seasonings or herbs instead of table salt or sea salt. Check with your health care provider or pharmacist before using salt substitutes.  Do not fry foods. Cook foods using healthy methods such as baking, boiling, grilling, and broiling instead.  Cook with heart-healthy oils, such as olive, canola, soybean, or sunflower oil. Meal planning   Eat a balanced diet that includes: ? 5 or more servings of fruits and vegetables each day. At each meal, try to fill half of your plate with fruits and vegetables. ? Up to 6-8 servings of whole grains each day. ? Less than 6 oz of lean meat, poultry, or fish each day. A 3-oz serving of meat is about the same size as a deck of cards. One egg equals 1 oz. ? 2 servings of low-fat dairy each day. ? A serving of nuts, seeds, or beans 5 times each week. ? Heart-healthy fats. Healthy fats called Omega-3 fatty acids are found in foods such as flaxseeds and coldwater fish, like sardines, salmon, and  mackerel.  Limit how much you eat of the following: ? Canned or prepackaged foods. ? Food that is high in trans fat, such as fried foods. ? Food that is high in saturated fat, such as fatty meat. ? Sweets, desserts, sugary drinks, and other foods with added sugar. ? Full-fat dairy products.  Do not salt foods before eating.  Try to eat at least 2 vegetarian meals each week.  Eat more home-cooked food and less restaurant, buffet, and fast food.  When eating at a restaurant, ask that your food be prepared with less salt or no salt, if possible. What foods are recommended? The items listed may not be a complete list. Talk with your dietitian about what dietary choices are best for you. Grains Whole-grain or whole-wheat bread. Whole-grain or whole-wheat pasta. Brown rice. [place]. Bulgur. Whole-grain and low-sodium cereals. Pita bread. Low-fat, low-sodium crackers. Whole-wheat flour tortillas. Vegetables Fresh or frozen vegetables (raw, steamed, roasted, or grilled). Low-sodium or reduced-sodium tomato and vegetable juice. Low-sodium or reduced-sodium tomato sauce and tomato paste. Low-sodium or reduced-sodium canned vegetables. Fruits All fresh, dried, or frozen fruit. Canned fruit in natural juice (without added sugar). Meat and other protein foods Skinless chicken or [place]. Ground chicken or [place]. Pork with fat trimmed off. Fish and seafood. Egg whites. Dried beans, peas, or lentils. Unsalted nuts, nut butters, and seeds. Unsalted canned beans. Lean cuts of beef with fat trimmed off. Low-sodium, lean deli meat. Dairy Low-fat (1%) or fat-free (skim) milk. Fat-free, low-fat, or reduced-fat cheeses. Nonfat, low-sodium ricotta or cottage cheese. Low-fat or nonfat yogurt. Low-fat, low-sodium cheese. Fats and oils Soft margarine without trans fats. Vegetable oil. Low-fat, reduced-fat, or light mayonnaise and salad dressings (reduced-sodium). Canola, safflower, olive, soybean, and  sunflower oils. Avocado. Seasoning and other foods Herbs. Spices.  Seasoning mixes without salt. Unsalted popcorn and pretzels. Fat-free sweets. What foods are not recommended? The items listed may not be a complete list. Talk with your dietitian about what dietary choices are best for you. Grains Baked goods made with fat, such as croissants, muffins, or some breads. Dry pasta or rice meal packs. Vegetables Creamed or fried vegetables. Vegetables in a cheese sauce. Regular canned vegetables (not low-sodium or reduced-sodium). Regular canned tomato sauce and paste (not low-sodium or reduced-sodium). Regular tomato and vegetable juice (not low-sodium or reduced-sodium). Angie Fava. Olives. Fruits Canned fruit in a light or heavy syrup. Fried fruit. Fruit in cream or butter sauce. Meat and other protein foods Fatty cuts of meat. Ribs. Fried meat. Berniece Salines. Sausage. Bologna and other processed lunch meats. Salami. Fatback. Hotdogs. Bratwurst. Salted nuts and seeds. Canned beans with added salt. Canned or smoked fish. Whole eggs or egg yolks. Chicken or [place] with skin. Dairy Whole or 2% milk, cream, and half-and-half. Whole or full-fat cream cheese. Whole-fat or sweetened yogurt. Full-fat cheese. Nondairy creamers. Whipped toppings. Processed cheese and cheese spreads. Fats and oils Butter. Stick margarine. Lard. Shortening. Ghee. Bacon fat. Tropical oils, such as coconut, palm kernel, or palm oil. Seasoning and other foods Salted popcorn and pretzels. Onion salt, garlic salt, seasoned salt, table salt, and sea salt. Worcestershire sauce. Tartar sauce. Barbecue sauce. Teriyaki sauce. Soy sauce, including reduced-sodium. Steak sauce. Canned and packaged gravies. Fish sauce. Oyster sauce. Cocktail sauce. Horseradish that you find on the shelf. Ketchup. Mustard. Meat flavorings and tenderizers. Bouillon cubes. Hot sauce and Tabasco sauce. Premade or packaged marinades. Premade or packaged taco seasonings.  Relishes. Regular salad dressings. Where to find more information:  National Heart, Lung, and [place]: https://wilson-eaton.com/  American Heart Association: www.heart.org Summary  The DASH eating plan is a healthy eating plan that has been shown to reduce high blood pressure (hypertension). It may also reduce your risk for type 2 diabetes, heart disease, and stroke.  With the DASH eating plan, you should limit salt (sodium) intake to 2,300 mg a day. If you have hypertension, you may need to reduce your sodium intake to 1,500 mg a day.  When on the DASH eating plan, aim to eat more fresh fruits and vegetables, whole grains, lean proteins, low-fat dairy, and heart-healthy fats.  Work with your health care provider or diet and nutrition specialist (dietitian) to adjust your eating plan to your individual calorie needs. This information is not intended to replace advice given to you by your health care provider. Make sure you discuss any questions you have with your health care provider. Document Released: 04/20/2011 Document Revised: 04/24/2016 Document Reviewed: 04/24/2016 Elsevier Interactive Patient Education  2018 [employer]. Prediabetes Eating Plan Prediabetes-also called impaired glucose tolerance or impaired fasting glucose-is a condition that causes blood sugar (blood glucose) levels to be higher than normal. Following a healthy diet can help to keep prediabetes under control. It can also help to lower the risk of type 2 diabetes and heart disease, which are increased in people who have prediabetes. Along with regular exercise, a healthy diet:  Promotes weight loss.  Helps to control blood sugar levels.  Helps to improve the way that the body uses insulin.  What do I need to know about this eating plan?  Use the glycemic index (GI) to plan your meals. The index tells you how quickly a food will raise your blood sugar. Choose low-GI foods. These foods take a longer time to raise  blood sugar.  Pay close attention to the amount of carbohydrates in the food that you eat. Carbohydrates increase blood sugar levels.  Keep track of how many calories you take in. Eating the right amount of calories will help you to achieve a healthy weight. Losing about 7 percent of your starting weight can help to prevent type 2 diabetes.  You may want to follow a Mediterranean diet. This diet includes a lot of vegetables, lean meats or fish, whole grains, fruits, and healthy oils and fats. What foods can I eat? Grains Whole grains, such as whole-wheat or whole-grain breads, crackers, cereals, and pasta. Unsweetened oatmeal. Bulgur. Barley. Quinoa. Brown rice. Corn or whole-wheat flour tortillas or taco shells. Vegetables Lettuce. Spinach. Peas. Beets. Cauliflower. Cabbage. Broccoli. Carrots. Tomatoes. Squash. Eggplant. Herbs. Peppers. Onions. Cucumbers. Brussels sprouts. Fruits Berries. Bananas. Apples. Oranges. Grapes. Papaya. Mango. Pomegranate. Kiwi. Grapefruit. Cherries. Meats and Other Protein Sources Seafood. Lean meats, such as chicken and [place] or lean cuts of pork and beef. Tofu. Eggs. Nuts. Beans. Dairy Low-fat or fat-free dairy products, such as yogurt, cottage cheese, and cheese. Beverages Water. Tea. Coffee. Sugar-free or diet soda. Seltzer water. Milk. Milk alternatives, such as soy or almond milk. Condiments Mustard. Relish. Low-fat, low-sugar ketchup. Low-fat, low-sugar barbecue sauce. Low-fat or fat-free mayonnaise. Sweets and Desserts Sugar-free or low-fat pudding. Sugar-free or low-fat ice cream and other frozen treats. Fats and Oils Avocado. Walnuts. Olive oil. The items listed above may not be a complete list of recommended foods or beverages. Contact your dietitian for more options. What foods are not recommended? Grains Refined white flour and flour products, such as bread, pasta, snack foods, and cereals. Beverages Sweetened drinks, such as sweet iced tea  and soda. Sweets and Desserts Baked goods, such as cake, cupcakes, pastries, cookies, and cheesecake. The items listed above may not be a complete list of foods and beverages to avoid. Contact your dietitian for more information. This information is not intended to replace advice given to you by your health care provider. Make sure you discuss any questions you have with your health care provider. Document Released: 09/15/2014 Document Revised: 10/07/2015 Document Reviewed: 05/27/2014 Elsevier Interactive Patient Education  2017 [employer].

## 2017-10-02 NOTE — Assessment & Plan Note (Signed)
Continue sertraline; patient wishes to continue same dose

## 2017-10-05 ENCOUNTER — Ambulatory Visit: Payer: Medicaid Other | Admitting: Family Medicine

## 2017-10-05 ENCOUNTER — Encounter: Payer: Self-pay | Admitting: Family Medicine

## 2017-10-05 VITALS — BP 128/80 | HR 93 | Temp 98.2°F | Resp 14 | Ht 69.0 in | Wt 368.3 lb

## 2017-10-05 DIAGNOSIS — R300 Dysuria: Secondary | ICD-10-CM | POA: Diagnosis not present

## 2017-10-05 DIAGNOSIS — N3090 Cystitis, unspecified without hematuria: Secondary | ICD-10-CM | POA: Diagnosis not present

## 2017-10-05 DIAGNOSIS — R7309 Other abnormal glucose: Secondary | ICD-10-CM

## 2017-10-05 DIAGNOSIS — R7303 Prediabetes: Secondary | ICD-10-CM | POA: Diagnosis not present

## 2017-10-05 DIAGNOSIS — B078 Other viral warts: Secondary | ICD-10-CM | POA: Diagnosis not present

## 2017-10-05 DIAGNOSIS — I10 Essential (primary) hypertension: Secondary | ICD-10-CM | POA: Diagnosis not present

## 2017-10-05 LAB — POCT URINALYSIS DIPSTICK
Bilirubin, UA: NEGATIVE
Blood, UA: NEGATIVE
GLUCOSE UA: NEGATIVE
KETONES UA: NEGATIVE
Nitrite, UA: NEGATIVE
Odor: NORMAL
Protein, UA: NEGATIVE
SPEC GRAV UA: 1.025 (ref 1.010–1.025)
Urobilinogen, UA: 0.2 E.U./dL
pH, UA: 6.5 (ref 5.0–8.0)

## 2017-10-05 LAB — POCT GLYCOSYLATED HEMOGLOBIN (HGB A1C): Hemoglobin A1C: 6.4 % — AB (ref 4.0–5.6)

## 2017-10-05 MED ORDER — NITROFURANTOIN MONOHYD MACRO 100 MG PO CAPS: 100.0000 mg | ORAL_CAPSULE | Freq: Two times a day (BID) | ORAL | 0 refills | Status: DC

## 2017-10-05 NOTE — Progress Notes (Signed)
BP 128/80   Pulse 93   Temp 98.2 F (36.8 C) (Oral)   Resp 14   Ht 5' 9"  (1.753 m)   Wt (!) 368 lb 4.8 oz (167.1 kg)   LMP 02/18/2015 (Approximate)   SpO2 93%   BMI 54.39 kg/m    Subjective:    Patient ID: Elizabeth Hoffman, female    DOB: December 27, 1970, 47 y.o.   MRN: [id number]  HPI: Elizabeth Hoffman is a 47 y.o. female  Chief Complaint  Patient presents with  . Diabetes    elevated a1c  . Urinary Tract Infection    burning with urination    HPI Patient is here for follow-up to an elevated A1c; her last A1c was found to be 6.6 on Oct 02, 2017; she is here for a confirmatory test before we diagnose her officially with diabetes; she was started on metformin last week; chart reviewed; no other fasting glucose noted above 125 (she had a single 125, but not 126 or higher); does not like a lot of sweets; more of a "meat and potatoes" girl she says  Her BP was up so her amlodipine was increased from 7.5 mg to 10 mg daily; it shows better control today; she is much more aware of what she is eating and grocery shopping  She also thinks she has a urinary tract infection; urine is just a little "burny"; doesn't feel bad; no fevers; no suprapubic pain or pressure  Morbid obesity; she has already done the nutrition thing when she was interested in bariatric surgery; she is more aware of what she is buying now  Depression screen [facility] 2/9 10/02/2017 11/28/2016 11/03/2016 11/30/2015 09/16/2015  Decreased Interest 0 0 0 0 0  Down, Depressed, Hopeless 0 0 0 0 0  PHQ - 2 Score 0 0 0 0 0    Relevant past medical, surgical, family and social history reviewed Past Medical History:  Diagnosis Date  . Anxiety   . Arthritis   . Breast cancer ([place]) 2016  . Breast cancer ([place])   . Depression   . Hot flashes   . Hypertension   . OSA on CPAP 11/03/2016   Past Surgical History:  Procedure Laterality Date  . ABDOMINAL HYSTERECTOMY  06/15/2017  . BREAST EXCISIONAL BIOPSY Left 03/18/2015   breast  ca +  . BREAST LUMPECTOMY WITH RADIOACTIVE SEED LOCALIZATION Left 03/18/2015   Procedure: BREAST LUMPECTOMY WITH RADIOACTIVE SEED LOCALIZATION;  Surgeon: Erroll Luna, MD;  Location: [facility];  Service: General;  Laterality: Left;  . CERVICAL BIOPSY  W/ LOOP ELECTRODE EXCISION    . CHOLECYSTECTOMY    . HERNIA REPAIR    . MASTECTOMY, PARTIAL Left 02/15/2015  . NASAL SEPTUM SURGERY    . TONSILLECTOMY AND ADENOIDECTOMY    . UVULECTOMY     Family History  Adopted: Yes  Family history unknown: Yes   Social History   Tobacco Use  . Smoking status: Never Smoker  . Smokeless tobacco: Never Used  Substance Use Topics  . Alcohol use: No    Alcohol/week: 0.0 oz  . Drug use: No    Interim medical history since last visit reviewed. Allergies and medications reviewed  Review of Systems Per HPI unless specifically indicated above     Objective:    BP 128/80   Pulse 93   Temp 98.2 F (36.8 C) (Oral)   Resp 14   Ht 5' 9"  (1.753 m)   Wt (!) 368 lb 4.8  oz (167.1 kg)   LMP 02/18/2015 (Approximate)   SpO2 93%   BMI 54.39 kg/m   Wt Readings from Last 3 Encounters:  10/05/17 (!) 368 lb 4.8 oz (167.1 kg)  10/02/17 (!) 372 lb (168.7 kg)  02/20/17 (!) 377 lb (171 kg)    Physical Exam  Constitutional: She appears well-developed and well-nourished.  HENT:  Mouth/Throat: Mucous membranes are normal.  Eyes: EOM are normal. No scleral icterus.  Cardiovascular: Normal rate and regular rhythm.  Pulmonary/Chest: Effort normal and breath sounds normal.  Psychiatric: She has a normal mood and affect. Her behavior is normal.    Results for orders placed or performed in visit on 10/05/17  POCT HgB A1C  Result Value Ref Range   Hemoglobin A1C 6.4 (A) 4.0 - 5.6 %   HbA1c, POC (prediabetic range)  5.7 - 6.4 %   HbA1c, POC (controlled diabetic range)  0.0 - 7.0 %  POCT urinalysis dipstick  Result Value Ref Range   Color, UA drk yellow    Clarity, UA clear    Glucose, UA  Negative Negative   Bilirubin, UA neg    Ketones, UA neg    Spec Grav, UA 1.025 1.010 - 1.025   Blood, UA neg    pH, UA 6.5 5.0 - 8.0   Protein, UA Negative Negative   Urobilinogen, UA 0.2 0.2 or 1.0 E.U./dL   Nitrite, UA neg    Leukocytes, UA Small (1+) (A) Negative   Appearance clear    Odor normal       Assessment & Plan:   Problem List Items Addressed This Visit      Cardiovascular and Mediastinum   Hypertension goal BP (blood pressure) < 140/90    Improved on the higher dose of CCB        Other   Pre-diabetes    Repeat A1c is only 6.4%; will treat as PREdiabetes; continue the metformin; discussed weight loss      Obesity, morbid, BMI 50 or higher (HCC)    Weight loss is key to preventing type 2 DM; explained she is right there teetering on the cliff; she will work on losing weight; declined referral to nutrtionist; she hopes the metformin will help       Other Visit Diagnoses    Elevated hemoglobin A1c    -  Primary   Relevant Orders   POCT HgB A1C (Completed)   Burning with urination       Relevant Orders   POCT urinalysis dipstick (Completed)   Urine Culture   Cystitis       start macrobid; see AVS       Follow up plan: Return in about 3 months (around 01/05/2018) for follow-up visit with Dr. Sanda Klein.  An after-visit summary was printed and given to the patient at [facility].  Please see the patient instructions which may contain other information and recommendations beyond what is mentioned above in the assessment and plan.  Meds ordered this encounter  Medications  . nitrofurantoin, macrocrystal-monohydrate, (MACROBID) 100 MG capsule    Sig: Take 1 capsule (100 mg total) by mouth 2 (two) times daily.    Dispense:  6 capsule    Refill:  0    Orders Placed This Encounter  Procedures  . Urine Culture  . POCT HgB A1C  . POCT urinalysis dipstick

## 2017-10-05 NOTE — Patient Instructions (Addendum)
Keep up the great job with weight loss efforts Start the antibiotics Please do eat yogurt or kimchi or take a probiotic daily for the next month We want to replace the healthy germs in the gut If you notice foul, watery diarrhea in the next two months, schedule an appointment RIGHT AWAY or go to an urgent care or the emergency room if a holiday or over a weekend Prediabetes Eating Plan Elizabeth Hoffman called impaired glucose tolerance or impaired fasting glucose-is a condition that causes blood sugar (blood glucose) levels to be higher than normal. Following a healthy diet can help to keep prediabetes under control. It can also help to lower the risk of type 2 diabetes and heart disease, which are increased in people who have prediabetes. Along with regular exercise, a healthy diet:  Promotes weight loss.  Helps to control blood sugar levels.  Helps to improve the way that the body uses insulin.  What do I need to know about this eating plan?  Use the glycemic index (GI) to plan your meals. The index tells you how quickly a food will raise your blood sugar. Choose low-GI foods. These foods take a longer time to raise blood sugar.  Pay close attention to the amount of carbohydrates in the food that you eat. Carbohydrates increase blood sugar levels.  Keep track of how many calories you take in. Eating the right amount of calories will help you to achieve a healthy weight. Losing about 7 percent of your starting weight can help to prevent type 2 diabetes.  You may want to follow a Mediterranean diet. This diet includes a lot of vegetables, lean meats or fish, whole grains, fruits, and healthy oils and fats. What foods can I eat? Grains Whole grains, such as whole-wheat or whole-grain breads, crackers, cereals, and pasta. Unsweetened oatmeal. Bulgur. Barley. Quinoa. Brown rice. Corn or whole-wheat flour tortillas or taco shells. Vegetables Lettuce. Spinach. Peas. Beets. Cauliflower. Cabbage.  Broccoli. Carrots. Tomatoes. Squash. Eggplant. Herbs. Peppers. Onions. Cucumbers. Brussels sprouts. Fruits Berries. Bananas. Apples. Oranges. Grapes. Papaya. Mango. Pomegranate. Kiwi. Grapefruit. Cherries. Meats and Other Protein Sources Seafood. Lean meats, such as chicken and [place] or lean cuts of pork and beef. Tofu. Eggs. Nuts. Beans. Dairy Low-fat or fat-free dairy products, such as yogurt, cottage cheese, and cheese. Beverages Water. Tea. Coffee. Sugar-free or diet soda. Seltzer water. Milk. Milk alternatives, such as soy or almond milk. Condiments Mustard. Relish. Low-fat, low-sugar ketchup. Low-fat, low-sugar barbecue sauce. Low-fat or fat-free mayonnaise. Sweets and Desserts Sugar-free or low-fat pudding. Sugar-free or low-fat ice cream and other frozen treats. Fats and Oils Avocado. Walnuts. Olive oil. The items listed above may not be a complete list of recommended foods or beverages. Contact your dietitian for more options. What foods are not recommended? Grains Refined white flour and flour products, such as bread, pasta, snack foods, and cereals. Beverages Sweetened drinks, such as sweet iced tea and soda. Sweets and Desserts Baked goods, such as cake, cupcakes, pastries, cookies, and cheesecake. The items listed above may not be a complete list of foods and beverages to avoid. Contact your dietitian for more information. This information is not intended to replace advice given to you by your health care provider. Make sure you discuss any questions you have with your health care provider. Document Released: 09/15/2014 Document Revised: 10/07/2015 Document Reviewed: 05/27/2014 Elsevier Interactive Patient Education  2017 Elsevier Inc.  Preventing Type 2 Diabetes Mellitus Type 2 diabetes (type 2 diabetes mellitus) is a long-term (chronic) disease  that affects blood sugar (glucose) levels. Normally, a hormone called insulin allows glucose to enter cells in the body. The  cells use glucose for energy. In type 2 diabetes, one or both of these problems may be present:  The body does not make enough insulin.  The body does not respond properly to insulin that it makes (insulin resistance).  Insulin resistance or lack of insulin causes excess glucose to build up in the blood instead of going into cells. As a result, high blood glucose (hyperglycemia) develops, which can cause many complications. Being overweight or obese and having an inactive (sedentary) lifestyle can increase your risk for diabetes. Type 2 diabetes can be delayed or prevented by making certain nutrition and lifestyle changes. What nutrition changes can be made?  Eat healthy meals and snacks regularly. Keep a healthy snack with you for when you get hungry between meals, such as fruit or a handful of nuts.  Eat lean meats and proteins that are low in saturated fats, such as chicken, fish, egg whites, and beans. Avoid processed meats.  Eat plenty of fruits and vegetables and plenty of grains that have not been processed (whole grains). It is recommended that you eat: ? 1?2 cups of fruit every day. ? 2?3 cups of vegetables every day. ? 6?8 oz of whole grains every day, such as oats, whole wheat, bulgur, brown rice, quinoa, and millet.  Eat low-fat dairy products, such as milk, yogurt, and cheese.  Eat foods that contain healthy fats, such as nuts, avocado, olive oil, and canola oil.  Drink water throughout the day. Avoid drinks that contain added sugar, such as soda or sweet tea.  Follow instructions from your health care provider about specific eating or drinking restrictions.  Control how much food you eat at a time (portion size). ? Check food labels to find out the serving sizes of foods. ? Use a kitchen scale to weigh amounts of foods.  Saute or steam food instead of frying it. Cook with water or broth instead of oils or butter.  Limit your intake of: ? Salt (sodium). Have no more  than 1 tsp (2,400 mg) of sodium a day. If you have heart disease or high blood pressure, have less than ? tsp (1,500 mg) of sodium a day. ? Saturated fat. This is fat that is solid at room temperature, such as butter or fat on meat. What lifestyle changes can be made?  Activity  Do moderate-intensity physical activity for at least 30 minutes on at least 5 days of the week, or as much as told by your health care provider.  Ask your health care provider what activities are safe for you. A mix of physical activities may be best, such as walking, swimming, cycling, and strength training.  Try to add physical activity into your day. For example: ? Park in spots that are farther away than usual, so that you walk more. For example, park in a far corner of the parking lot when you go to the office or the grocery store. ? Take a walk during your lunch break. ? Use stairs instead of elevators or escalators. Weight Loss  Lose weight as directed. Your health care provider can determine how much weight loss is best for you and can help you lose weight safely.  If you are overweight or obese, you may be instructed to lose at least 5?7 % of your body weight. Alcohol and Tobacco   Limit alcohol intake to no more than  1 drink a day for nonpregnant women and 2 drinks a day for men. One drink equals 12 oz of beer, 5 oz of wine, or 1 oz of hard liquor.  Do not use any tobacco products, such as cigarettes, chewing tobacco, and e-cigarettes. If you need help quitting, ask your health care provider. Work With [facility] Provider  Have your blood glucose tested regularly, as told by your health care provider.  Discuss your risk factors and how you can reduce your risk for diabetes.  Get screening tests as told by your health care provider. You may have screening tests regularly, especially if you have certain risk factors for type 2 diabetes.  Make an appointment with a diet and nutrition  specialist (registered dietitian). A registered dietitian can help you make a healthy eating plan and can help you understand portion sizes and food labels. Why are these changes important?  It is possible to prevent or delay type 2 diabetes and related health problems by making lifestyle and nutrition changes.  It can be difficult to recognize signs of type 2 diabetes. The best way to avoid possible damage to your body is to take actions to prevent the disease before you develop symptoms. What can happen if changes are not made?  Your blood glucose levels may keep increasing. Having high blood glucose for a long time is dangerous. Too much glucose in your blood can damage your blood vessels, heart, kidneys, nerves, and eyes.  You may develop prediabetes or type 2 diabetes. Type 2 diabetes can lead to many chronic health problems and complications, such as: ? Heart disease. ? Stroke. ? Blindness. ? Kidney disease. ? Depression. ? Poor circulation in the feet and legs, which could lead to surgical removal (amputation) in severe cases. Where to find support:  Ask your health care provider to recommend a registered dietitian, diabetes educator, or weight loss program.  Look for local or online weight loss groups.  Join a gym, fitness club, or outdoor activity group, such as a walking club. Where to find more information: To learn more about diabetes and diabetes prevention, visit:  American Diabetes Association (ADA): www.diabetes.[employer] of Diabetes and Digestive and Kidney Diseases: FindSpin.nl  To learn more about healthy eating, visit:  The U.S. Department of Agriculture Scientist, research (physical sciences)), Choose My Plate: http://wiley-williams.com/  Office of Disease Prevention and Health Promotion (ODPHP), Dietary Guidelines: SurferLive.at  Summary  You can reduce your risk for type 2 diabetes by increasing your physical  activity, eating healthy foods, and losing weight as directed.  Talk with your health care provider about your risk for type 2 diabetes. Ask about any blood tests or screening tests that you need to have. This information is not intended to replace advice given to you by your health care provider. Make sure you discuss any questions you have with your health care provider. Document Released: 08/23/2015 Document Revised: 10/07/2015 Document Reviewed: 06/22/2015 Elsevier Interactive Patient Education  [employer].

## 2017-10-05 NOTE — Assessment & Plan Note (Signed)
Weight loss is key to preventing type 2 DM; explained she is right there teetering on the cliff; she will work on losing weight; declined referral to nutrtionist; she hopes the metformin will help

## 2017-10-05 NOTE — Assessment & Plan Note (Signed)
Improved on the higher dose of CCB

## 2017-10-05 NOTE — Assessment & Plan Note (Signed)
Repeat A1c is only 6.4%; will treat as PREdiabetes; continue the metformin; discussed weight loss

## 2017-10-08 ENCOUNTER — Other Ambulatory Visit: Payer: Self-pay | Admitting: Family Medicine

## 2017-10-08 LAB — URINE CULTURE
MICRO NUMBER:: 90636814
SPECIMEN QUALITY:: ADEQUATE

## 2017-10-08 MED ORDER — SULFAMETHOXAZOLE-TRIMETHOPRIM 800-160 MG PO TABS: 1.0000 | ORAL_TABLET | Freq: Two times a day (BID) | ORAL | 0 refills | Status: DC

## 2017-10-08 NOTE — Progress Notes (Signed)
New Rx for abx

## 2017-10-14 NOTE — Progress Notes (Signed)
10/15/2017 2:54 PM   Elizabeth Hoffman 1971/04/24 [id number]  Referring provider: Arnetha Courser, MD [address] [place], [place] [address]  Chief Complaint  Patient presents with  . Urinary Frequency    New Patient    HPI: Patient is a 47 year old Caucasian female referred by Dr. Enid Hoffman for urinary frequency, dysuria and lower abdominal pain who presents with her 6 year daughter, Elizabeth Hoffman.     She was seen on 10/05/2017 by Dr. Sanda Klein and was complaining of dysuria.  Her urine culture was positive for E. Coli that was pan sensitive.    She is having urinary frequency x "can't even tell", strong urgency, nocturia x 3-4 and urge incontinence for months.  She is wearing POISE pads, two in the day and three at night.  She denies dysuria, gross hematuria, suprapubic pain, back pain, abdominal pain or flank pain.  She has not had any recent fevers, chills, nausea or vomiting.  Her UA is with moderate bacteria.  He PVR is 5 mL.    She had a remote history of stone.  She passed it spontaneously.  She does not have a history of GU surgery or GU trauma.   She had a cystoscopy during her hysterectomy and brisk bilateral ureteral jets and intact bladder integrity were seen in 05/2017.     She denies constipation and/or diarrhea.   She is drinking two bottles of water daily.   Once in a blue moon, sweat tea.  No sodas.  No juices.  No alcohol.    Reviewed referral notes.    PMH: Past Medical History:  Diagnosis Date  . Anxiety   . Arthritis   . Breast cancer ([place]) 2016  . Breast cancer ([place])   . Depression   . Hot flashes   . Hypertension   . OSA on CPAP 11/03/2016    Surgical History: Past Surgical History:  Procedure Laterality Date  . ABDOMINAL HYSTERECTOMY  06/15/2017  . BREAST EXCISIONAL BIOPSY Left 03/18/2015   breast ca +  . BREAST LUMPECTOMY WITH RADIOACTIVE SEED LOCALIZATION Left 03/18/2015   Procedure: BREAST LUMPECTOMY WITH RADIOACTIVE SEED  LOCALIZATION;  Surgeon: Erroll Luna, MD;  Location: [facility];  Service: General;  Laterality: Left;  . CERVICAL BIOPSY  W/ LOOP ELECTRODE EXCISION    . CHOLECYSTECTOMY    . HERNIA REPAIR    . MASTECTOMY, PARTIAL Left 02/15/2015  . NASAL SEPTUM SURGERY    . TONSILLECTOMY AND ADENOIDECTOMY    . UVULECTOMY      Home Medications:  Allergies as of 10/15/2017   No Known Allergies     Medication List        Accurate as of 10/15/17  2:54 PM. Always use your most recent med list.          amLODipine 10 MG tablet Commonly known as:  NORVASC Take 1 tablet (10 mg total) by mouth daily.   hydrochlorothiazide 25 MG tablet Commonly known as:  HYDRODIURIL TAKE 1 TABLET BY MOUTH DAILY.   metFORMIN 500 MG 24 hr tablet Commonly known as:  GLUCOPHAGE-XR Take 1 tablet (500 mg total) by mouth daily with breakfast. For 3 days, then two pills daily for 3 days, then 3 pills daily   omeprazole 20 MG capsule Commonly known as:  PRILOSEC Take 20 mg by mouth daily.   sertraline 100 MG tablet Commonly known as:  ZOLOFT Take 1 tablet (100 mg total) by mouth daily.   sulfamethoxazole-trimethoprim  800-160 MG tablet Commonly known as:  BACTRIM DS Take 1 tablet by mouth 2 (two) times daily.   tamoxifen 20 MG tablet Commonly known as:  NOLVADEX Take 1 tablet (20 mg total) by mouth daily.   VIVELLE-DOT 0.1 MG/24HR patch Generic drug:  estradiol Place 1 patch onto the skin 2 (two) times a week.       Allergies: No Known Allergies  Family History: Family History  Adopted: Yes  Family history unknown: Yes    Social History:  reports that she has never smoked. She has never used smokeless tobacco. She reports that she does not drink alcohol or use drugs.  ROS: UROLOGY Frequent Urination?: Yes Hard to postpone urination?: Yes Burning/pain with urination?: No Get up at night to urinate?: Yes Leakage of urine?: Yes Urine stream starts and stops?: No Trouble starting  stream?: No Do you have to strain to urinate?: No Blood in urine?: No Urinary tract infection?: No Sexually transmitted disease?: No Injury to kidneys or bladder?: No Painful intercourse?: No Weak stream?: No Currently pregnant?: No Vaginal bleeding?: No Last menstrual period?: hysterectomy  Gastrointestinal Nausea?: No Vomiting?: No Indigestion/heartburn?: Yes Diarrhea?: No Constipation?: No  Constitutional Fever: No Night sweats?: Yes Weight loss?: No Fatigue?: No  Skin Skin rash/lesions?: No Itching?: No  Eyes Blurred vision?: No Double vision?: No  Ears/Nose/Throat Sore throat?: No Sinus problems?: No  Hematologic/Lymphatic Swollen glands?: No Easy bruising?: No  Cardiovascular Leg swelling?: No Chest pain?: No  Respiratory Cough?: No Shortness of breath?: No  Endocrine Excessive thirst?: No  Musculoskeletal Back pain?: No Joint pain?: No  Neurological Headaches?: No Dizziness?: No  Psychologic Depression?: No Anxiety?: Yes  Physical Exam: BP 135/77   Pulse 85   Ht 5' 9"  (1.753 m)   Wt (!) 370 lb (167.8 kg)   LMP 02/18/2015 (Approximate)   BMI 54.64 kg/m   Constitutional:  Well nourished. Alert and oriented, No acute distress. HEENT: [place] AT, moist mucus membranes.  Trachea midline, no masses. Cardiovascular: No clubbing, cyanosis, or edema. Respiratory: Normal respiratory effort, no increased work of breathing. GI: Abdomen is soft, non tender, non distended, no abdominal masses. Liver and spleen not palpable.  No hernias appreciated.  Stool sample for occult testing is not indicated.   GU: No CVA tenderness.  No bladder fullness or masses.  Normal external genitalia, normal pubic hair distribution, no lesions.  Normal urethral meatus, no lesions, no prolapse, no discharge.   No urethral masses, tenderness and/or tenderness. No bladder fullness, tenderness or masses. Normal vagina mucosa, good estrogen effect, no discharge, no lesions,  good pelvic support, no cystocele or rectocele noted.  Cervix, uterus and adnexa are surgically absent.  Anus and perineum are without rashes or lesions.    Skin: No rashes, bruises or suspicious lesions. Lymph: No cervical or inguinal adenopathy. Neurologic: Grossly intact, no focal deficits, moving all 4 extremities. Psychiatric: Normal mood and affect.  Laboratory Data: Lab Results  Component Value Date   WBC 6.8 11/30/2015   HGB 12.3 11/30/2015   HCT 38.1 11/30/2015   MCV 84.5 11/30/2015   PLT 236 11/30/2015    Lab Results  Component Value Date   CREATININE 0.98 02/16/2017    No results found for: PSA  No results found for: TESTOSTERONE  Lab Results  Component Value Date   HGBA1C 6.4 (A) 10/05/2017    Lab Results  Component Value Date   TSH 4.60 (H) 11/30/2015       Component Value Date/Time  CHOL 160 02/16/2017 0938   CHOL 202 (H) 02/04/2015 0905   HDL 54 02/16/2017 0938   HDL 55 02/04/2015 0905   CHOLHDL 3.0 02/16/2017 0938   VLDL 33 (H) 11/30/2015 1123   LDLCALC 81 02/16/2017 0938    Lab Results  Component Value Date   AST 21 02/16/2017   Lab Results  Component Value Date   ALT 21 02/16/2017   No components found for: ALKALINEPHOPHATASE No components found for: BILIRUBINTOTAL  No results found for: ESTRADIOL  Urinalysis Moderate bacteria.  See epic.  I have reviewed the labs.   Pertinent Imaging: Results for DEVANEE, POMPLUN (MRN [id number]) as of 10/15/2017 14:53  Ref. Range 10/15/2017 14:11  Scan Result Unknown 61m     Assessment & Plan:    1. Urgency Discussed behavioral therapies, bladder training, bladder control strategies and pelvic floor muscle training - patient deferred until after the summer Discussed fluid management Offered medical therapy with anticholinergic therapy or beta-3 adrenergic receptor agonist and the potential side effects of each therapy - would like to try the beta-3 adrenergic receptor agonist (Myrbetriq).   Given Myrbetriq 25 mg samples, #28.  I have reviewed with the patient of the side effects of Myrbetriq, such as: elevation in BP, urinary retention and/or HA.   RTC in 3 weeks for PVR and symptom recheck   2. Incontinence See above  3. Nocturia Will reassess when she returns, hopefully will have improvement with the Myrbetriq  Return in about 3 weeks (around 11/05/2017) for PVR and OAB questionnaire.  These notes generated with voice recognition software. I apologize for typographical errors.  SZara Council PA-C  [facility] [address] [place]  [phone]

## 2017-10-15 ENCOUNTER — Encounter: Payer: Self-pay | Admitting: Urology

## 2017-10-15 ENCOUNTER — Ambulatory Visit (INDEPENDENT_AMBULATORY_CARE_PROVIDER_SITE_OTHER): Payer: Medicaid Other | Admitting: Urology

## 2017-10-15 ENCOUNTER — Encounter

## 2017-10-15 VITALS — BP 135/77 | HR 85 | Ht 69.0 in | Wt 370.0 lb

## 2017-10-15 DIAGNOSIS — N3946 Mixed incontinence: Secondary | ICD-10-CM | POA: Diagnosis not present

## 2017-10-15 DIAGNOSIS — R300 Dysuria: Secondary | ICD-10-CM

## 2017-10-15 DIAGNOSIS — R351 Nocturia: Secondary | ICD-10-CM | POA: Diagnosis not present

## 2017-10-15 DIAGNOSIS — R3915 Urgency of urination: Secondary | ICD-10-CM | POA: Diagnosis not present

## 2017-10-15 LAB — URINALYSIS, COMPLETE
Bilirubin, UA: NEGATIVE
GLUCOSE, UA: NEGATIVE
Ketones, UA: NEGATIVE
Leukocytes, UA: NEGATIVE
NITRITE UA: NEGATIVE
Protein, UA: NEGATIVE
RBC, UA: NEGATIVE
Specific Gravity, UA: 1.025 (ref 1.005–1.030)
UUROB: 0.2 mg/dL (ref 0.2–1.0)
pH, UA: 5.5 (ref 5.0–7.5)

## 2017-10-15 LAB — MICROSCOPIC EXAMINATION
RBC MICROSCOPIC, UA: NONE SEEN /HPF (ref 0–2)
WBC UA: NONE SEEN /HPF (ref 0–5)

## 2017-10-15 LAB — BLADDER SCAN AMB NON-IMAGING

## 2017-10-15 NOTE — Patient Instructions (Signed)
Mirabegron extended-release tablets What is this medicine? MIRABEGRON (MIR a BEG ron) is used to treat overactive bladder. This medicine reduces the amount of bathroom visits. It may also help to control wetting accidents. This medicine may be used for other purposes; ask your health care provider or pharmacist if you have questions. COMMON BRAND NAME(S): Myrbetriq What should I tell my health care provider before I take this medicine? They need to know if you have any of these conditions: -difficulty passing urine -high blood pressure -kidney disease -liver disease -an unusual or allergic reaction to mirabegron, other medicines, foods, dyes, or preservatives -pregnant or trying to get pregnant -breast-feeding How should I use this medicine? Take this medicine by mouth with a glass of water. Follow the directions on the prescription label. Do not cut, crush or chew this medicine. You can take it with or without food. If it upsets your stomach, take it with food. Take your medicine at regular intervals. Do not take it more often than directed. Do not stop taking except on your doctor's advice. Talk to your pediatrician regarding the use of this medicine in children. Special care may be needed. Overdosage: If you think you have taken too much of this medicine contact a poison control center or emergency room at once. NOTE: This medicine is only for you. Do not share this medicine with others. What if I miss a dose? If you miss a dose, take it as soon as you can. If it is almost time for your next dose, take only that dose. Do not take double or extra doses. What may interact with this medicine? -certain medicines for bladder problems like fesoterodine, oxybutynin, solifenacin, tolterodine -desipramine -digoxin -flecainide -ketoconazole -MAOIs like Carbex, Eldepryl, Marplan, Nardil, and Parnate -metoprolol -propafenone -thioridazine -warfarin This list may not describe all possible  interactions. Give your health care provider a list of all the medicines, herbs, non-prescription drugs, or dietary supplements you use. Also tell them if you smoke, drink alcohol, or use illegal drugs. Some items may interact with your medicine. What should I watch for while using this medicine? It may take 8 weeks to notice the full benefit from this medicine. You may need to limit your intake tea, coffee, caffeinated sodas, and alcohol. These drinks may make your symptoms worse. Visit your doctor or health care professional for regular checks on your progress. Check your blood pressure as directed. Ask your doctor or health care professional what your blood pressure should be and when you should contact him or her. What side effects may I notice from receiving this medicine? Side effects that you should report to your doctor or health care professional as soon as possible: -allergic reactions like skin rash, itching or hives, swelling of the face, lips, or tongue -chest pain or palpitations -severe or sudden headache -high blood pressure -fast, irregular heartbeat -redness, blistering, peeling or loosening of the skin, including inside the mouth -signs of infection like fever or chills; cough; sore throat; pain or difficulty passing urine -trouble passing urine or change in the amount of urine Side effects that usually do not require medical attention (report to your doctor or health care professional if they continue or are bothersome): -constipation -diarrhea -dizziness -dry eyes -joint pain -mild headache -nausea -runny nose This list may not describe all possible side effects. Call your doctor for medical advice about side effects. You may report side effects to FDA at 1-800-FDA-1088. Where should I keep my medicine? Keep out of the reach  of children. Store at room temperature between 15 and 30 degrees C (59 and 86 degrees F). Throw away any unused medicine after the expiration  date. NOTE: This sheet is a summary. It may not cover all possible information. If you have questions about this medicine, talk to your doctor, pharmacist, or health care provider.  2018 Elsevier/Gold Standard (2015-06-03 12:14:30)

## 2017-10-18 ENCOUNTER — Encounter: Payer: Self-pay | Admitting: Family Medicine

## 2017-10-18 DIAGNOSIS — M79673 Pain in unspecified foot: Secondary | ICD-10-CM

## 2017-10-19 ENCOUNTER — Encounter: Payer: Self-pay | Admitting: Oncology

## 2017-10-19 ENCOUNTER — Inpatient Hospital Stay: Payer: Medicaid Other | Attending: Oncology | Admitting: Oncology

## 2017-10-19 VITALS — BP 107/72 | HR 76 | Temp 97.7°F | Resp 18 | Ht 69.0 in | Wt 368.8 lb

## 2017-10-19 DIAGNOSIS — Z170 Estrogen receptor positive status [ER+]: Secondary | ICD-10-CM

## 2017-10-19 DIAGNOSIS — Z923 Personal history of irradiation: Secondary | ICD-10-CM | POA: Insufficient documentation

## 2017-10-19 DIAGNOSIS — Z7984 Long term (current) use of oral hypoglycemic drugs: Secondary | ICD-10-CM | POA: Insufficient documentation

## 2017-10-19 DIAGNOSIS — G4730 Sleep apnea, unspecified: Secondary | ICD-10-CM | POA: Diagnosis not present

## 2017-10-19 DIAGNOSIS — I10 Essential (primary) hypertension: Secondary | ICD-10-CM | POA: Diagnosis not present

## 2017-10-19 DIAGNOSIS — D0512 Intraductal carcinoma in situ of left breast: Secondary | ICD-10-CM

## 2017-10-19 DIAGNOSIS — Z79810 Long term (current) use of selective estrogen receptor modulators (SERMs): Secondary | ICD-10-CM

## 2017-10-19 DIAGNOSIS — Z79899 Other long term (current) drug therapy: Secondary | ICD-10-CM | POA: Insufficient documentation

## 2017-10-19 DIAGNOSIS — Z853 Personal history of malignant neoplasm of breast: Secondary | ICD-10-CM

## 2017-10-19 DIAGNOSIS — F418 Other specified anxiety disorders: Secondary | ICD-10-CM | POA: Insufficient documentation

## 2017-10-19 DIAGNOSIS — R232 Flushing: Secondary | ICD-10-CM | POA: Insufficient documentation

## 2017-10-19 DIAGNOSIS — Z90710 Acquired absence of both cervix and uterus: Secondary | ICD-10-CM | POA: Diagnosis not present

## 2017-10-19 DIAGNOSIS — Z08 Encounter for follow-up examination after completed treatment for malignant neoplasm: Secondary | ICD-10-CM

## 2017-10-19 DIAGNOSIS — C50412 Malignant neoplasm of upper-outer quadrant of left female breast: Secondary | ICD-10-CM

## 2017-10-19 MED ORDER — ANASTROZOLE 1 MG PO TABS: 1.0000 mg | ORAL_TABLET | Freq: Every day | ORAL | 3 refills | Status: DC

## 2017-10-19 NOTE — Addendum Note (Signed)
Addended by: Luella Cook on: 10/19/2017 03:45 PM   Modules accepted: Orders

## 2017-10-19 NOTE — Progress Notes (Signed)
Hematology/Oncology Consult note [facility]  [phone] [phone]  Patient Care Team: Arnetha Courser, MD as PCP - General (Family Medicine) Erroll Luna, MD as Consulting Physician (General Surgery) Magrinat, Virgie Dad, MD as Consulting Physician (Oncology) Gery Pray, MD as Consulting Physician (Radiation Oncology) Rockwell Germany, RN as Registered Nurse Mauro Kaufmann, RN as Registered Nurse Will Bonnet, MD as Attending Physician (Obstetrics and Gynecology) Sylvan Cheese, NP as Nurse Practitioner (Hematology and Oncology)   Name of the patient: Elizabeth Hoffman  [id number]  [birth year]   Date of visit: 10/19/17  Diagnosis- h/o left breast DCIS    Chief complaint/ Reason for visit-routine follow-up of DCIS on tamoxifen  Heme/Onc history: patient is a 47 year old premenopausal female who was diagnosed with left breast DCIS ER positive, 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 She underwent lumpectomy and completed adjuvant radiation in January 2017. She has been on tamoxifen since February 2017. Patient  had Mirena IUD in place for her menorrhagia which did not help her significantly.  She subsequently underwent hysterectomy in February 2019.  Patient also underwent genetic testing and was not found to have any genetic mutations.recent mammogram and USG from feb 2018 revealed no malignancy.     Interval history-patient reports hot flashes since she is had hysterectomy.  They are bothersome and patient is not currently on any medications for the same.  Patient was started on estradiol patch by GYN following her hysterectomy  ECOG PS- 0 Pain scale- 0   Review of systems- Review of Systems  Constitutional: Negative for chills, fever, malaise/fatigue and weight loss.  HENT: Negative for congestion, ear discharge and nosebleeds.   Eyes: Negative for blurred vision.  Respiratory: Negative for cough, hemoptysis,  sputum production, shortness of breath and wheezing.   Cardiovascular: Negative for chest pain, palpitations, orthopnea and claudication.  Gastrointestinal: Negative for abdominal pain, blood in stool, constipation, diarrhea, heartburn, melena, nausea and vomiting.  Genitourinary: Negative for dysuria, flank pain, frequency, hematuria and urgency.  Musculoskeletal: Negative for back pain, joint pain and myalgias.  Skin: Negative for rash.  Neurological: Negative for dizziness, tingling, focal weakness, seizures, weakness and headaches.  Endo/Heme/Allergies: Does not bruise/bleed easily.       Hot flashes +  Psychiatric/Behavioral: Negative for depression and suicidal ideas. The patient does not have insomnia.       No Known Allergies   Past Medical History:  Diagnosis Date  . Anxiety   . Arthritis   . Breast cancer ([place]) 2016  . Breast cancer ([place])   . Depression   . Hot flashes   . Hypertension   . OSA on CPAP 11/03/2016     Past Surgical History:  Procedure Laterality Date  . ABDOMINAL HYSTERECTOMY  06/15/2017  . BREAST EXCISIONAL BIOPSY Left 03/18/2015   breast ca +  . BREAST LUMPECTOMY WITH RADIOACTIVE SEED LOCALIZATION Left 03/18/2015   Procedure: BREAST LUMPECTOMY WITH RADIOACTIVE SEED LOCALIZATION;  Surgeon: Erroll Luna, MD;  Location: [facility];  Service: General;  Laterality: Left;  . CERVICAL BIOPSY  W/ LOOP ELECTRODE EXCISION    . CHOLECYSTECTOMY    . HERNIA REPAIR    . MASTECTOMY, PARTIAL Left 02/15/2015  . NASAL SEPTUM SURGERY    . TONSILLECTOMY AND ADENOIDECTOMY    . UVULECTOMY      Social History   Socioeconomic History  . Marital status: Married    Spouse name: Not on file  . Number of children: 3  .  Years of education: Not on file  . Highest education level: Not on file  Occupational History  . Not on file  Social Needs  . Financial resource strain: Not on file  . Food insecurity:    Worry: Not on file    Inability: Not on  file  . Transportation needs:    Medical: Not on file    Non-medical: Not on file  Tobacco Use  . Smoking status: Never Smoker  . Smokeless tobacco: Never Used  Substance and Sexual Activity  . Alcohol use: No    Alcohol/week: 0.0 oz  . Drug use: No  . Sexual activity: Yes    Partners: Male    Birth control/protection: IUD  Lifestyle  . Physical activity:    Days per week: Not on file    Minutes per session: Not on file  . Stress: Not on file  Relationships  . Social connections:    Talks on phone: Not on file    Gets together: Not on file    Attends religious service: Not on file    Active member of club or organization: Not on file    Attends meetings of clubs or organizations: Not on file    Relationship status: Not on file  . Intimate partner violence:    Fear of current or ex partner: Not on file    Emotionally abused: Not on file    Physically abused: Not on file    Forced sexual activity: Not on file  Other Topics Concern  . Not on file  Social History Narrative  . Not on file    Family History  Adopted: Yes  Family history unknown: Yes     Current Outpatient Medications:  .  amLODipine (NORVASC) 10 MG tablet, Take 1 tablet (10 mg total) by mouth daily., Disp: 90 tablet, Rfl: 3 .  estradiol (VIVELLE-DOT) 0.1 MG/24HR patch, Place 1 patch onto the skin 2 (two) times a week., Disp: , Rfl:  .  hydrochlorothiazide (HYDRODIURIL) 25 MG tablet, TAKE 1 TABLET BY MOUTH DAILY., Disp: 90 tablet, Rfl: 0 .  metFORMIN (GLUCOPHAGE-XR) 500 MG 24 hr tablet, Take 1 tablet (500 mg total) by mouth daily with breakfast. For 3 days, then two pills daily for 3 days, then 3 pills daily, Disp: 81 tablet, Rfl: 0 .  omeprazole (PRILOSEC) 20 MG capsule, Take 20 mg by mouth daily., Disp: , Rfl:  .  sertraline (ZOLOFT) 100 MG tablet, Take 1 tablet (100 mg total) by mouth daily., Disp: 90 tablet, Rfl: 3 .  tamoxifen (NOLVADEX) 20 MG tablet, Take 1 tablet (20 mg total) by mouth daily., Disp:  90 tablet, Rfl: 3 .  sulfamethoxazole-trimethoprim (BACTRIM DS) 800-160 MG tablet, Take 1 tablet by mouth 2 (two) times daily. (Patient not taking: Reported on 10/19/2017), Disp: 6 tablet, Rfl: 0  Physical exam:  Vitals:   10/19/17 0943  BP: 107/72  Pulse: 76  Resp: 18  Temp: 97.7 F (36.5 C)  SpO2: 95%  Weight: (!) 368 lb 12.8 oz (167.3 kg)  Height: 5' 9"  (1.753 m)   Physical Exam  Constitutional: She is oriented to person, place, and time.  Patient is obese.  Does not appear to be in any acute distress  HENT:  Head: Normocephalic and atraumatic.  Eyes: Pupils are equal, round, and reactive to light. EOM are normal.  Neck: Normal range of motion.  Cardiovascular: Normal rate, regular rhythm and normal heart sounds.  Pulmonary/Chest: Effort normal and breath sounds normal.  Abdominal: Soft. Bowel sounds are normal.  Neurological: She is alert and oriented to person, place, and time.  Skin: Skin is warm and dry.   Breast exam was performed in seated and lying down position. Patient is status post left lumpectomy with a well-healed surgical scar. No evidence of any palpable masses. No evidence of axillary adenopathy. No evidence of any palpable masses or lumps in the right breast. No evidence of right axillary adenopathy   CMP Latest Ref Rng & Units 02/16/2017  Glucose 65 - 99 mg/dL 98  BUN 7 - 25 mg/dL 17  Creatinine 0.50 - 1.10 mg/dL 0.98  Sodium 135 - 146 mmol/L 140  Potassium 3.5 - 5.3 mmol/L 4.3  Chloride 98 - 110 mmol/L 105  CO2 20 - 32 mmol/L 26  Calcium 8.6 - 10.2 mg/dL 9.2  Total Protein 6.1 - 8.1 g/dL 6.6  Total Bilirubin 0.2 - 1.2 mg/dL 0.4  Alkaline Phos 33 - 115 U/L -  AST 10 - 35 U/L 21  ALT 6 - 29 U/L 21   CBC Latest Ref Rng & Units 11/30/2015  WBC 3.8 - 10.8 K/uL 6.8  Hemoglobin 11.7 - 15.5 g/dL 12.3  Hematocrit 35.0 - 45.0 % 38.1  Platelets 140 - 400 K/uL 236      Assessment and plan- Patient is a 47 y.o. female with left breast DCIS currently on  tamoxifen  Patient started taking tamoxifen in 2017 and needs to take it for 5 years ending in 2022.  Patient recently underwent hysterectomy and bilateral salpingo-oophorectomy in February 2019.  She is therefore postmenopausal and I discussed the option of switching to Arimidex at this time given that AI's were shown to be better than tamoxifen in DCIS in women less than 53 years of age in terms of progression free survival but no difference in overall survival.  Patient is willing to try Arimidex and understands that if she were to have any worsening side effect such as worsening hot flashes, arthralgias or fatigue she can always go back to tamoxifen.  She will also need to take calcium and vitamin D while on Arimidex.  We will obtain a baseline bone density scan at this time.  We have given her written information on Arimidex.  She will stop taking her tamoxifen at this time  Hot flashes: Discussed that gabapentin and citalopram or potential medications that could be considered for hot flashes.  She would like to think about it and discuss more with Dr. Sanda Klein  I have also asked her to stop using estradiol patches given her history of DCIS.  I have asked her to let her GYN know about this.  Although estradiol patches can help her with her menopausal symptoms the risk of breast cancer is higher in women who are on estrogen therapy with a prior history of breast cancer and I would not recommend her to continue that  I will see her back in 6 months.  We will check an interim CMP to monitor her liver function tests since she is going to start on Arimidex  We will schedule her diagnostic bilateral mammogram for 2020 February    Visit Diagnosis 1. Malignant neoplasm of upper-outer quadrant of left breast in female, estrogen receptor positive ([place])   2. Long-term current use of tamoxifen   3. Encounter for follow-up surveillance of breast cancer      Dr. Randa Evens, MD, MPH [facility] [phone] 10/19/2017 2:55 PM

## 2017-10-19 NOTE — Progress Notes (Signed)
No new changes noted today 

## 2017-10-22 ENCOUNTER — Encounter: Payer: Self-pay | Admitting: Urology

## 2017-10-29 ENCOUNTER — Other Ambulatory Visit: Payer: Self-pay

## 2017-10-29 ENCOUNTER — Encounter: Payer: Self-pay | Admitting: Family Medicine

## 2017-10-29 ENCOUNTER — Other Ambulatory Visit: Payer: Self-pay | Admitting: Family Medicine

## 2017-10-29 MED ORDER — METFORMIN HCL ER 500 MG PO TB24: 1500.0000 mg | ORAL_TABLET | Freq: Every day | ORAL | 5 refills | Status: DC

## 2017-10-29 NOTE — Telephone Encounter (Signed)
Refill request for diabetic medication:   Metformin 500 mg  Last office visit pertaining to diabetes: 10/05/2017   Lab Results  Component Value Date   HGBA1C 6.4 (A) 10/05/2017     Follow-ups on file. 01/21/2018

## 2017-10-29 NOTE — Telephone Encounter (Signed)
Patient sent me a refill request through MyChart Already responded to; thank you

## 2017-11-02 ENCOUNTER — Encounter: Payer: Self-pay | Admitting: Urology

## 2017-11-10 NOTE — Progress Notes (Signed)
11/12/2017 11:21 AM   Elizabeth Hoffman 11-15-1970 [id number]  Referring provider: Arnetha Courser, MD [address] [place], [place] [address]  Chief Complaint  Patient presents with  . Follow-up    3wk    HPI: Patient is a 47 year old Caucasian female with a history of urgency, incontinence and nocturia who was placed on Myrbetriq 25 mg daily and returns for follow-up.  Background history Patient is a 47 year old Caucasian female referred by Dr. Enid Derry for urinary frequency, dysuria and lower abdominal pain who presents with her 6 year daughter, Elizabeth Hoffman.   She was seen on 10/05/2017 by Dr. Sanda Klein and was complaining of dysuria.  Her urine culture was positive for E. Coli that was pan sensitive.   She is having urinary frequency x "can't even tell", strong urgency, nocturia x 3-4 and urge incontinence for months.  She is wearing POISE pads, two in the day and three at night.  She denies dysuria, gross hematuria, suprapubic pain, back pain, abdominal pain or flank pain.  She has not had any recent fevers, chills, nausea or vomiting.  Her UA is with moderate bacteria.  Her PVR is 5 mL.  She had a remote history of stone.  She passed it spontaneously.  She does not have a history of GU surgery or GU trauma.   She had a cystoscopy during her hysterectomy and brisk bilateral ureteral jets and intact bladder integrity were seen in 05/2017.  She denies constipation and/or diarrhea.  She is drinking two bottles of water daily.   Once in a blue moon, sweat tea.  No sodas.  No juices.  No alcohol.    The patient has been experiencing urgency x 0-3 (improved), frequency x 8 or more (improved), is restricting fluids to avoid visits to the restroom, is engaging in toilet mapping, incontinence x 0-3 (improved) and nocturia x 0-3 (improved).  BP is 139/85.   Her PVR is 5 mL.   Patient denies any gross hematuria, dysuria or suprapubic/flank pain.  Patient denies any fevers, chills, nausea or  vomiting.  She has found the Myrbetriq 25 mg daily very effective in achieving her goals.   She is also no longer wearing pads.    PMH: Past Medical History:  Diagnosis Date  . Anxiety   . Arthritis   . Breast cancer ([place]) 2016  . Breast cancer ([place])   . Depression   . Hot flashes   . Hypertension   . OSA on CPAP 11/03/2016    Surgical History: Past Surgical History:  Procedure Laterality Date  . ABDOMINAL HYSTERECTOMY  06/15/2017  . BREAST EXCISIONAL BIOPSY Left 03/18/2015   breast ca +  . BREAST LUMPECTOMY WITH RADIOACTIVE SEED LOCALIZATION Left 03/18/2015   Procedure: BREAST LUMPECTOMY WITH RADIOACTIVE SEED LOCALIZATION;  Surgeon: Erroll Luna, MD;  Location: [facility];  Service: General;  Laterality: Left;  . CERVICAL BIOPSY  W/ LOOP ELECTRODE EXCISION    . CHOLECYSTECTOMY    . HERNIA REPAIR    . MASTECTOMY, PARTIAL Left 02/15/2015  . NASAL SEPTUM SURGERY    . TONSILLECTOMY AND ADENOIDECTOMY    . UVULECTOMY      Home Medications:  Allergies as of 11/12/2017   No Known Allergies     Medication List        Accurate as of 11/12/17 11:21 AM. Always use your most recent med list.          amLODipine 10 MG tablet Commonly known  as:  NORVASC Take 1 tablet (10 mg total) by mouth daily.   anastrozole 1 MG tablet Commonly known as:  ARIMIDEX Take 1 tablet (1 mg total) by mouth daily.   hydrochlorothiazide 25 MG tablet Commonly known as:  HYDRODIURIL TAKE 1 TABLET BY MOUTH DAILY.   metFORMIN 500 MG 24 hr tablet Commonly known as:  GLUCOPHAGE-XR Take 3 tablets (1,500 mg total) by mouth daily.   mirabegron ER 25 MG Tb24 tablet Commonly known as:  MYRBETRIQ Take 1 tablet (25 mg total) by mouth daily.   mupirocin ointment 2 % Commonly known as:  BACTROBAN Apply topically.   omeprazole 20 MG capsule Commonly known as:  PRILOSEC Take 20 mg by mouth daily.   sertraline 100 MG tablet Commonly known as:  ZOLOFT Take 1 tablet (100 mg total) by  mouth daily.   sulfamethoxazole-trimethoprim 800-160 MG tablet Commonly known as:  BACTRIM DS Take 1 tablet by mouth 2 (two) times daily.   tamoxifen 20 MG tablet Commonly known as:  NOLVADEX Take 1 tablet (20 mg total) by mouth daily.   VIVELLE-DOT 0.1 MG/24HR patch Generic drug:  estradiol Place 1 patch onto the skin 2 (two) times a week.       Allergies: No Known Allergies  Family History: Family History  Adopted: Yes  Family history unknown: Yes    Social History:  reports that she has never smoked. She has never used smokeless tobacco. She reports that she does not drink alcohol or use drugs.  ROS: UROLOGY Frequent Urination?: No Hard to postpone urination?: No Burning/pain with urination?: No Get up at night to urinate?: No Leakage of urine?: No Urine stream starts and stops?: No Trouble starting stream?: No Do you have to strain to urinate?: No Blood in urine?: No Urinary tract infection?: No Sexually transmitted disease?: No Injury to kidneys or bladder?: No Painful intercourse?: No Weak stream?: No Currently pregnant?: No Vaginal bleeding?: No Last menstrual period?: Hysterectomy  Gastrointestinal Nausea?: No Vomiting?: No Indigestion/heartburn?: Yes Diarrhea?: No Constipation?: No  Constitutional Fever: No Night sweats?: Yes Weight loss?: No Fatigue?: No  Skin Skin rash/lesions?: No Itching?: No  Eyes Blurred vision?: No Double vision?: No  Ears/Nose/Throat Sore throat?: No Sinus problems?: No  Hematologic/Lymphatic Swollen glands?: No Easy bruising?: No  Cardiovascular Leg swelling?: No Chest pain?: No  Respiratory Cough?: No Shortness of breath?: No  Endocrine Excessive thirst?: No  Musculoskeletal Back pain?: No Joint pain?: No  Neurological Headaches?: No Dizziness?: No  Psychologic Depression?: No Anxiety?: No  Physical Exam: BP 139/85 (BP Location: Left Arm, Patient Position: Sitting, Cuff Size: Large)    Pulse 70   Ht 5' 9"  (1.753 m)   Wt (!) 368 lb (166.9 kg)   LMP 02/18/2015 (Approximate)   BMI 54.34 kg/m   Constitutional: Well nourished. Alert and oriented, No acute distress. HEENT: [place] AT, moist mucus membranes. Trachea midline, no masses. Cardiovascular: No clubbing, cyanosis, or edema. Respiratory: Normal respiratory effort, no increased work of breathing. Skin: No rashes, bruises or suspicious lesions. Lymph: No cervical or inguinal adenopathy. Neurologic: Grossly intact, no focal deficits, moving all 4 extremities. Psychiatric: Normal mood and affect.   Laboratory Data: Lab Results  Component Value Date   WBC 6.8 11/30/2015   HGB 12.3 11/30/2015   HCT 38.1 11/30/2015   MCV 84.5 11/30/2015   PLT 236 11/30/2015    Lab Results  Component Value Date   CREATININE 0.98 02/16/2017    No results found for: PSA  No  results found for: TESTOSTERONE  Lab Results  Component Value Date   HGBA1C 6.4 (A) 10/05/2017    Lab Results  Component Value Date   TSH 4.60 (H) 11/30/2015       Component Value Date/Time   CHOL 160 02/16/2017 0938   CHOL 202 (H) 02/04/2015 0905   HDL 54 02/16/2017 0938   HDL 55 02/04/2015 0905   CHOLHDL 3.0 02/16/2017 0938   VLDL 33 (H) 11/30/2015 1123   LDLCALC 81 02/16/2017 0938    Lab Results  Component Value Date   AST 21 02/16/2017   Lab Results  Component Value Date   ALT 21 02/16/2017   No components found for: ALKALINEPHOPHATASE No components found for: BILIRUBINTOTAL  No results found for: ESTRADIOL  I have reviewed the labs.   Pertinent Imaging: Results for LORRA, FREEMAN (MRN [id number]) as of 11/12/2017 11:14  Ref. Range 10/15/2017 14:11  Scan Result Unknown 35m    Assessment & Plan:    1. Urgency Myrbetriq 25 mg daily is meeting patient's goals RTC in one year for OAB questionnaire and PVR  2. Incontinence See above  3. Nocturia See above   Return in about 1 year (around 11/13/2018) for PVR and OAB  questionnaire.  These notes generated with voice recognition software. I apologize for typographical errors.  SZara Council PA-C  [facility] [address] [place] [phone]

## 2017-11-12 ENCOUNTER — Encounter: Payer: Self-pay | Admitting: Urology

## 2017-11-12 ENCOUNTER — Ambulatory Visit (INDEPENDENT_AMBULATORY_CARE_PROVIDER_SITE_OTHER): Payer: Medicaid Other | Admitting: Urology

## 2017-11-12 VITALS — BP 139/85 | HR 70 | Ht 69.0 in | Wt 368.0 lb

## 2017-11-12 DIAGNOSIS — N3946 Mixed incontinence: Secondary | ICD-10-CM | POA: Diagnosis not present

## 2017-11-12 DIAGNOSIS — R351 Nocturia: Secondary | ICD-10-CM

## 2017-11-12 DIAGNOSIS — R3915 Urgency of urination: Secondary | ICD-10-CM | POA: Diagnosis not present

## 2017-11-12 MED ORDER — MIRABEGRON ER 25 MG PO TB24: 25.0000 mg | ORAL_TABLET | Freq: Every day | ORAL | 4 refills | Status: DC

## 2017-11-14 ENCOUNTER — Ambulatory Visit: Payer: Self-pay | Admitting: Podiatry

## 2017-11-16 ENCOUNTER — Encounter: Payer: Self-pay | Admitting: Urology

## 2017-11-24 ENCOUNTER — Other Ambulatory Visit: Payer: Self-pay | Admitting: Family Medicine

## 2017-12-10 ENCOUNTER — Other Ambulatory Visit: Payer: Self-pay | Admitting: Family Medicine

## 2017-12-10 ENCOUNTER — Encounter: Payer: Self-pay | Admitting: Urology

## 2017-12-14 NOTE — Telephone Encounter (Signed)
Pt came by office and picked up some samples of Myrbetriq 25 mg

## 2017-12-24 ENCOUNTER — Inpatient Hospital Stay: Payer: Medicaid Other | Attending: Oncology

## 2017-12-27 ENCOUNTER — Other Ambulatory Visit: Payer: Self-pay | Admitting: Family Medicine

## 2017-12-28 ENCOUNTER — Encounter: Payer: Self-pay | Admitting: Oncology

## 2017-12-28 ENCOUNTER — Telehealth: Payer: Self-pay | Admitting: Oncology

## 2017-12-28 NOTE — Telephone Encounter (Signed)
[facility], per patient request via [facility] [place]. Left msg w appt info on v/m.

## 2017-12-31 ENCOUNTER — Inpatient Hospital Stay: Payer: Medicaid Other | Attending: Oncology

## 2018-01-11 ENCOUNTER — Encounter: Payer: Self-pay | Admitting: Family Medicine

## 2018-01-15 ENCOUNTER — Other Ambulatory Visit: Payer: Self-pay | Admitting: Nurse Practitioner

## 2018-01-15 DIAGNOSIS — Z111 Encounter for screening for respiratory tuberculosis: Secondary | ICD-10-CM

## 2018-01-15 NOTE — Telephone Encounter (Signed)
Pt has appt already scheduled with Dr Sanda Klein for Monday 9.9.19. She would like to know if your able to go ahead and write her an order to do the TB testing before her appt?

## 2018-01-15 NOTE — Telephone Encounter (Signed)
-----   Message from [place] sent at 01/15/2018 11:27 AM EDT -----   ----- Message ----- From: Fredderick Severance, NP Sent: 01/11/2018   3:59 PM EDT To: Andria Frames Mock  Please schedule physical for patient.  [facility]!   Hi,    I am watching Dr. Delight Ovens messages while she is out of the office. Congrats on your new job! I will forward this to our front desk so they can schedule you an appointment to get this taken care of.  Thanks, Suezanne Cheshire DNP, AGNP-C ===View-only below this line===   ----- Message -----    From: [name]    Sent: 01/11/2018 11:03 AM EDT      To: Enid Derry, MD Subject: Non-Urgent Medical Question  I am starting a new job and need a TB test and a physical form filled out. I need this done as quickly as possible so I can start.

## 2018-01-17 ENCOUNTER — Other Ambulatory Visit: Payer: Self-pay

## 2018-01-17 DIAGNOSIS — Z111 Encounter for screening for respiratory tuberculosis: Secondary | ICD-10-CM

## 2018-01-19 LAB — QUANTIFERON-TB GOLD PLUS
NIL: 0.09 [IU]/mL
QuantiFERON-TB Gold Plus: NEGATIVE
TB1-NIL: 0 [IU]/mL
TB2-NIL: <0 IU/mL

## 2018-01-21 ENCOUNTER — Encounter: Payer: Self-pay | Admitting: Family Medicine

## 2018-01-21 ENCOUNTER — Ambulatory Visit: Payer: Medicaid Other | Admitting: Family Medicine

## 2018-01-21 VITALS — BP 124/78 | HR 98 | Temp 98.2°F | Ht 68.5 in | Wt 364.4 lb

## 2018-01-21 DIAGNOSIS — Z170 Estrogen receptor positive status [ER+]: Secondary | ICD-10-CM | POA: Diagnosis not present

## 2018-01-21 DIAGNOSIS — G4733 Obstructive sleep apnea (adult) (pediatric): Secondary | ICD-10-CM | POA: Diagnosis not present

## 2018-01-21 DIAGNOSIS — R7303 Prediabetes: Secondary | ICD-10-CM

## 2018-01-21 DIAGNOSIS — Z23 Encounter for immunization: Secondary | ICD-10-CM | POA: Diagnosis not present

## 2018-01-21 DIAGNOSIS — Z9989 Dependence on other enabling machines and devices: Secondary | ICD-10-CM

## 2018-01-21 DIAGNOSIS — E785 Hyperlipidemia, unspecified: Secondary | ICD-10-CM | POA: Diagnosis not present

## 2018-01-21 DIAGNOSIS — Z5181 Encounter for therapeutic drug level monitoring: Secondary | ICD-10-CM | POA: Diagnosis not present

## 2018-01-21 DIAGNOSIS — C50412 Malignant neoplasm of upper-outer quadrant of left female breast: Secondary | ICD-10-CM

## 2018-01-21 DIAGNOSIS — I10 Essential (primary) hypertension: Secondary | ICD-10-CM | POA: Diagnosis not present

## 2018-01-21 NOTE — Assessment & Plan Note (Signed)
Controlled today; try the DASH guidelines

## 2018-01-21 NOTE — Assessment & Plan Note (Signed)
Not using CPAP, weight loss will help

## 2018-01-21 NOTE — Assessment & Plan Note (Signed)
Keep trying; water intake and movement

## 2018-01-21 NOTE — Assessment & Plan Note (Signed)
Monitored by Dr. Janese Banks

## 2018-01-21 NOTE — Progress Notes (Signed)
BP 124/78   Pulse 98   Temp 98.2 F (36.8 C) (Oral)   Ht 5' 8.5" (1.74 m)   Wt (!) 364 lb 6.4 oz (165.3 kg)   LMP 02/18/2015 (Approximate)   SpO2 97%   BMI 54.60 kg/m    Subjective:    Patient ID: Elizabeth Hoffman, female    DOB: 22-Sep-1970, 47 y.o.   MRN: [id number]  HPI: Elizabeth Hoffman is a 47 y.o. female  Chief Complaint  Patient presents with  . 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 Janese Banks; recently changed medicine; she was not having symptoms, but when she had her hysterectomy, this would be a better option with hot flashes expected She was having crazy overactive bladder; now on myrbetriq which works well; had quit caffeine before seeing urologist  Hypertension; controlled today, even without medicine  Prediabetes; on metformin; goes to the bathroom with looser stools than normal, tolerable; unsure about fam hx (adopted); only one reading of A1c 6.6, immediate recheck was 6.4  Morbid obesity; she eats out of habit and boredom; uses food for comfort; not interested in counseling or nutritionist; hoping working will help; sometimes really good about drinking water, can do it  High cholesterol; trying to limit saturated fats; meats twice a week  OSA; had to surrender her machine because of noncompliance; she did not like it; sleeps in her recliner, more comfortable  Depression screen [facility] 2/9 01/21/2018 10/02/2017 11/28/2016 11/03/2016 11/30/2015  Decreased Interest 0 0 0 0 0  Down, Depressed, Hopeless 0 0 0 0 0  PHQ - 2 Score 0 0 0 0 0  Altered sleeping 0 - - - -  Tired, decreased energy 0 - - - -  Change in appetite 0 - - - -  Feeling bad or failure about yourself  0 - - - -  Trouble concentrating 0 - - - -  Moving slowly  or fidgety/restless 0 - - - -  Suicidal thoughts 0 - - - -  PHQ-9 Score 0 - - - -  Difficult doing work/chores Not difficult at all - - - -    Relevant past medical, surgical, family and social history reviewed Past Medical History:  Diagnosis Date  . Anxiety   . Arthritis   . Breast cancer ([place]) 2016  . Breast cancer ([place])   . Depression   . Hot flashes   . Hypertension   . OSA on CPAP 11/03/2016   Past Surgical History:  Procedure Laterality Date  . ABDOMINAL HYSTERECTOMY  06/15/2017  . BREAST EXCISIONAL BIOPSY Left 03/18/2015   breast ca +  . BREAST LUMPECTOMY WITH RADIOACTIVE SEED LOCALIZATION Left 03/18/2015   Procedure: BREAST LUMPECTOMY WITH RADIOACTIVE SEED LOCALIZATION;  Surgeon: Erroll Luna, MD;  Location: [facility];  Service: General;  Laterality: Left;  . CERVICAL BIOPSY  W/ LOOP ELECTRODE EXCISION    . CHOLECYSTECTOMY    . HERNIA REPAIR    . MASTECTOMY, PARTIAL Left 02/15/2015  . NASAL SEPTUM SURGERY    . TONSILLECTOMY AND ADENOIDECTOMY    . UVULECTOMY     Family History  Adopted: Yes  Family history unknown: Yes   Social History   Tobacco Use  . Smoking status:  Never Smoker  . Smokeless tobacco: Never Used  Substance Use Topics  . Alcohol use: No    Alcohol/week: 0.0 standard drinks  . Drug use: No    Interim medical history since last visit reviewed. Allergies and medications reviewed  Review of Systems Per HPI unless specifically indicated above     Objective:    BP 124/78   Pulse 98   Temp 98.2 F (36.8 C) (Oral)   Ht 5' 8.5" (1.74 m)   Wt (!) 364 lb 6.4 oz (165.3 kg)   LMP 02/18/2015 (Approximate)   SpO2 97%   BMI 54.60 kg/m   Wt Readings from Last 3 Encounters:  01/21/18 (!) 364 lb 6.4 oz (165.3 kg)  11/12/17 (!) 368 lb (166.9 kg)  10/19/17 (!) 368 lb 12.8 oz (167.3 kg)    Physical Exam  Constitutional: She appears well-developed and well-nourished. No distress.  HENT:  Head: Normocephalic and atraumatic.    Eyes: EOM are normal. No scleral icterus.  Neck: No thyromegaly present.  Cardiovascular: Normal rate, regular rhythm and normal heart sounds.  No murmur heard. Pulmonary/Chest: Effort normal and breath sounds normal. No respiratory distress. She has no wheezes.  Abdominal: Soft. Bowel sounds are normal. She exhibits no distension.  Musculoskeletal: She exhibits no edema.  Neurological: She is alert.  Skin: Skin is warm and dry. She is not diaphoretic. No pallor.  Psychiatric: She has a normal mood and affect. Her behavior is normal. Judgment and thought content normal.    Results for orders placed or performed in visit on 01/17/18  QuantiFERON-TB Gold Plus  Result Value Ref Range   QuantiFERON-TB Gold Plus NEGATIVE NEGATIVE   NIL 0.09 IU/mL   Mitogen-NIL >10.00 IU/mL   TB1-NIL 0.00 IU/mL   TB2-NIL <0.00 IU/mL      Assessment & Plan:   Problem List Items Addressed This Visit      Cardiovascular and Mediastinum   Hypertension goal BP (blood pressure) < 140/90    Controlled today; try the DASH guidelines        Respiratory   OSA on CPAP    Not using CPAP, weight loss will help        Other   Pre-diabetes    Check A1c today; contact with results      Relevant Orders   Hemoglobin A1c   Microalbumin / creatinine urine ratio   Obesity, morbid, BMI 50 or higher ([place])    Keep trying; water intake and movement      Malignant neoplasm of upper-outer quadrant of left breast in female, estrogen receptor positive ([place])    Monitored by Dr. Janese Banks      Hyperlipidemia LDL goal <100 - Primary    Limit saturated fats; check lipids today      Relevant Orders   Lipid panel    Other Visit Diagnoses    Need for influenza vaccination       Relevant Orders   Flu Vaccine QUAD 6+ mos PF IM (Fluarix Quad PF) (Completed)   Medication monitoring encounter       Relevant Orders   COMPLETE METABOLIC PANEL WITH GFR   Microalbumin / creatinine urine ratio       Follow up  plan: Return in about 3 months (around 04/22/2018) for follow-up visit with Dr. Sanda Klein; do not need to fast.  An after-visit summary was printed and given to the patient at [facility].  Please see the patient instructions which may contain other information and recommendations beyond what  is mentioned above in the assessment and plan.  No orders of the defined types were placed in this encounter.   Orders Placed This Encounter  Procedures  . Flu Vaccine QUAD 6+ mos PF IM (Fluarix Quad PF)  . Lipid panel  . Hemoglobin A1c  . COMPLETE METABOLIC PANEL WITH GFR  . Microalbumin / creatinine urine ratio

## 2018-01-21 NOTE — Assessment & Plan Note (Signed)
Limit saturated fats; check lipids today

## 2018-01-21 NOTE — Assessment & Plan Note (Signed)
Check A1c today; contact with results

## 2018-01-22 LAB — COMPLETE METABOLIC PANEL WITH GFR
AG Ratio: 1.3 (calc) (ref 1.0–2.5)
ALKALINE PHOSPHATASE (APISO): 56 U/L (ref 33–115)
ALT: 28 U/L (ref 6–29)
AST: 28 U/L (ref 10–35)
Albumin: 4 g/dL (ref 3.6–5.1)
BILIRUBIN TOTAL: 0.3 mg/dL (ref 0.2–1.2)
BUN: 14 mg/dL (ref 7–25)
CHLORIDE: 105 mmol/L (ref 98–110)
CO2: 26 mmol/L (ref 20–32)
Calcium: 9.6 mg/dL (ref 8.6–10.2)
Creat: 1 mg/dL (ref 0.50–1.10)
GFR, Est African American: 78 mL/min/{1.73_m2} (ref >=60)
GFR, Est Non African American: 68 mL/min/{1.73_m2} (ref >=60)
Globulin: 3 g/dL (calc) (ref 1.9–3.7)
Glucose, Bld: 88 mg/dL (ref 65–99)
Potassium: 4 mmol/L (ref 3.5–5.3)
Sodium: 143 mmol/L (ref 135–146)
Total Protein: 7 g/dL (ref 6.1–8.1)

## 2018-01-22 LAB — MICROALBUMIN / CREATININE URINE RATIO
CREATININE, URINE: 252 mg/dL (ref 20–275)
MICROALB UR: 0.8 mg/dL
MICROALB/CREAT RATIO: 3 ug/mg{creat} (ref <30)

## 2018-01-22 LAB — HEMOGLOBIN A1C
EAG (MMOL/L): 7.4 (calc)
Hgb A1c MFr Bld: 6.3 % of total Hgb — ABNORMAL HIGH (ref <5.7)
Mean Plasma Glucose: 134 (calc)

## 2018-01-22 LAB — LIPID PANEL
CHOL/HDL RATIO: 2.9 (calc) (ref <5.0)
CHOLESTEROL: 167 mg/dL (ref <200)
HDL: 57 mg/dL (ref >50)
LDL Cholesterol (Calc): 87 mg/dL (calc)
Non-HDL Cholesterol (Calc): 110 mg/dL (calc) (ref <130)
Triglycerides: 135 mg/dL (ref <150)

## 2018-02-01 ENCOUNTER — Other Ambulatory Visit: Payer: Self-pay | Admitting: Nurse Practitioner

## 2018-02-11 ENCOUNTER — Telehealth: Payer: Self-pay

## 2018-02-11 MED ORDER — FESOTERODINE FUMARATE ER 4 MG PO TB24: 4.0000 mg | ORAL_TABLET | Freq: Every day | ORAL | 2 refills | Status: DC

## 2018-02-11 NOTE — Telephone Encounter (Signed)
Pt has medicaid and they will not cover Myrbetriq until pt has tried and failed two alternatives.   Oxybutynin Oxybutynin ER Toviaz Vesicare  Please advise.

## 2018-02-11 NOTE — Telephone Encounter (Signed)
Let's give her a trial of Toviaz 4 mg daily.  Advise her of the side effects, such as dry eyes, dry mouth, constipation and possible memory issues.  I will need to see her in 6 weeks for a PVR and symptoms recheck.

## 2018-02-21 ENCOUNTER — Encounter: Payer: Self-pay | Admitting: Family Medicine

## 2018-02-21 DIAGNOSIS — M79673 Pain in unspecified foot: Secondary | ICD-10-CM

## 2018-02-21 NOTE — Telephone Encounter (Signed)
Please resolve

## 2018-03-02 ENCOUNTER — Other Ambulatory Visit: Payer: Self-pay | Admitting: Oncology

## 2018-03-19 ENCOUNTER — Encounter: Payer: Self-pay | Admitting: Podiatry

## 2018-03-19 ENCOUNTER — Ambulatory Visit: Payer: Medicaid Other | Admitting: Podiatry

## 2018-03-19 ENCOUNTER — Ambulatory Visit (INDEPENDENT_AMBULATORY_CARE_PROVIDER_SITE_OTHER): Payer: Medicaid Other

## 2018-03-19 DIAGNOSIS — M7752 Other enthesopathy of left foot: Secondary | ICD-10-CM

## 2018-03-19 DIAGNOSIS — M779 Enthesopathy, unspecified: Principal | ICD-10-CM

## 2018-03-19 DIAGNOSIS — M778 Other enthesopathies, not elsewhere classified: Secondary | ICD-10-CM

## 2018-03-19 MED ORDER — MELOXICAM 15 MG PO TABS: 15.0000 mg | ORAL_TABLET | Freq: Every day | ORAL | 1 refills | Status: AC

## 2018-03-21 NOTE — Progress Notes (Signed)
   HPI: 47 year old female presenting today as a new patient with a chief complaint of sharp, shooting pain in the dorsal aspect of the left foot that has been ongoing for the past few years. She states the pain is worse when she stands after sitting for long periods of time. Flexing the foot increases the pain. She has been taking Ibuprofen for treatment with some relief. Patient is here for further evaluation and treatment.   Past Medical History:  Diagnosis Date  . Anxiety   . Arthritis   . Breast cancer ([place]) 2016  . Breast cancer ([place])   . Depression   . Hot flashes   . Hypertension   . OSA on CPAP 11/03/2016     Physical Exam: General: The patient is alert and oriented x3 in no acute distress.  Dermatology: Skin is warm, dry and supple bilateral lower extremities. Negative for open lesions or macerations.  Vascular: Palpable pedal pulses bilaterally. No edema or erythema noted. Capillary refill within normal limits.  Neurological: Epicritic and protective threshold grossly intact bilaterally.   Musculoskeletal Exam: Pain with palpation to the dorsal aspect of the left foot. Range of motion within normal limits to all pedal and ankle joints bilateral. Muscle strength 5/5 in all groups bilateral.   Radiographic Exam:  Normal osseous mineralization. Joint spaces preserved. No fracture/dislocation/boney destruction.    Assessment: 1. Capsulitis left foot   Plan of Care:  1. Patient evaluated. X-Rays reviewed.  2. Injection of 0.5 mLs Celestone Soluspan injected into the dorsal left foot.  3. Prescription for Meloxicam provided to patient. 4. Recommended good shoe gear.  5. Return to clinic as needed.       Edrick Kins, DPM [facility]  Dr. Edrick Kins, DPM    [address], [place] [address]                Office [phone]  Fax [phone]

## 2018-04-08 ENCOUNTER — Encounter: Payer: Self-pay | Admitting: Family Medicine

## 2018-04-10 ENCOUNTER — Telehealth: Payer: Self-pay

## 2018-04-10 DIAGNOSIS — M5412 Radiculopathy, cervical region: Secondary | ICD-10-CM

## 2018-04-10 NOTE — Telephone Encounter (Signed)
Thank you for starting referral Ortho is fine

## 2018-04-10 NOTE — Telephone Encounter (Signed)
Does this referral need to be ortho or neurosurgeon?

## 2018-04-26 ENCOUNTER — Ambulatory Visit: Payer: Medicaid Other | Admitting: Oncology

## 2018-05-02 ENCOUNTER — Inpatient Hospital Stay: Payer: Medicaid Other | Admitting: Oncology

## 2018-05-02 ENCOUNTER — Ambulatory Visit: Payer: Medicaid Other | Admitting: Family Medicine

## 2018-05-02 DIAGNOSIS — M5412 Radiculopathy, cervical region: Secondary | ICD-10-CM | POA: Diagnosis not present

## 2018-05-04 ENCOUNTER — Other Ambulatory Visit: Payer: Self-pay | Admitting: Family Medicine

## 2018-05-06 NOTE — Telephone Encounter (Signed)
Lab Results  Component Value Date   CREATININE 1.00 01/21/2018

## 2018-05-09 ENCOUNTER — Ambulatory Visit: Payer: Medicaid Other | Admitting: Family Medicine

## 2018-05-09 ENCOUNTER — Inpatient Hospital Stay: Payer: Medicaid Other | Admitting: Oncology

## 2018-05-23 ENCOUNTER — Other Ambulatory Visit: Payer: Self-pay | Admitting: Urology

## 2018-05-23 ENCOUNTER — Ambulatory Visit: Payer: Medicaid Other | Admitting: Oncology

## 2018-05-23 ENCOUNTER — Telehealth: Payer: Self-pay | Admitting: Urology

## 2018-05-23 DIAGNOSIS — M5412 Radiculopathy, cervical region: Secondary | ICD-10-CM | POA: Diagnosis not present

## 2018-05-23 NOTE — Telephone Encounter (Signed)
[place] is requesting a refill on her Elizabeth Hoffman, but we had switched her from Myrbetriq to [place].  She will need an office visit if she wants to continue the Toviaz as this is a change in medication.

## 2018-05-27 ENCOUNTER — Encounter: Payer: Self-pay | Admitting: Family Medicine

## 2018-05-28 ENCOUNTER — Ambulatory Visit: Payer: Medicaid Other | Admitting: Nurse Practitioner

## 2018-05-28 ENCOUNTER — Encounter: Payer: Self-pay | Admitting: Nurse Practitioner

## 2018-05-28 VITALS — BP 122/78 | HR 97 | Temp 98.2°F | Resp 16 | Ht 68.5 in | Wt 348.5 lb

## 2018-05-28 DIAGNOSIS — R0981 Nasal congestion: Secondary | ICD-10-CM | POA: Diagnosis not present

## 2018-05-28 DIAGNOSIS — R300 Dysuria: Secondary | ICD-10-CM | POA: Diagnosis not present

## 2018-05-28 DIAGNOSIS — J0141 Acute recurrent pansinusitis: Secondary | ICD-10-CM | POA: Diagnosis not present

## 2018-05-28 LAB — POCT URINALYSIS DIPSTICK
BILIRUBIN UA: NEGATIVE
Blood, UA: NEGATIVE
GLUCOSE UA: NEGATIVE
Ketones, UA: NEGATIVE
Leukocytes, UA: NEGATIVE
Nitrite, UA: NEGATIVE
Odor: NORMAL
PH UA: 5 (ref 5.0–8.0)
Protein, UA: NEGATIVE
Spec Grav, UA: 1.02 (ref 1.010–1.025)
UROBILINOGEN UA: 0.2 U/dL

## 2018-05-28 MED ORDER — AMOXICILLIN-POT CLAVULANATE 875-125 MG PO TABS: 1.0000 | ORAL_TABLET | Freq: Two times a day (BID) | ORAL | 0 refills | Status: DC

## 2018-05-28 MED ORDER — FLUTICASONE PROPIONATE 50 MCG/ACT NA SUSP: 2.0000 | Freq: Every day | NASAL | 6 refills | Status: DC

## 2018-05-28 NOTE — Progress Notes (Signed)
Name: Elizabeth Hoffman   MRN: [id number]    DOB: 03/10/1971   Date:05/28/2018       Progress Note  Subjective  Chief Complaint  Chief Complaint  Patient presents with  . Urinary Tract Infection    HPI  Patient endorses dysuria ongoing for 3-4 days. Drinking lots of water, endorses frequency, hesitancy.  Denies nausea, vomiting, back pain, chills, vaginal discharge.   Also endorses URI symptoms ongoing for 2-3 weeks. Started with nasal congestion, would feel better for a few days and then come right back. States lots of pressure behind eyes and strong cough with thick yellow phlegm. Denies shortness of breath and chest pain. Has tried dayquil and nyquil. Worst a night.   Patient Active Problem List   Diagnosis Date Noted  . OSA on CPAP 11/03/2016  . History of cervical cancer 02/02/2016  . Hypercoagulable state ([place]) 02/02/2016  . Abnormal uterine bleeding (AUB) 10/02/2015  . Obesity, morbid, BMI 50 or higher ([place]) 07/05/2015  . Malignant neoplasm of upper-outer quadrant of left breast in female, estrogen receptor positive ([facility]) 02/22/2015  . Hyperlipidemia LDL goal <100 02/08/2015  . GERD without esophagitis 02/03/2015  . Menorrhagia with irregular cycle 02/03/2015  . Depressive disorder 02/03/2015  . Hypertension goal BP (blood pressure) < 140/90 02/03/2015  . De Quervain's tenosynovitis, bilateral 02/03/2015  . Numerous moles 02/03/2015  . Pre-diabetes 02/03/2015  . History of loop electrosurgical excision procedure (LEEP) of cervix 02/03/2015  . Encounter for screening mammogram for malignant neoplasm of breast 02/03/2015  . Encounter for cholesteral screening for cardiovascular disease 02/03/2015    Past Medical History:  Diagnosis Date  . Anxiety   . Arthritis   . Breast cancer ([facility]) 2016  . Breast cancer ([facility])   . Depression   . Hot flashes   . Hypertension   . OSA on CPAP 11/03/2016    Past Surgical History:  Procedure Laterality Date  . ABDOMINAL  HYSTERECTOMY  06/15/2017  . BREAST EXCISIONAL BIOPSY Left 03/18/2015   breast ca +  . BREAST LUMPECTOMY WITH RADIOACTIVE SEED LOCALIZATION Left 03/18/2015   Procedure: BREAST LUMPECTOMY WITH RADIOACTIVE SEED LOCALIZATION;  Surgeon: Erroll Luna, MD;  Location: [facility];  Service: General;  Laterality: Left;  . CERVICAL BIOPSY  W/ LOOP ELECTRODE EXCISION    . CHOLECYSTECTOMY    . HERNIA REPAIR    . MASTECTOMY, PARTIAL Left 02/15/2015  . NASAL SEPTUM SURGERY    . TONSILLECTOMY AND ADENOIDECTOMY    . UVULECTOMY      Social History   Tobacco Use  . Smoking status: Never Smoker  . Smokeless tobacco: Never Used  Substance Use Topics  . Alcohol use: No    Alcohol/week: 0.0 standard drinks     Current Outpatient Medications:  .  amLODipine (NORVASC) 10 MG tablet, Take 1 tablet (10 mg total) by mouth daily., Disp: 90 tablet, Rfl: 3 .  anastrozole (ARIMIDEX) 1 MG tablet, TAKE ONE TABLET BY MOUTH DAILY, Disp: 30 tablet, Rfl: 2 .  fesoterodine (TOVIAZ) 4 MG TB24 tablet, Take 1 tablet (4 mg total) by mouth daily., Disp: 30 tablet, Rfl: 2 .  hydrochlorothiazide (HYDRODIURIL) 25 MG tablet, TAKE ONE TABLET BY MOUTH DAILY, Disp: 30 tablet, Rfl: 11 .  meloxicam (MOBIC) 15 MG tablet, Mobic 15 mg tablet  Take 1 tablet every day by oral route., Disp: , Rfl:  .  metFORMIN (GLUCOPHAGE-XR) 500 MG 24 hr tablet, TAKE THREE TABLETS BY MOUTH DAILY, Disp: 90 tablet, Rfl: 2 .  omeprazole (PRILOSEC) 20 MG capsule, Take 20 mg by mouth daily., Disp: , Rfl:  .  sertraline (ZOLOFT) 100 MG tablet, TAKE ONE TABLET BY MOUTH DAILY, Disp: 90 tablet, Rfl: 1 .  amoxicillin-clavulanate (AUGMENTIN) 875-125 MG tablet, Take 1 tablet by mouth 2 (two) times daily., Disp: 14 tablet, Rfl: 0  No Known Allergies  ROS   No other specific complaints in a complete review of systems (except as listed in HPI above).  Objective  Vitals:   05/28/18 1149  BP: 122/78  Pulse: 97  Resp: 16  Temp: 98.2 F (36.8  C)  SpO2: 95%  Weight: (!) 348 lb 8 oz (158.1 kg)  Height: 5' 8.5" (1.74 m)     Body mass index is 52.22 kg/m.  Nursing Note and Vital Signs reviewed.  Physical Exam HENT:     Head: Normocephalic and atraumatic.     Right Ear: Hearing, tympanic membrane, ear canal and external ear normal.     Left Ear: Hearing, tympanic membrane, ear canal and external ear normal.     Nose: Nasal tenderness, mucosal edema and congestion present.     Right Sinus: Maxillary sinus tenderness and frontal sinus tenderness present.     Left Sinus: Maxillary sinus tenderness and frontal sinus tenderness present.     Mouth/Throat:     Mouth: Mucous membranes are moist.     Pharynx: Uvula midline. No oropharyngeal exudate or posterior oropharyngeal erythema.  Eyes:     General:        Right eye: No discharge.        Left eye: No discharge.     Conjunctiva/sclera: Conjunctivae normal.  Neck:     Musculoskeletal: Normal range of motion.  Cardiovascular:     Rate and Rhythm: Normal rate.  Pulmonary:     Effort: Pulmonary effort is normal.     Breath sounds: Normal breath sounds.  Abdominal:     General: Bowel sounds are normal.     Tenderness: There is no abdominal tenderness. There is no right CVA tenderness or left CVA tenderness.  Lymphadenopathy:     Cervical: No cervical adenopathy.  Skin:    General: Skin is warm and dry.     Findings: No rash.  Neurological:     Mental Status: She is alert.  Psychiatric:        Judgment: Judgment normal.        Results for orders placed or performed in visit on 05/28/18 (from the past 48 hour(s))  POCT urinalysis dipstick     Status: Normal   Collection Time: 05/28/18 12:00 PM  Result Value Ref Range   Color, UA drk yellow    Clarity, UA clear    Glucose, UA Negative Negative   Bilirubin, UA neg    Ketones, UA neg    Spec Grav, UA 1.020 1.010 - 1.025   Blood, UA neg    pH, UA 5.0 5.0 - 8.0   Protein, UA Negative Negative   Urobilinogen,  UA 0.2 0.2 or 1.0 E.U./dL   Nitrite, UA neg    Leukocytes, UA Negative Negative   Appearance drk yellow    Odor normal     Assessment & Plan 1. Burning with urination  Urine came back clear but just shows that it is concentrated meaning you are likely dehydrated. Please drink at least 64 ounces of water, avoid caffeine or at least limit to one 8 ounce caffeineated beverage a day for the next week. Can take AZO  otc as directed for 2 days to help with pain relief.  - POCT urinalysis dipstick  2. Nasal congestion - fluticasone (FLONASE) 50 MCG/ACT nasal spray; Place 2 sprays into both nostrils daily.  Dispense: 16 g; Refill: 6  3. Acute recurrent pansinusitis Sinusitis: please complete antibiotic therapy for entire course even if you feel better before. Drink plenty of water and rest. Take a daily allergy medicine such as claratin in addition to flonase. Can use a neti pot and humidifier for additional relief.  - amoxicillin-clavulanate (AUGMENTIN) 875-125 MG tablet; Take 1 tablet by mouth 2 (two) times daily.  Dispense: 14 tablet; Refill: 0

## 2018-05-28 NOTE — Telephone Encounter (Signed)
Patient was seen by NP

## 2018-05-28 NOTE — Patient Instructions (Addendum)
-   Urine came back clear but just shows that it is concentrated meaning you are likely dehydrated. Please drink at least 64 ounces of water, avoid caffeine or at least limit to one 8 ounce caffeineated beverage a day for the next week. Can take AZO otc as directed for 2 days to help with pain relief.   - Sinusitis: please complete antibiotic therapy for entire course even if you feel better before. Drink plenty of water and rest. Take a daily allergy medicine such as claratin in addition to flonase. Can use a neti pot and humidifier for additional relief.

## 2018-05-30 ENCOUNTER — Other Ambulatory Visit: Payer: Self-pay

## 2018-05-30 MED ORDER — FESOTERODINE FUMARATE ER 4 MG PO TB24: 4.0000 mg | ORAL_TABLET | Freq: Every day | ORAL | 0 refills | Status: DC

## 2018-05-30 NOTE — Telephone Encounter (Signed)
1 refill of Toviaz sent to pharmacy with patient aware she needs to make appt before any more refills. Angie making future appt.

## 2018-06-17 NOTE — Progress Notes (Incomplete)
06/18/2018  8:40 AM   Elizabeth Hoffman May 04, 1971 [id number]  Referring provider: Arnetha Courser, MD [address] [place], [place] [address]  No chief complaint on file.   HPI: Elizabeth Hoffman is a 48 y.o. female Caucasian with a history of urgency, incontinence and nocturia who was placed on *** Myrbetriq 25 mg daily and returns for medication management.  Background history Patient is a 48 year old Caucasian female referred by Dr. Enid Derry for urinary frequency, dysuria and lower abdominal pain who presents with her 6 year daughter, Elizabeth Hoffman.   She was seen on 10/05/2017 by Dr. Sanda Klein and was complaining of dysuria.  Her urine culture was positive for E. Coli that was pan sensitive.   She is having urinary frequency x "can't even tell", strong urgency, nocturia x 3-4 and urge incontinence for months.  She is wearing POISE pads, two in the day and three at night.  She denies dysuria, gross hematuria, suprapubic pain, back pain, abdominal pain or flank pain.  She has not had any recent fevers, chills, nausea or vomiting.  Her UA is with moderate bacteria.  Her PVR is 5 mL.  She had a remote history of stone.  She passed it spontaneously.  She does not have a history of GU surgery or GU trauma.   She had a cystoscopy during her hysterectomy and brisk bilateral ureteral jets and intact bladder integrity were seen in 05/2017.  She denies constipation and/or diarrhea.  She is drinking two bottles of water daily.   Once in a blue moon, sweat tea.  No sodas.  No juices.  No alcohol.    On 11/12/2017, the patient was experiencing urgency x 0-3 (improved), frequency x 8 or more (improved), was restricting fluids to avoid visits to the restroom, was engaging in toilet mapping, incontinence x 0-3 (improved) and nocturia x 0-3 (improved).  BP was 139/85.   Her PVR was 5 mL.   Patient denied any gross hematuria, dysuria or suprapubic/flank pain.  Patient denied any fevers, chills, nausea or  vomiting.  She had found the Myrbetriq 25 mg daily very effective in achieving her goals.   She was also no longer wearing pads.    *** {When did she get switched to Toviaz?}  PMH: Past Medical History:  Diagnosis Date   Anxiety    Arthritis    Breast cancer ([facility]) 2016   Breast cancer ([facility])    Depression    Hot flashes    Hypertension    OSA on CPAP 11/03/2016    Surgical History: Past Surgical History:  Procedure Laterality Date   ABDOMINAL HYSTERECTOMY  06/15/2017   BREAST EXCISIONAL BIOPSY Left 03/18/2015   breast ca +   BREAST LUMPECTOMY WITH RADIOACTIVE SEED LOCALIZATION Left 03/18/2015   Procedure: BREAST LUMPECTOMY WITH RADIOACTIVE SEED LOCALIZATION;  Surgeon: Erroll Luna, MD;  Location: [facility];  Service: General;  Laterality: Left;   CERVICAL BIOPSY  W/ LOOP ELECTRODE EXCISION     CHOLECYSTECTOMY     HERNIA REPAIR     MASTECTOMY, PARTIAL Left 02/15/2015   NASAL SEPTUM SURGERY     TONSILLECTOMY AND ADENOIDECTOMY     UVULECTOMY      Home Medications:  Allergies as of 06/18/2018   No Known Allergies     Medication List       Accurate as of June 18, 2018  8:40 AM. Always use your most recent med list.  amLODipine 10 MG tablet Commonly known as:  NORVASC Take 1 tablet (10 mg total) by mouth daily.   amoxicillin-clavulanate 875-125 MG tablet Commonly known as:  AUGMENTIN Take 1 tablet by mouth 2 (two) times daily.   anastrozole 1 MG tablet Commonly known as:  ARIMIDEX TAKE ONE TABLET BY MOUTH DAILY   fesoterodine 4 MG Tb24 tablet Commonly known as:  TOVIAZ Take 1 tablet (4 mg total) by mouth daily.   fluticasone 50 MCG/ACT nasal spray Commonly known as:  FLONASE Place 2 sprays into both nostrils daily.   hydrochlorothiazide 25 MG tablet Commonly known as:  HYDRODIURIL TAKE ONE TABLET BY MOUTH DAILY   metFORMIN 500 MG 24 hr tablet Commonly known as:  GLUCOPHAGE-XR TAKE THREE TABLETS BY MOUTH DAILY    MOBIC 15 MG tablet Generic drug:  meloxicam Mobic 15 mg tablet  Take 1 tablet every day by oral route.   omeprazole 20 MG capsule Commonly known as:  PRILOSEC Take 20 mg by mouth daily.   sertraline 100 MG tablet Commonly known as:  ZOLOFT TAKE ONE TABLET BY MOUTH DAILY       Allergies: No Known Allergies  Family History: Family History  Adopted: Yes  Family history unknown: Yes    Social History:  reports that she has never smoked. She has never used smokeless tobacco. She reports that she does not drink alcohol or use drugs.  ROS:                                        Physical Exam: LMP 02/18/2015 (Approximate)   Constitutional: Well nourished. Alert and oriented, No acute distress. {HEENT: [place] AT, moist mucus membranes.  Trachea midline, no masses.} Cardiovascular: No clubbing, cyanosis, or edema. Respiratory: Normal respiratory effort, no increased work of breathing. {GI: Abdomen is soft, non tender, non distended, no abdominal masses. Liver and spleen not palpable.  No hernias appreciated.  Stool sample for occult testing is not indicated.} {GU: No CVA tenderness.  No bladder fullness or masses.  *** external genitalia, *** pubic hair distribution, no lesions.  Normal urethral meatus, no lesions, no prolapse, no discharge.   No urethral masses, tenderness and/or tenderness. No bladder fullness, tenderness or masses. *** vagina mucosa, *** estrogen effect, no discharge, no lesions, *** pelvic support, *** cystocele and *** rectocele noted.  No cervical motion tenderness.  Uterus is freely mobile and non-fixed.  No adnexal/parametria masses or tenderness noted.  Anus and perineum are without rashes or lesions.   *** } Skin: No rashes, bruises or suspicious lesions. {Lymph: No cervical or inguinal adenopathy.} Neurologic: Grossly intact, no focal deficits, moving all 4 extremities. Psychiatric: Normal mood and affect.   Laboratory Data:  Lab  Results  Component Value Date   CREATININE 1.00 01/21/2018   Lab Results  Component Value Date   HGBA1C 6.3 (H) 01/21/2018      Component Value Date/Time   CHOL 167 01/21/2018 1022   CHOL 202 (H) 02/04/2015 0905   HDL 57 01/21/2018 1022   HDL 55 02/04/2015 0905   CHOLHDL 2.9 01/21/2018 1022   VLDL 33 (H) 11/30/2015 1123   LDLCALC 87 01/21/2018 1022    Lab Results  Component Value Date   AST 28 01/21/2018   Lab Results  Component Value Date   ALT 28 01/21/2018   I have reviewed the labs.   Pertinent Imaging: Results for [place],  Elizabeth Hoffman (MRN [id number]) as of 11/12/2017 11:14  Ref. Range 10/15/2017 14:11  Scan Result Unknown 85m    Assessment & Plan:    1. Urgency *** - Myrbetriq 25 mg daily is meeting patient's goals *** - RTC in one year for OAB questionnaire and PVR  2. Incontinence See above  3. Nocturia See above   No follow-ups on file.  These notes generated with voice recognition software. I apologize for typographical errors.  [facility] [address] [place] [phone] I, KAdele Schilder am acting as a sEducation administratorfor [employer] PA-C.   {Add Scribe Attestation Statement}

## 2018-06-18 ENCOUNTER — Ambulatory Visit: Payer: Medicaid Other | Admitting: Urology

## 2018-06-22 ENCOUNTER — Other Ambulatory Visit: Payer: Self-pay | Admitting: Family Medicine

## 2018-06-22 ENCOUNTER — Other Ambulatory Visit: Payer: Self-pay | Admitting: Oncology

## 2018-06-25 ENCOUNTER — Ambulatory Visit (INDEPENDENT_AMBULATORY_CARE_PROVIDER_SITE_OTHER): Payer: Medicaid Other | Admitting: Urology

## 2018-06-25 ENCOUNTER — Encounter: Payer: Self-pay | Admitting: Urology

## 2018-06-25 VITALS — BP 137/83 | HR 80 | Ht 69.0 in | Wt 351.0 lb

## 2018-06-25 DIAGNOSIS — R351 Nocturia: Secondary | ICD-10-CM

## 2018-06-25 DIAGNOSIS — N3946 Mixed incontinence: Secondary | ICD-10-CM

## 2018-06-25 DIAGNOSIS — R3915 Urgency of urination: Secondary | ICD-10-CM

## 2018-06-25 LAB — BLADDER SCAN AMB NON-IMAGING

## 2018-06-25 MED ORDER — OXYBUTYNIN CHLORIDE 5 MG PO TABS: 5.0000 mg | ORAL_TABLET | Freq: Three times a day (TID) | ORAL | 3 refills | Status: DC

## 2018-06-25 NOTE — Progress Notes (Signed)
06/25/2018 12:03 PM   Elizabeth Hoffman 02-04-1971 [id number]  Referring provider: Arnetha Courser, MD [address] [place], [place] [address]  Chief Complaint  Patient presents with  . Urinary Urgency    follow up    HPI: Patient is a 48 year old Caucasian female with a history of urgency, incontinence and nocturia who was placed on Toviaz 4 mg daily and returns for follow-up.  Background history Patient is a 48 year old Caucasian female referred by Dr. Enid Derry for urinary frequency, dysuria and lower abdominal pain who presents with her 6 year daughter, Elizabeth Hoffman.   She was seen on 10/05/2017 by Dr. Sanda Klein and was complaining of dysuria.  Her urine culture was positive for E. Coli that was pan sensitive.   She is having urinary frequency x "can't even tell", strong urgency, nocturia x 3-4 and urge incontinence for months.  She is wearing POISE pads, two in the day and three at night.  She denies dysuria, gross hematuria, suprapubic pain, back pain, abdominal pain or flank pain.  She has not had any recent fevers, chills, nausea or vomiting.  Her UA is with moderate bacteria.  Her PVR is 5 mL.  She had a remote history of stone.  She passed it spontaneously.  She does not have a history of GU surgery or GU trauma.   She had a cystoscopy during her hysterectomy and brisk bilateral ureteral jets and intact bladder integrity were seen in 05/2017.  She denies constipation and/or diarrhea.  She is drinking two bottles of water daily.   Once in a blue moon, sweat tea.  No sodas.  No juices.  No alcohol.    At her visit on 11/12/2017, the patient has been experiencing urgency x 0-3 (improved), frequency x 8 or more (improved), is restricting fluids to avoid visits to the restroom, is engaging in toilet mapping, incontinence x 0-3 (improved) and nocturia x 0-3 (improved).  BP is 139/85.   Her PVR is 5 mL.   Patient denies any gross hematuria, dysuria or suprapubic/flank pain.  Patient  denies any fevers, chills, nausea or vomiting.  She has found the Myrbetriq 25 mg daily very effective in achieving her goals.   She is also no longer wearing pads.   She called the office on 02/11/2018 stating that her insurance did not cover the Myrbetriq.  She was then changed to Tony.    Today, she states the [name] is not as effective and she is experiencing severe dry mouth.   The patient is  experiencing urgency x 0-3 (stable), frequency x 8 or more (stable), is restricting fluids to avoid visits to the restroom, is engaging in toilet mapping, incontinence x 0-3 (stable) and nocturia x 0-3 (stable).   Her BP is 137/83.   Her PVR is 5 mL.    Patient denies any gross hematuria, dysuria or suprapubic/flank pain.  Patient denies any fevers, chills, nausea or vomiting.     PMH: Past Medical History:  Diagnosis Date  . Anxiety   . Arthritis   . Breast cancer ([facility]) 2016  . Breast cancer ([facility])   . Depression   . Hot flashes   . Hypertension   . OSA on CPAP 11/03/2016    Surgical History: Past Surgical History:  Procedure Laterality Date  . ABDOMINAL HYSTERECTOMY  06/15/2017  . BREAST EXCISIONAL BIOPSY Left 03/18/2015   breast ca +  . BREAST LUMPECTOMY WITH RADIOACTIVE SEED LOCALIZATION Left 03/18/2015   Procedure: BREAST  LUMPECTOMY WITH RADIOACTIVE SEED LOCALIZATION;  Surgeon: Erroll Luna, MD;  Location: [facility];  Service: General;  Laterality: Left;  . CERVICAL BIOPSY  W/ LOOP ELECTRODE EXCISION    . CHOLECYSTECTOMY    . HERNIA REPAIR    . MASTECTOMY, PARTIAL Left 02/15/2015  . NASAL SEPTUM SURGERY    . TONSILLECTOMY AND ADENOIDECTOMY    . UVULECTOMY      Home Medications:  Allergies as of 06/25/2018   No Known Allergies     Medication List       Accurate as of June 25, 2018 12:03 PM. Always use your most recent med list.        amLODipine 10 MG tablet Commonly known as:  NORVASC Take 1 tablet (10 mg total) by mouth daily.     amoxicillin-clavulanate 875-125 MG tablet Commonly known as:  AUGMENTIN Take 1 tablet by mouth 2 (two) times daily.   anastrozole 1 MG tablet Commonly known as:  ARIMIDEX TAKE ONE TABLET BY MOUTH DAILY   fesoterodine 4 MG Tb24 tablet Commonly known as:  TOVIAZ Take 1 tablet (4 mg total) by mouth daily.   fluticasone 50 MCG/ACT nasal spray Commonly known as:  FLONASE Place 2 sprays into both nostrils daily.   hydrochlorothiazide 25 MG tablet Commonly known as:  HYDRODIURIL TAKE ONE TABLET BY MOUTH DAILY   metFORMIN 500 MG 24 hr tablet Commonly known as:  GLUCOPHAGE-XR TAKE THREE TABLETS BY MOUTH DAILY   MOBIC 15 MG tablet Generic drug:  meloxicam Mobic 15 mg tablet  Take 1 tablet every day by oral route.   omeprazole 20 MG capsule Commonly known as:  PRILOSEC Take 20 mg by mouth daily.   oxybutynin 5 MG tablet Commonly known as:  DITROPAN Take 1 tablet (5 mg total) by mouth 3 (three) times daily.   pregabalin 75 MG capsule Commonly known as:  LYRICA pregabalin 75 mg capsule  TAKE ONE CAPSULE BY MOUTH TWICE A DAY   sertraline 100 MG tablet Commonly known as:  ZOLOFT Take 1 tablet (100 mg total) by mouth daily. (increased risk of stomach bleeding with meloxicam or other NSAIDs)       Allergies: No Known Allergies  Family History: Family History  Adopted: Yes  Family history unknown: Yes    Social History:  reports that she has never smoked. She has never used smokeless tobacco. She reports that she does not drink alcohol or use drugs.  ROS: UROLOGY Frequent Urination?: Yes Hard to postpone urination?: No Burning/pain with urination?: No Get up at night to urinate?: No Leakage of urine?: Yes Urine stream starts and stops?: No Trouble starting stream?: No Do you have to strain to urinate?: No Blood in urine?: No Urinary tract infection?: No Sexually transmitted disease?: No Injury to kidneys or bladder?: No Painful intercourse?: No Weak stream?:  No Currently pregnant?: No Vaginal bleeding?: No Last menstrual period?: n  Gastrointestinal Nausea?: No Vomiting?: No Indigestion/heartburn?: No Diarrhea?: No Constipation?: No  Constitutional Fever: No Night sweats?: No Weight loss?: No Fatigue?: No  Skin Skin rash/lesions?: No Itching?: No  Eyes Blurred vision?: No Double vision?: No  Ears/Nose/Throat Sore throat?: No Sinus problems?: No  Hematologic/Lymphatic Swollen glands?: No Easy bruising?: No  Cardiovascular Leg swelling?: No Chest pain?: No  Respiratory Cough?: No Shortness of breath?: No  Endocrine Excessive thirst?: No  Musculoskeletal Back pain?: No Joint pain?: No  Neurological Headaches?: No Dizziness?: No  Psychologic Depression?: No Anxiety?: No  Physical Exam: BP 137/83 (  BP Location: Left Arm, Patient Position: Sitting)   Pulse 80   Ht 5' 9"  (1.753 m)   Wt (!) 351 lb (159.2 kg)   LMP 02/18/2015 (Approximate)   BMI 51.83 kg/m   Constitutional:  Well nourished. Alert and oriented, No acute distress. HEENT: [place] AT, moist mucus membranes.  Trachea midline, no masses. Cardiovascular: No clubbing, cyanosis, or edema. Respiratory: Normal respiratory effort, no increased work of breathing. Neurologic: Grossly intact, no focal deficits, moving all 4 extremities. Psychiatric: Normal mood and affect.   Laboratory Data: Lab Results  Component Value Date   WBC 6.8 11/30/2015   HGB 12.3 11/30/2015   HCT 38.1 11/30/2015   MCV 84.5 11/30/2015   PLT 236 11/30/2015    Lab Results  Component Value Date   CREATININE 1.00 01/21/2018    No results found for: PSA  No results found for: TESTOSTERONE  Lab Results  Component Value Date   HGBA1C 6.3 (H) 01/21/2018    Lab Results  Component Value Date   TSH 4.60 (H) 11/30/2015       Component Value Date/Time   CHOL 167 01/21/2018 1022   CHOL 202 (H) 02/04/2015 0905   HDL 57 01/21/2018 1022   HDL 55 02/04/2015 0905    CHOLHDL 2.9 01/21/2018 1022   VLDL 33 (H) 11/30/2015 1123   LDLCALC 87 01/21/2018 1022    Lab Results  Component Value Date   AST 28 01/21/2018   Lab Results  Component Value Date   ALT 28 01/21/2018   No components found for: ALKALINEPHOPHATASE No components found for: BILIRUBINTOTAL  No results found for: ESTRADIOL  I have reviewed the labs.   Pertinent Imaging: Results for MARTICIA, REIFSCHNEIDER (MRN [id number]) as of 06/25/2018 12:01  Ref. Range 06/25/2018 11:34  Scan Result Unknown 73m     Assessment & Plan:    1. Urgency Toviaz 4 mg has not gotten patient to goal and she is experiencing extreme dry mouth Will give a prescription for oxybutynin 5 mg tid - patient to call with results  2. Incontinence See above  3. Nocturia See above   Return for patient to call with results .  These notes generated with voice recognition software. I apologize for typographical errors.  SZara Council PA-C  [facility] [address] [place] [phone]

## 2018-07-10 ENCOUNTER — Ambulatory Visit
Admission: RE | Admit: 2018-07-10 | Discharge: 2018-07-10 | Disposition: A | Discharge status: Home / Self Care | Payer: Medicaid Other | Source: Ambulatory Visit | Attending: Oncology | Admitting: Oncology

## 2018-07-10 DIAGNOSIS — Z170 Estrogen receptor positive status [ER+]: Secondary | ICD-10-CM | POA: Diagnosis not present

## 2018-07-10 DIAGNOSIS — R928 Other abnormal and inconclusive findings on diagnostic imaging of breast: Secondary | ICD-10-CM | POA: Diagnosis not present

## 2018-07-10 DIAGNOSIS — C50412 Malignant neoplasm of upper-outer quadrant of left female breast: Secondary | ICD-10-CM | POA: Insufficient documentation

## 2018-07-10 DIAGNOSIS — Z853 Personal history of malignant neoplasm of breast: Secondary | ICD-10-CM | POA: Diagnosis not present

## 2018-07-10 HISTORY — DX: Personal history of irradiation: Z92.3

## 2018-07-11 ENCOUNTER — Telehealth: Payer: Self-pay | Admitting: Urology

## 2018-07-11 NOTE — Telephone Encounter (Signed)
We need to send in a prescription for Myrbetriq for her, but we will need to ask her insurance for a formulary exception.  Would you find out how we can do this?

## 2018-07-12 ENCOUNTER — Inpatient Hospital Stay: Payer: Medicaid Other | Admitting: Oncology

## 2018-07-12 ENCOUNTER — Telehealth: Payer: Self-pay | Admitting: *Deleted

## 2018-07-12 NOTE — Telephone Encounter (Signed)
-----   Message from Elizabeth Hoffman sent at 07/12/2018  8:58 AM EST ----- Regarding: Missed Appts x 3 Pt husband called and stated the patient has jury duty today. Patient has r\s 3 appts already. I instructed the husband to have patient call to reschedule appt once she had completed jury duty.  Thank you  Jerene Pitch

## 2018-07-12 NOTE — Telephone Encounter (Signed)
Will await pt. To call back and r/x her appt after she has completed her jury duty.

## 2018-07-15 ENCOUNTER — Other Ambulatory Visit: Payer: Self-pay | Admitting: Urology

## 2018-07-15 MED ORDER — MIRABEGRON ER 25 MG PO TB24: 25.0000 mg | ORAL_TABLET | Freq: Every day | ORAL | 11 refills | Status: DC

## 2018-07-15 NOTE — Progress Notes (Signed)
I have sent a prescription in for the Myrbetriq 25 mg daily to [facility].  We will need to do a prior auth or a letter of medical necessity, so that she can get the medication.

## 2018-07-17 ENCOUNTER — Ambulatory Visit: Payer: Medicaid Other | Admitting: Nurse Practitioner

## 2018-07-17 ENCOUNTER — Encounter: Payer: Self-pay | Admitting: Nurse Practitioner

## 2018-07-17 VITALS — BP 122/80 | HR 94 | Temp 98.3°F | Resp 14 | Ht 68.5 in | Wt 351.8 lb

## 2018-07-17 DIAGNOSIS — R05 Cough: Secondary | ICD-10-CM | POA: Diagnosis not present

## 2018-07-17 DIAGNOSIS — I10 Essential (primary) hypertension: Secondary | ICD-10-CM

## 2018-07-17 DIAGNOSIS — J069 Acute upper respiratory infection, unspecified: Secondary | ICD-10-CM | POA: Diagnosis not present

## 2018-07-17 DIAGNOSIS — R059 Cough, unspecified: Secondary | ICD-10-CM

## 2018-07-17 MED ORDER — GUAIFENESIN ER 600 MG PO TB12: 600.0000 mg | ORAL_TABLET | Freq: Two times a day (BID) | ORAL | 0 refills | Status: DC

## 2018-07-17 MED ORDER — HYDROCODONE-HOMATROPINE 5-1.5 MG/5ML PO SYRP: 5.0000 mL | ORAL_SOLUTION | Freq: Every day | ORAL | 0 refills | Status: DC

## 2018-07-17 NOTE — Patient Instructions (Addendum)
-   Can take hycodan syrup at night and if it does not make you drowsy you can take 4m every 8 hours as needed for cough - Take mucinex DM every 12 hours -  Take daily antihistamine  - If symptoms worsen at any time you develop shortness of breath, fever/chills return or severe fatigue please get medical attention right away.   You likely have a viral upper respiratory infection (URI). Antibiotics will not reduce the number of days you are ill or prevent you from getting bacterial rhinosinusitis. A URI can take up to 14 days to resolve, but typically last between 7-11 days. Your body is so smart and strong that it will be fighting this illness off for you but it is important that you drink plenty of fluids, rest. Cover your nose/mouth when you cough or sneeze and wash your hands well and often. Here are some helpful things you can use or pick up over the counter from the pharmacy to help with your symptoms:   For Fever/Pain: Acetaminophen every 6 hours as needed (maximum of 30060ma day). If you are still uncomfortable you can add ibuprofen OR naproxen  For coughing: try dextromethorphan for a cough suppressant, and/or a cool mist humidifier, lozenges  For sore throat: saline gargles, honey herbal tea, lozenges, throat spray  To dry out your nose: try an antihistamine like loratadine (non-sedating) or diphenhydramine (sedating) or others To relieve a stuffy nose: try flonase, neti pot To make blowing your nose easier and relieve chest congestion: guaifenesin 40074mvery 4-6 hours of guaifenesin ER [phone] mg every 12 hours. Do not take more than 2,400m19mday.

## 2018-07-17 NOTE — Progress Notes (Signed)
Name: Elizabeth Hoffman MRN: [id number] DOB: 11-Sep-1970 Date:07/17/2018 Progress Note Subjective Chief Complaint Chief Complaint  Patient presents with  . URI    no travel symptoms include cough and congestion   HPI   Cough for 10 days, which green-mucous production. Had a low grade fever this weekend and took tylenol. No chills, no recent travels. No sneezing or rhinorrhea. Congestion present. No sore throat, No ear pain. States fevers, body aches, resolved, congestion improving, just lingering congested cough. Denies chest pain, has some mild shortness of breath after coughing episodes.      Patient Active Problem List   Diagnosis Date Noted  . OSA on CPAP 11/03/2016  . History of cervical cancer 02/02/2016  . Hypercoagulable state ([place]) 02/02/2016  . Abnormal uterine bleeding (AUB) 10/02/2015  . Obesity, morbid, BMI 50 or higher ([place]) 07/05/2015  . Malignant neoplasm of upper-outer quadrant of left breast in female, estrogen receptor positive ([place]) 02/22/2015  . Hyperlipidemia LDL goal <100 02/08/2015  . GERD without esophagitis 02/03/2015  . Menorrhagia with irregular cycle 02/03/2015  . Depressive disorder 02/03/2015  . Hypertension goal BP (blood pressure) < 140/90 02/03/2015  . De Quervain's tenosynovitis, bilateral 02/03/2015  . Numerous moles 02/03/2015  . Pre-diabetes 02/03/2015  . History of loop electrosurgical excision procedure (LEEP) of cervix 02/03/2015  . Encounter for screening mammogram for malignant neoplasm of breast 02/03/2015  . Encounter for cholesteral screening for cardiovascular disease 02/03/2015   Past Medical History:  Diagnosis Date  . Anxiety   . Arthritis   . Breast cancer ([place]) 2016  . Depression   . Hot flashes   . Hypertension   . OSA on CPAP 11/03/2016  . Personal history of radiation therapy    Past Surgical History:  Procedure Laterality Date  . ABDOMINAL HYSTERECTOMY  06/15/2017  . BREAST EXCISIONAL BIOPSY Left 03/18/2015   breast  ca +  . BREAST LUMPECTOMY WITH RADIOACTIVE SEED LOCALIZATION Left 03/18/2015   Procedure: BREAST LUMPECTOMY WITH RADIOACTIVE SEED LOCALIZATION;  Surgeon: Erroll Luna, MD;  Location: [facility];  Service: General;  Laterality: Left;  . CERVICAL BIOPSY  W/ LOOP ELECTRODE EXCISION    . CHOLECYSTECTOMY    . HERNIA REPAIR    . MASTECTOMY, PARTIAL Left 02/15/2015  . NASAL SEPTUM SURGERY    . TONSILLECTOMY AND ADENOIDECTOMY    . UVULECTOMY     Social History   Tobacco Use  . Smoking status: Never Smoker  . Smokeless tobacco: Never Used  Substance Use Topics  . Alcohol use: No    Alcohol/week: 0.0 standard drinks    Current Outpatient Medications:  .  amLODipine (NORVASC) 10 MG tablet, Take 1 tablet (10 mg total) by mouth daily., Disp: 90 tablet, Rfl: 3 .  anastrozole (ARIMIDEX) 1 MG tablet, TAKE ONE TABLET BY MOUTH DAILY, Disp: 30 tablet, Rfl: 0 .  fesoterodine (TOVIAZ) 4 MG TB24 tablet, Take 1 tablet (4 mg total) by mouth daily., Disp: 30 tablet, Rfl: 0 .  fluticasone (FLONASE) 50 MCG/ACT nasal spray, Place 2 sprays into both nostrils daily., Disp: 16 g, Rfl: 6 .  hydrochlorothiazide (HYDRODIURIL) 25 MG tablet, TAKE ONE TABLET BY MOUTH DAILY, Disp: 30 tablet, Rfl: 11 .  meloxicam (MOBIC) 15 MG tablet, Mobic 15 mg tablet  Take 1 tablet every day by oral route., Disp: , Rfl:  .  metFORMIN (GLUCOPHAGE-XR) 500 MG 24 hr tablet, TAKE THREE TABLETS BY MOUTH DAILY, Disp: 90 tablet, Rfl: 2 .  omeprazole (PRILOSEC) 20 MG capsule,  Take 20 mg by mouth daily., Disp: , Rfl:  .  pregabalin (LYRICA) 75 MG capsule, pregabalin 75 mg capsule  TAKE ONE CAPSULE BY MOUTH TWICE A DAY, Disp: , Rfl:  .  sertraline (ZOLOFT) 100 MG tablet, Take 1 tablet (100 mg total) by mouth daily. (increased risk of stomach bleeding with meloxicam or other NSAIDs), Disp: 90 tablet, Rfl: 1 .  guaiFENesin (MUCINEX) 600 MG 12 hr tablet, Take 1 tablet (600 mg total) by mouth 2 (two) times daily., Disp: 30 tablet, Rfl:  0 .  HYDROcodone-homatropine (HYCODAN) 5-1.5 MG/5ML syrup, Take 5 mLs by mouth at bedtime., Disp: 50 mL, Rfl: 0 No Known Allergies ROS  No other specific complaints in a complete review of systems (except as listed in HPI above). Objective Vitals:   07/17/18 1348  BP: 122/80  Pulse: 94  Resp: 14  Temp: 98.3 F (36.8 C)  TempSrc: Oral  SpO2: 94%  Weight: (!) 351 lb 12.8 oz (159.6 kg)  Height: 5' 8.5" (1.74 m)    Body mass index is 52.71 kg/m. Nursing Note and Vital Signs reviewed. Physical Exam HENT:     Head: Normocephalic and atraumatic.     Right Ear: Hearing, tympanic membrane, ear canal and external ear normal.     Left Ear: Hearing, tympanic membrane, ear canal and external ear normal.     Nose: Congestion present.     Right Sinus: No maxillary sinus tenderness or frontal sinus tenderness.     Left Sinus: No maxillary sinus tenderness or frontal sinus tenderness.     Mouth/Throat:     Mouth: Mucous membranes are moist.     Pharynx: Uvula midline. No oropharyngeal exudate or posterior oropharyngeal erythema.  Eyes:     General:        Right eye: No discharge.        Left eye: No discharge.     Conjunctiva/sclera: Conjunctivae normal.  Neck:     Musculoskeletal: Normal range of motion.  Cardiovascular:     Rate and Rhythm: Normal rate.  Pulmonary:     Effort: Pulmonary effort is normal.     Breath sounds: Normal breath sounds.     Comments: Congested cough in office  Lymphadenopathy:     Cervical: No cervical adenopathy.  Skin:    General: Skin is warm and dry.     Findings: No rash.  Neurological:     Mental Status: She is alert.  Psychiatric:        Judgment: Judgment normal.     No results found for this or any previous visit (from the past 48 hour(s)). Assessment & Plan 1. Viral upper respiratory tract infection Discussed typical course, if unimproved let us know.  - guaiFENesin (MUCINEX) 600 MG 12 hr tablet; Take 1 tablet (600 mg total) by mouth  2 (two) times daily.  Dispense: 30 tablet; Refill: 0  2. Cough Requesting drowsy medication to help with sleep. PMP aware checked- no suspicious findings.  - guaiFENesin (MUCINEX) 600 MG 12 hr tablet; Take 1 tablet (600 mg total) by mouth 2 (two) times daily.  Dispense: 30 tablet; Refill: 0 - HYDROcodone-homatropine (HYCODAN) 5-1.5 MG/5ML syrup; Take 5 mLs by mouth at bedtime.  Dispense: 50 mL; Refill: 0  ?One month routine follow-up.  -Red flags and when to present for emergency care or RTC including fever >101.15F, chest pain, shortness of breath, new/worsening/un-resolving symptoms,  reviewed with patient at time of visit. Follow up and care instructions discussed and provided in  AVS.

## 2018-07-19 NOTE — Telephone Encounter (Signed)
Left message for patient to return call. Myrbetriq samples set up front for interim until letter of medical necessity is sent to insurance.

## 2018-07-19 NOTE — Telephone Encounter (Signed)
Spoke with patient. She states pharmacy advised that medication was waiting for pick up and was $3.00. Holding off on medical necessity letter at this time.

## 2018-07-28 ENCOUNTER — Other Ambulatory Visit: Payer: Self-pay | Admitting: Oncology

## 2018-08-02 ENCOUNTER — Encounter: Payer: Self-pay | Admitting: Family Medicine

## 2018-08-02 ENCOUNTER — Other Ambulatory Visit: Payer: Self-pay | Admitting: Nurse Practitioner

## 2018-08-02 MED ORDER — AZITHROMYCIN 250 MG PO TABS: ORAL_TABLET | ORAL | 0 refills | Status: DC

## 2018-08-06 ENCOUNTER — Other Ambulatory Visit: Payer: Self-pay

## 2018-08-06 ENCOUNTER — Ambulatory Visit (INDEPENDENT_AMBULATORY_CARE_PROVIDER_SITE_OTHER): Payer: Medicaid Other | Admitting: Podiatry

## 2018-08-06 ENCOUNTER — Encounter: Payer: Self-pay | Admitting: Podiatry

## 2018-08-06 DIAGNOSIS — M7752 Other enthesopathy of left foot: Secondary | ICD-10-CM

## 2018-08-06 DIAGNOSIS — M778 Other enthesopathies, not elsewhere classified: Secondary | ICD-10-CM

## 2018-08-06 DIAGNOSIS — M779 Enthesopathy, unspecified: Principal | ICD-10-CM

## 2018-08-07 NOTE — Progress Notes (Signed)
   HPI: 48 year old female presenting today for follow up evaluation of left foot pain. She states the pain has returned and is the same as it was before. She states the injection she had previously helped alleviate the pain and she is interested in getting another one. She has been taking Diclofenac for a different problem. There are no modifying factors noted. Patient is here for further evaluation and treatment.   Past Medical History:  Diagnosis Date  . Anxiety   . Arthritis   . Breast cancer ([place]) 2016  . Depression   . Hot flashes   . Hypertension   . OSA on CPAP 11/03/2016  . Personal history of radiation therapy      Physical Exam: General: The patient is alert and oriented x3 in no acute distress.  Dermatology: Skin is warm, dry and supple bilateral lower extremities. Negative for open lesions or macerations.  Vascular: Palpable pedal pulses bilaterally. No edema or erythema noted. Capillary refill within normal limits.  Neurological: Epicritic and protective threshold grossly intact bilaterally.   Musculoskeletal Exam: Pain with palpation to the dorsal aspect of the left foot. Range of motion within normal limits to all pedal and ankle joints bilateral. Muscle strength 5/5 in all groups bilateral.   Assessment: 1. Capsulitis left foot   Plan of Care:  1. Patient evaluated.  2. Injection of 0.5 mLs Celestone Soluspan injected into the dorsal left foot.  3. Continue taking Diclofenac from orthopedist.  4. Recommended good shoe gear.  5. Return to clinic as needed.       Edrick Kins, DPM [facility]  Dr. Edrick Kins, DPM    [address], [place] [address]                Office [phone]  Fax [phone]

## 2018-08-10 ENCOUNTER — Other Ambulatory Visit: Payer: Self-pay | Admitting: Oncology

## 2018-08-10 ENCOUNTER — Other Ambulatory Visit: Payer: Self-pay | Admitting: Family Medicine

## 2018-08-18 ENCOUNTER — Telehealth: Payer: Medicaid Other | Admitting: Physician Assistant

## 2018-08-18 DIAGNOSIS — R059 Cough, unspecified: Secondary | ICD-10-CM

## 2018-08-18 DIAGNOSIS — R0602 Shortness of breath: Secondary | ICD-10-CM

## 2018-08-18 DIAGNOSIS — R05 Cough: Secondary | ICD-10-CM

## 2018-08-18 MED ORDER — BENZONATATE 200 MG PO CAPS: 200.0000 mg | ORAL_CAPSULE | Freq: Two times a day (BID) | ORAL | 0 refills | Status: DC | PRN

## 2018-08-18 MED ORDER — ALBUTEROL SULFATE HFA 108 (90 BASE) MCG/ACT IN AERS: 2.0000 | INHALATION_SPRAY | Freq: Four times a day (QID) | RESPIRATORY_TRACT | 0 refills | Status: DC | PRN

## 2018-08-18 NOTE — Progress Notes (Signed)
E-Visit for Corona Virus Screening  Based on your current symptoms, it seems unlikely that your symptoms are related to the Coronavirus.   Coronavirus disease 2019 (COVID-19) is a respiratory illness that can spread from person to person. The virus that causes COVID-19 is a new virus that was first identified in the country of [place] but is now found in multiple other countries and has spread to the [place].  Symptoms associated with the virus are mild to severe fever, cough, and shortness of breath. There is currently no vaccine to protect against COVID-19, and there is no specific antiviral treatment for the virus.   To be considered HIGH RISK for Coronavirus (COVID-19), you have to meet the following criteria:  . Traveled to [place], [place], [place], [place] or [place]; or in the [place] to [place], [place], [place], or [place]; and have fever, cough, and shortness of breath within the last 2 weeks of travel OR  . Been in close contact with a person diagnosed with COVID-19 within the last 2 weeks and have fever, cough, and shortness of breath  . IF YOU DO NOT MEET THESE CRITERIA, YOU ARE CONSIDERED LOW RISK FOR COVID-19.   It is vitally important that if you feel that you have an infection such as this virus or any other virus that you stay home and away from places where you may spread it to others.  You should self-quarantine for 14 days if you have symptoms that could potentially be coronavirus and avoid contact with people age 79 and older.   You can use medication such as A prescription cough medication called Tessalon Perles 100 mg. You may take 1-2 capsules every 8 hours as needed for cough and A prescription inhaler called Albuterol MDI 90 mcg /actuation 2 puffs every 4 hours as needed for shortness of breath, wheezing, cough  You may also take acetaminophen (Tylenol) as needed for fever.   Reduce your risk of any infection by using the same precautions used for  avoiding the common cold or flu:  Marland Kitchen Wash your hands often with soap and warm water for at least 20 seconds.  If soap and water are not readily available, use an alcohol-based hand sanitizer with at least 60% alcohol.  . If coughing or sneezing, cover your mouth and nose by coughing or sneezing into the elbow areas of your shirt or coat, into a tissue or into your sleeve (not your hands). . Avoid shaking hands with others and consider head nods or verbal greetings only. . Avoid touching your eyes, nose, or mouth with unwashed hands.  . Avoid close contact with people who are sick. . Avoid places or events with large numbers of people in one location, like concerts or sporting events. . Carefully consider travel plans you have or are making. . If you are planning any travel outside or inside the [place], visit the CDC's Travelers' Health webpage for the latest health notices. . If you have some symptoms but not all symptoms, continue to monitor at home and seek medical attention if your symptoms worsen. . If you are having a medical emergency, call 911.  HOME CARE . Only take medications as instructed by your medical team. . Drink plenty of fluids and get plenty of rest. . A steam or ultrasonic humidifier can help if you have congestion.   GET HELP RIGHT AWAY IF: . You develop worsening fever. . You become short of breath . You cough up blood. Marland Kitchen  Your symptoms become more severe MAKE SURE YOU   Understand these instructions.  Will watch your condition.  Will get help right away if you are not doing well or get worse.  Your e-visit answers were reviewed by a board certified advanced clinical practitioner to complete your personal care plan.  Depending on the condition, your plan could have included both over the counter or prescription medications.  If there is a problem please reply once you have received a response from your provider. Your safety is important to [place].  If you have drug  allergies check your prescription carefully.    You can use MyChart to ask questions about today's visit, request a non-urgent call back, or ask for a work or school excuse for 24 hours related to this e-Visit. If it has been greater than 24 hours you will need to follow up with your provider, or enter a new e-Visit to address those concerns. You will get an e-mail in the next two days asking about your experience.  I hope that your e-visit has been valuable and will speed your recovery. Thank you for using e-visits.

## 2018-08-19 ENCOUNTER — Encounter: Payer: Self-pay | Admitting: Family Medicine

## 2018-10-22 ENCOUNTER — Other Ambulatory Visit: Payer: Self-pay | Admitting: Family Medicine

## 2018-10-24 ENCOUNTER — Other Ambulatory Visit: Payer: Self-pay | Admitting: Oncology

## 2018-10-24 ENCOUNTER — Other Ambulatory Visit: Payer: Self-pay | Admitting: Family Medicine

## 2018-10-26 ENCOUNTER — Other Ambulatory Visit: Payer: Self-pay | Admitting: Oncology

## 2018-10-26 NOTE — Telephone Encounter (Signed)
Last saw her a year ago/. She needs to se eme before I can refill her prescription

## 2018-10-31 ENCOUNTER — Telehealth: Payer: Self-pay | Admitting: *Deleted

## 2018-10-31 NOTE — Telephone Encounter (Signed)
I have called patient and left message to call me since It has been so long since pt. Been on office she needs to be seen by md before getting refill

## 2018-10-31 NOTE — Telephone Encounter (Signed)
Dr Janese Banks sent me a message to call pt. And schedule f/u. She has not been seen In a while. I called pt. And left her a message. I said that it has been a while since she has been evaluated for her breast cancer and the arimidex she is on. From reviewing her appts. She had f/u in dec 2019 and she called and r/s it to feb 2020. On the date of the appt. Her husband called and said that she had jury duty and needed to cancel the appt and would call to r/s.  If she would call me I will set her up appt for her. I can assist in refill when she calls me back . I gave her my direct number.

## 2018-11-05 ENCOUNTER — Telehealth: Payer: Self-pay | Admitting: *Deleted

## 2018-11-05 NOTE — Telephone Encounter (Signed)
Called pt. And left her a message that she needs to make an appt to see md since she has not been here in so long. She needs be evaluated by md to make sure everything is ok and she can cont. To be on ai for her breast cancer.. left my direct number to call back

## 2018-11-11 DIAGNOSIS — G5623 Lesion of ulnar nerve, bilateral upper limbs: Secondary | ICD-10-CM | POA: Diagnosis not present

## 2018-11-11 DIAGNOSIS — M5412 Radiculopathy, cervical region: Secondary | ICD-10-CM | POA: Diagnosis not present

## 2018-11-11 DIAGNOSIS — G5620 Lesion of ulnar nerve, unspecified upper limb: Secondary | ICD-10-CM | POA: Insufficient documentation

## 2018-11-12 ENCOUNTER — Encounter: Payer: Self-pay | Admitting: Family Medicine

## 2018-11-13 ENCOUNTER — Ambulatory Visit: Payer: Medicaid Other | Admitting: Urology

## 2018-11-18 ENCOUNTER — Other Ambulatory Visit: Payer: Self-pay | Admitting: Family Medicine

## 2018-11-18 NOTE — Telephone Encounter (Signed)
Please schedule routine appointment within 3 months.

## 2018-11-21 DIAGNOSIS — G5603 Carpal tunnel syndrome, bilateral upper limbs: Secondary | ICD-10-CM | POA: Diagnosis not present

## 2018-12-03 ENCOUNTER — Other Ambulatory Visit: Payer: Self-pay | Admitting: Nurse Practitioner

## 2018-12-03 ENCOUNTER — Other Ambulatory Visit: Payer: Self-pay | Admitting: Oncology

## 2018-12-03 ENCOUNTER — Telehealth: Payer: Self-pay

## 2018-12-03 DIAGNOSIS — M7711 Lateral epicondylitis, right elbow: Secondary | ICD-10-CM | POA: Diagnosis not present

## 2018-12-03 DIAGNOSIS — G5603 Carpal tunnel syndrome, bilateral upper limbs: Secondary | ICD-10-CM | POA: Diagnosis not present

## 2018-12-03 NOTE — Progress Notes (Signed)
12/04/2018 8:55 AM   Elizabeth Hoffman Jan 20, 1971 [id number]  Referring provider: Arnetha Courser, MD [address] [place],  [place] [address]  Chief Complaint  Patient presents with  . Nocturia    HPI: [name] is a 48 year old female with a history of urgency, incontinence and nocturia who presents today for follow up.    The patient is experiencing urgency x 4-7 (worse), frequency x 8 or more (unchanged), not restricting fluids to avoid visits to the restroom, is engaging in toilet mapping, incontinence x 4-7 (worse) and nocturia x 0-3 (stable).  Her BP is 163/84.   Her PVR is 3 mL.  She is currently on Myrbetriq 50 mg daily.   [name] was not effective.   She feels that the Myrbetriq has started to "wear off."  She is finding she is having more urgency at this time.   Her main complaints today are frequency, urgency, nocturia and incontinence.  Patient denies any gross hematuria, dysuria or suprapubic/flank pain.  Patient denies any fevers, chills, nausea or vomiting.    PMH: Past Medical History:  Diagnosis Date  . Anxiety   . Arthritis   . Breast cancer ([place]) 2016  . Depression   . Hot flashes   . Hypertension   . OSA on CPAP 11/03/2016  . Personal history of radiation therapy     Surgical History: Past Surgical History:  Procedure Laterality Date  . ABDOMINAL HYSTERECTOMY  06/15/2017  . BREAST EXCISIONAL BIOPSY Left 03/18/2015   breast ca +  . BREAST LUMPECTOMY WITH RADIOACTIVE SEED LOCALIZATION Left 03/18/2015   Procedure: BREAST LUMPECTOMY WITH RADIOACTIVE SEED LOCALIZATION;  Surgeon: Erroll Luna, MD;  Location: [facility];  Service: General;  Laterality: Left;  . CERVICAL BIOPSY  W/ LOOP ELECTRODE EXCISION    . CHOLECYSTECTOMY    . HERNIA REPAIR    . MASTECTOMY, PARTIAL Left 02/15/2015  . NASAL SEPTUM SURGERY    . TONSILLECTOMY AND ADENOIDECTOMY    . UVULECTOMY      Home Medications:  Allergies as of 12/04/2018   No  Known Allergies     Medication List       Accurate as of December 04, 2018  8:55 AM. If you have any questions, ask your nurse or doctor.        STOP taking these medications   albuterol 108 (90 Base) MCG/ACT inhaler Commonly known as: VENTOLIN HFA Stopped by: Lucyle Alumbaugh, PA-C   azithromycin 250 MG tablet Commonly known as: ZITHROMAX Stopped by: Laurice Iglesia, PA-C   benzonatate 200 MG capsule Commonly known as: TESSALON Stopped by: Icela Glymph, PA-C   guaiFENesin 600 MG 12 hr tablet Commonly known as: Mucinex Stopped by: Kamilo Och, PA-C   HYDROcodone-homatropine 5-1.5 MG/5ML syrup Commonly known as: HYCODAN Stopped by: Antoinne Spadaccini, PA-C     TAKE these medications   amLODipine 10 MG tablet Commonly known as: NORVASC TAKE ONE TABLET BY MOUTH DAILY   anastrozole 1 MG tablet Commonly known as: ARIMIDEX TAKE ONE TABLET BY MOUTH DAILY   fesoterodine 4 MG Tb24 tablet Commonly known as: TOVIAZ Take 1 tablet (4 mg total) by mouth daily.   fluticasone 50 MCG/ACT nasal spray Commonly known as: FLONASE Place 2 sprays into both nostrils daily.   hydrochlorothiazide 25 MG tablet Commonly known as: HYDRODIURIL TAKE ONE TABLET BY MOUTH DAILY   metFORMIN 500 MG 24 hr tablet Commonly known as: GLUCOPHAGE-XR TAKE THREE TABLETS BY MOUTH DAILY WITH BREAKFAST  Mobic 15 MG tablet Generic drug: meloxicam Mobic 15 mg tablet  Take 1 tablet every day by oral route.   Myrbetriq 50 MG Tb24 tablet Generic drug: mirabegron ER Take 50 mg by mouth daily.   omeprazole 20 MG capsule Commonly known as: PRILOSEC Take 20 mg by mouth daily.   pregabalin 75 MG capsule Commonly known as: LYRICA pregabalin 75 mg capsule  TAKE ONE CAPSULE BY MOUTH TWICE A DAY   sertraline 100 MG tablet Commonly known as: ZOLOFT TAKE ONE TABLET BY MOUTH DAILY       Allergies: No Known Allergies  Family History: Family History  Adopted: Yes  Family history unknown: Yes     Social History:  reports that she has never smoked. She has never used smokeless tobacco. She reports that she does not drink alcohol or use drugs.  ROS: UROLOGY Frequent Urination?: Yes Hard to postpone urination?: Yes Burning/pain with urination?: No Get up at night to urinate?: Yes Leakage of urine?: Yes Urine stream starts and stops?: No Trouble starting stream?: No Do you have to strain to urinate?: No Blood in urine?: No Urinary tract infection?: No Sexually transmitted disease?: No Injury to kidneys or bladder?: No Painful intercourse?: No Weak stream?: No Currently pregnant?: No Vaginal bleeding?: No Last menstrual period?: n  Gastrointestinal Nausea?: No Vomiting?: No Indigestion/heartburn?: No Diarrhea?: No Constipation?: No  Constitutional Fever: No Night sweats?: Yes Weight loss?: No Fatigue?: No  Skin Skin rash/lesions?: No Itching?: No  Eyes Blurred vision?: No Double vision?: No  Ears/Nose/Throat Sore throat?: No Sinus problems?: No  Hematologic/Lymphatic Swollen glands?: No Easy bruising?: No  Cardiovascular Leg swelling?: No Chest pain?: No  Respiratory Cough?: No Shortness of breath?: No  Endocrine Excessive thirst?: No  Musculoskeletal Back pain?: No Joint pain?: No  Neurological Headaches?: No Dizziness?: No  Psychologic Depression?: No Anxiety?: No  Physical Exam: BP (!) 163/84 (BP Location: Left Arm, Patient Position: Sitting, Cuff Size: Normal)   Pulse 66   Ht 5' 8.5" (1.74 m)   Wt (!) 365 lb (165.6 kg)   LMP 02/18/2015 (Approximate)   BMI 54.69 kg/m   Constitutional:  Well nourished. Alert and oriented, No acute distress. HEENT: [name], moist mucus membranes.  Trachea midline, no masses. Cardiovascular: No clubbing, cyanosis, or edema. Respiratory: Normal respiratory effort, no increased work of breathing. Neurologic: Grossly intact, no focal deficits, moving all 4 extremities. Psychiatric: Normal mood  and affect.   Laboratory Data: Lab Results  Component Value Date   WBC 6.8 11/30/2015   HGB 12.3 11/30/2015   HCT 38.1 11/30/2015   MCV 84.5 11/30/2015   PLT 236 11/30/2015    Lab Results  Component Value Date   CREATININE 1.00 01/21/2018    No results found for: PSA  No results found for: TESTOSTERONE  Lab Results  Component Value Date   HGBA1C 6.3 (H) 01/21/2018    Lab Results  Component Value Date   TSH 4.60 (H) 11/30/2015       Component Value Date/Time   CHOL 167 01/21/2018 1022   CHOL 202 (H) 02/04/2015 0905   HDL 57 01/21/2018 1022   HDL 55 02/04/2015 0905   CHOLHDL 2.9 01/21/2018 1022   VLDL 33 (H) 11/30/2015 1123   LDLCALC 87 01/21/2018 1022    Lab Results  Component Value Date   AST 28 01/21/2018   Lab Results  Component Value Date   ALT 28 01/21/2018   No components found for: ALKALINEPHOPHATASE No components found for:  BILIRUBINTOTAL  No results found for: ESTRADIOL  I have reviewed the labs.   Pertinent Imaging: Results for ABIGAEL, MOGLE (MRN [id number]) as of 12/04/2018 08:37  Ref. Range 12/04/2018 08:33  Scan Result Unknown 3      Assessment & Plan:    1. Urgency She will continue the Myrbetriq 50 mg daily and we will add Toviaz 4 mg daily to see if dual therapy will help her reach her goal We did discuss having an appointment with Dr. Matilde Sprang for further evaluation and management, but she deferred at this time She will reach out via MyChart to update [place] on the effectiveness of dual therapy  2. Incontinence See above  3. Nocturia See above   Return for patient will call with results .  These notes generated with voice recognition software. I apologize for typographical errors.  Zara Council, PA-C  [facility] [address]  [place], [place] [address] [phone]

## 2018-12-03 NOTE — Telephone Encounter (Signed)
As per Message from [place] called [facility]) to cancel Arimidex rx due to patient not being seen in the clinic since June 2019. I called and left voicemail on patient phone that she needs to call and make a follow up appointment with  Dr. Janese Banks to get refill on her medication.

## 2018-12-04 ENCOUNTER — Ambulatory Visit (INDEPENDENT_AMBULATORY_CARE_PROVIDER_SITE_OTHER): Payer: Medicaid Other | Admitting: Urology

## 2018-12-04 ENCOUNTER — Other Ambulatory Visit: Payer: Self-pay

## 2018-12-04 ENCOUNTER — Encounter: Payer: Self-pay | Admitting: Urology

## 2018-12-04 VITALS — BP 163/84 | HR 66 | Ht 68.5 in | Wt 365.0 lb

## 2018-12-04 DIAGNOSIS — N3946 Mixed incontinence: Secondary | ICD-10-CM | POA: Diagnosis not present

## 2018-12-04 DIAGNOSIS — R351 Nocturia: Secondary | ICD-10-CM | POA: Diagnosis not present

## 2018-12-04 DIAGNOSIS — R3915 Urgency of urination: Secondary | ICD-10-CM

## 2018-12-04 LAB — BLADDER SCAN AMB NON-IMAGING: Scan Result: 3

## 2018-12-04 MED ORDER — FESOTERODINE FUMARATE ER 4 MG PO TB24: 4.0000 mg | ORAL_TABLET | Freq: Every day | ORAL | 0 refills | Status: DC

## 2019-01-04 ENCOUNTER — Other Ambulatory Visit: Payer: Self-pay | Admitting: Urology

## 2019-01-06 ENCOUNTER — Other Ambulatory Visit: Payer: Self-pay

## 2019-01-06 DIAGNOSIS — N3946 Mixed incontinence: Secondary | ICD-10-CM

## 2019-01-06 DIAGNOSIS — R351 Nocturia: Secondary | ICD-10-CM

## 2019-01-06 DIAGNOSIS — R3915 Urgency of urination: Secondary | ICD-10-CM

## 2019-01-06 MED ORDER — FESOTERODINE FUMARATE ER 4 MG PO TB24: 4.0000 mg | ORAL_TABLET | Freq: Every day | ORAL | 11 refills | Status: DC

## 2019-01-07 ENCOUNTER — Encounter: Payer: Self-pay | Admitting: Family Medicine

## 2019-01-07 ENCOUNTER — Ambulatory Visit (INDEPENDENT_AMBULATORY_CARE_PROVIDER_SITE_OTHER): Payer: Medicaid Other | Admitting: Family Medicine

## 2019-01-07 ENCOUNTER — Other Ambulatory Visit: Payer: Self-pay

## 2019-01-07 DIAGNOSIS — R591 Generalized enlarged lymph nodes: Secondary | ICD-10-CM

## 2019-01-07 MED ORDER — AMOXICILLIN-POT CLAVULANATE 875-125 MG PO TABS: 1.0000 | ORAL_TABLET | Freq: Two times a day (BID) | ORAL | 0 refills | Status: AC

## 2019-01-07 NOTE — Progress Notes (Signed)
Name: Elizabeth Hoffman   MRN: [id number]    DOB: 02/05/1971   Date:01/07/2019       Progress Note  Subjective  Chief Complaint  Chief Complaint  Patient presents with  . Adenopathy    swollen lymph node for 1 month, painful to swallow    I connected with  [name]  on 01/07/19 at 12:40 PM EDT by a video enabled telemedicine application and verified that I am speaking with the correct person using two identifiers.  I discussed the limitations of evaluation and management by telemedicine and the availability of in person appointments. The patient expressed understanding and agreed to proceed. Staff also discussed with the patient that there may be a patient responsible charge related to this service. Patient Location: Home Provider Location: Home Additional Individuals present: None  HPI  Pt presents with concern for a small "lump" in the upper part of her neck, just below her anterior jaw line on the left side.  Has been present for months, but over the last week it has become sore, and tender to touch and with certain movements. Denies fevers/chills, fatigue, weight changes, body aches, cough. Has never been a smoker.   Patient Active Problem List   Diagnosis Date Noted  . Cervical radiculopathy 05/23/2018  . OSA on CPAP 11/03/2016  . History of cervical cancer 02/02/2016  . Hypercoagulable state ([place]) 02/02/2016  . Abnormal uterine bleeding (AUB) 10/02/2015  . Obesity, morbid, BMI 50 or higher ([place]) 07/05/2015  . Malignant neoplasm of upper-outer quadrant of left breast in female, estrogen receptor positive ([place]) 02/22/2015  . Hyperlipidemia LDL goal <100 02/08/2015  . GERD without esophagitis 02/03/2015  . Menorrhagia with irregular cycle 02/03/2015  . Depressive disorder 02/03/2015  . Hypertension goal BP (blood pressure) < 140/90 02/03/2015  . De Quervain's tenosynovitis, bilateral 02/03/2015  . Numerous moles 02/03/2015  . Pre-diabetes 02/03/2015  . History of  loop electrosurgical excision procedure (LEEP) of cervix 02/03/2015  . Encounter for screening mammogram for malignant neoplasm of breast 02/03/2015  . Encounter for cholesteral screening for cardiovascular disease 02/03/2015    Social History   Tobacco Use  . Smoking status: Never Smoker  . Smokeless tobacco: Never Used  Substance Use Topics  . Alcohol use: No    Alcohol/week: 0.0 standard drinks     Current Outpatient Medications:  .  amLODipine (NORVASC) 10 MG tablet, TAKE ONE TABLET BY MOUTH DAILY, Disp: 90 tablet, Rfl: 0 .  anastrozole (ARIMIDEX) 1 MG tablet, TAKE ONE TABLET BY MOUTH DAILY, Disp: 30 tablet, Rfl: 1 .  fesoterodine (TOVIAZ) 4 MG TB24 tablet, Take 1 tablet (4 mg total) by mouth daily., Disp: 30 tablet, Rfl: 11 .  fluticasone (FLONASE) 50 MCG/ACT nasal spray, Place 2 sprays into both nostrils daily., Disp: 16 g, Rfl: 6 .  hydrochlorothiazide (HYDRODIURIL) 25 MG tablet, TAKE ONE TABLET BY MOUTH DAILY, Disp: 30 tablet, Rfl: 11 .  meloxicam (MOBIC) 15 MG tablet, Mobic 15 mg tablet  Take 1 tablet every day by oral route., Disp: , Rfl:  .  metFORMIN (GLUCOPHAGE-XR) 500 MG 24 hr tablet, TAKE THREE TABLETS BY MOUTH DAILY WITH BREAKFAST, Disp: 180 tablet, Rfl: 0 .  mirabegron ER (MYRBETRIQ) 50 MG TB24 tablet, Take 50 mg by mouth daily., Disp: , Rfl:  .  omeprazole (PRILOSEC) 20 MG capsule, Take 20 mg by mouth daily., Disp: , Rfl:  .  pregabalin (LYRICA) 75 MG capsule, pregabalin 75 mg capsule  TAKE ONE CAPSULE BY  MOUTH TWICE A DAY, Disp: , Rfl:  .  sertraline (ZOLOFT) 100 MG tablet, TAKE ONE TABLET BY MOUTH DAILY, Disp: 90 tablet, Rfl: 0  No Known Allergies  I personally reviewed active problem list, medication list, allergies with the patient/caregiver today.  ROS  Constitutional: Negative for fever or weight change.  Respiratory: Negative for cough and shortness of breath.   Cardiovascular: Negative for chest pain or palpitations.  Gastrointestinal: Negative for  abdominal pain, no bowel changes.  Musculoskeletal: Negative for gait problem or joint swelling.  Skin: Negative for rash.  Neurological: Negative for dizziness or headache.  No other specific complaints in a complete review of systems (except as listed in HPI above).   Objective  Virtual encounter, vitals not obtained.  There is no height or weight on file to calculate BMI.  Nursing Note and Vital Signs reviewed.  Physical Exam Neck:      Constitutional: Patient appears well-developed and well-nourished. No distress.  HENT: Head: Normocephalic and atraumatic. She palpates just below the anterior jaw line to the left side of her neck and described a feeling of a small lump, feels this more internally than with her fingers; denies any noticeable lesion in her oral mucosa.  See above for location. Neck: Normal range of motion. Pulmonary/Chest: Effort normal. No respiratory distress. Speaking in complete sentences Neurological: Pt is alert and oriented to person, place, and time. Coordination, speech and gait are normal.  Psychiatric: Patient has a normal mood and affect. behavior is normal. Judgment and thought content normal.   No results found for this or any previous visit (from the past 72 hour(s)).  Assessment & Plan  1. Lymphadenopathy - We will treat with course of Augmentin to start, if not improving, may refer to gen surgery for further evaluation and possible biopsy.  Did discuss possibility of salivary stones, though she has not oral lesions.  Low suspicion for any thyroid involvement based on location.  She will call or message on MyChart if not improving in 2 weeks after completing Augmentin. - amoxicillin-clavulanate (AUGMENTIN) 875-125 MG tablet; Take 1 tablet by mouth 2 (two) times daily for 10 days.  Dispense: 20 tablet; Refill: 0  -Red flags and when to present for emergency care or RTC including fever >101.46F, chest pain, shortness of breath,  new/worsening/un-resolving symptoms,  reviewed with patient at time of visit. Follow up and care instructions discussed and provided in AVS. - I discussed the assessment and treatment plan with the patient. The patient was provided an opportunity to ask questions and all were answered. The patient agreed with the plan and demonstrated an understanding of the instructions.  I provided 10 minutes of non-face-to-face time during this encounter.  Hubbard Hartshorn, FNP

## 2019-01-13 ENCOUNTER — Encounter: Payer: Self-pay | Admitting: Family Medicine

## 2019-01-16 ENCOUNTER — Encounter: Payer: Self-pay | Admitting: Family Medicine

## 2019-01-16 DIAGNOSIS — K2100 Gastro-esophageal reflux disease with esophagitis, without bleeding: Secondary | ICD-10-CM

## 2019-01-28 ENCOUNTER — Ambulatory Visit (INDEPENDENT_AMBULATORY_CARE_PROVIDER_SITE_OTHER): Payer: Medicaid Other | Admitting: Family Medicine

## 2019-01-28 ENCOUNTER — Other Ambulatory Visit: Payer: Self-pay

## 2019-01-28 ENCOUNTER — Encounter: Payer: Self-pay | Admitting: Family Medicine

## 2019-01-28 DIAGNOSIS — N3000 Acute cystitis without hematuria: Secondary | ICD-10-CM | POA: Diagnosis not present

## 2019-01-28 DIAGNOSIS — R591 Generalized enlarged lymph nodes: Secondary | ICD-10-CM | POA: Diagnosis not present

## 2019-01-28 MED ORDER — NITROFURANTOIN MONOHYD MACRO 100 MG PO CAPS: 100.0000 mg | ORAL_CAPSULE | Freq: Two times a day (BID) | ORAL | 0 refills | Status: AC

## 2019-01-28 NOTE — Progress Notes (Signed)
Name: Elizabeth Hoffman   MRN: [id number]    DOB: 08-Nov-1970   Date:01/28/2019       Progress Note  Subjective  Chief Complaint  Chief Complaint  Patient presents with  . Urinary Tract Infection    burning when urinating, urgency    I connected with  Elizabeth Hoffman  on 01/28/19 at  8:00 AM EDT by a video enabled telemedicine application and verified that I am speaking with the correct person using two identifiers.  I discussed the limitations of evaluation and management by telemedicine and the availability of in person appointments. The patient expressed understanding and agreed to proceed. Staff also discussed with the patient that there may be a patient responsible charge related to this service. Patient Location: Home Provider Location: Office Additional Individuals present: None  HPI  UTI Symptoms:  Complains of dysuria, urinary urgency and frequency for about 3 days.  Denies back or flank pain, no frank hematuria, fevers/chills, has remote history of kidney stones 20 years ago  Lymphadenopathy: She finished Augmentin and the enlarged submandibular lymph node has completely resolved per her report.  No further action.    Patient Active Problem List   Diagnosis Date Noted  . Cervical radiculopathy 05/23/2018  . OSA on CPAP 11/03/2016  . History of cervical cancer 02/02/2016  . Hypercoagulable state ([place]) 02/02/2016  . Abnormal uterine bleeding (AUB) 10/02/2015  . Obesity, morbid, BMI 50 or higher ([place]) 07/05/2015  . Malignant neoplasm of upper-outer quadrant of left breast in female, estrogen receptor positive ([place]) 02/22/2015  . Hyperlipidemia LDL goal <100 02/08/2015  . GERD without esophagitis 02/03/2015  . Menorrhagia with irregular cycle 02/03/2015  . Depressive disorder 02/03/2015  . Hypertension goal BP (blood pressure) < 140/90 02/03/2015  . De Quervain's tenosynovitis, bilateral 02/03/2015  . Numerous moles 02/03/2015  . Pre-diabetes 02/03/2015  . History  of loop electrosurgical excision procedure (LEEP) of cervix 02/03/2015  . Encounter for screening mammogram for malignant neoplasm of breast 02/03/2015  . Encounter for cholesteral screening for cardiovascular disease 02/03/2015    Social History   Tobacco Use  . Smoking status: Never Smoker  . Smokeless tobacco: Never Used  Substance Use Topics  . Alcohol use: No    Alcohol/week: 0.0 standard drinks     Current Outpatient Medications:  .  amLODipine (NORVASC) 10 MG tablet, TAKE ONE TABLET BY MOUTH DAILY, Disp: 90 tablet, Rfl: 0 .  anastrozole (ARIMIDEX) 1 MG tablet, TAKE ONE TABLET BY MOUTH DAILY, Disp: 30 tablet, Rfl: 1 .  fesoterodine (TOVIAZ) 4 MG TB24 tablet, Take 1 tablet (4 mg total) by mouth daily., Disp: 30 tablet, Rfl: 11 .  fluticasone (FLONASE) 50 MCG/ACT nasal spray, Place 2 sprays into both nostrils daily., Disp: 16 g, Rfl: 6 .  hydrochlorothiazide (HYDRODIURIL) 25 MG tablet, TAKE ONE TABLET BY MOUTH DAILY, Disp: 30 tablet, Rfl: 11 .  meloxicam (MOBIC) 15 MG tablet, Mobic 15 mg tablet  Take 1 tablet every day by oral route., Disp: , Rfl:  .  metFORMIN (GLUCOPHAGE-XR) 500 MG 24 hr tablet, TAKE THREE TABLETS BY MOUTH DAILY WITH BREAKFAST, Disp: 180 tablet, Rfl: 0 .  mirabegron ER (MYRBETRIQ) 50 MG TB24 tablet, Take 50 mg by mouth daily., Disp: , Rfl:  .  omeprazole (PRILOSEC) 20 MG capsule, Take 20 mg by mouth daily., Disp: , Rfl:  .  pregabalin (LYRICA) 75 MG capsule, pregabalin 75 mg capsule  TAKE ONE CAPSULE BY MOUTH TWICE A DAY, Disp: , Rfl:  .  sertraline (ZOLOFT) 100 MG tablet, TAKE ONE TABLET BY MOUTH DAILY, Disp: 90 tablet, Rfl: 0  No Known Allergies  I personally reviewed active problem list, medication list, allergies, notes from last encounter with the patient/caregiver today.  ROS  Ten systems reviewed and is negative except as mentioned in HPI  Objective  Virtual encounter, vitals not obtained.  There is no height or weight on file to calculate BMI.   Nursing Note and Vital Signs reviewed.  Physical Exam  Constitutional: Patient appears well-developed and well-nourished. No distress.  HENT: Head: Normocephalic and atraumatic.  Neck: Normal range of motion. Pulmonary/Chest: Effort normal. No respiratory distress. Speaking in complete sentences Neurological: Pt is alert and oriented to person, place, and time. Coordination, speech are normal.  Psychiatric: Patient has a normal mood and affect. behavior is normal. Judgment and thought content normal.  No results found for this or any previous visit (from the past 72 hour(s)).  Assessment & Plan  1. Acute cystitis without hematuria - nitrofurantoin, macrocrystal-monohydrate, (MACROBID) 100 MG capsule; Take 1 capsule (100 mg total) by mouth 2 (two) times daily for 5 days.  Dispense: 10 capsule; Refill: 0 - Will RTC if symptoms fail to improve.  2. Lymphadenopathy - Resolved. Will RTC if this returns   -Red flags and when to present for emergency care or RTC including fever >101.60F, chest pain, shortness of breath, new/worsening/un-resolving symptoms, flank/back pain, frank hematuria reviewed with patient at time of visit. Follow up and care instructions discussed and provided in AVS. - I discussed the assessment and treatment plan with the patient. The patient was provided an opportunity to ask questions and all were answered. The patient agreed with the plan and demonstrated an understanding of the instructions.  I provided 9 minutes of non-face-to-face time during this encounter.  Hubbard Hartshorn, FNP

## 2019-02-06 DIAGNOSIS — M7701 Medial epicondylitis, right elbow: Secondary | ICD-10-CM | POA: Diagnosis not present

## 2019-02-06 DIAGNOSIS — G5603 Carpal tunnel syndrome, bilateral upper limbs: Secondary | ICD-10-CM | POA: Diagnosis not present

## 2019-02-11 ENCOUNTER — Other Ambulatory Visit: Payer: Self-pay | Admitting: Family Medicine

## 2019-02-11 NOTE — Telephone Encounter (Signed)
Requested medication (s) are due for refill today: yes  Requested medication (s) are on the active medication list: yes  Last refill: 12/03/2018  Future visit scheduled: yes  Notes to clinic: review for refill   Requested Prescriptions  Pending Prescriptions Disp Refills   hydrochlorothiazide (HYDRODIURIL) 25 MG tablet [Pharmacy Med Name: hydroCHLOROthiazide 25 MG TABLET] 90 tablet 10    Sig: TAKE ONE TABLET BY MOUTH DAILY     Cardiovascular: Diuretics - Thiazide Failed - 02/11/2019  9:22 AM      Failed - Ca in normal range and within 360 days    Calcium  Date Value Ref Range Status  01/21/2018 9.6 8.6 - 10.2 mg/dL Final  02/24/2015 9.5 8.4 - 10.4 mg/dL Final         Failed - Cr in normal range and within 360 days    Creatinine  Date Value Ref Range Status  02/24/2015 0.9 0.6 - 1.1 mg/dL Final   Creat  Date Value Ref Range Status  01/21/2018 1.00 0.50 - 1.10 mg/dL Final         Failed - K in normal range and within 360 days    Potassium  Date Value Ref Range Status  01/21/2018 4.0 3.5 - 5.3 mmol/L Final  02/24/2015 3.7 3.5 - 5.1 mEq/L Final         Failed - Na in normal range and within 360 days    Sodium  Date Value Ref Range Status  01/21/2018 143 135 - 146 mmol/L Final  02/24/2015 141 136 - 145 mEq/L Final         Failed - Last BP in normal range    BP Readings from Last 1 Encounters:  12/04/18 (!) 163/84         Passed - Valid encounter within last 6 months    Recent Outpatient Visits          2 weeks ago Acute cystitis without hematuria   [place], FNP   1 month ago Lymphadenopathy   [place], FNP   6 months ago Viral upper respiratory tract infection   [place], NP   8 months ago Burning with urination   [place], NP   1 year ago Hyperlipidemia LDL goal <100   [place], Satira Anis, MD      Future Appointments            In 1 week Hubbard Hartshorn, [facility], [facility]

## 2019-02-12 MED ORDER — AMLODIPINE BESYLATE 10 MG PO TABS: 10.0000 mg | ORAL_TABLET | Freq: Every day | ORAL | 0 refills | Status: DC

## 2019-02-12 MED ORDER — METFORMIN HCL ER 500 MG PO TB24: ORAL_TABLET | ORAL | 0 refills | Status: DC

## 2019-02-19 ENCOUNTER — Encounter: Payer: Self-pay | Admitting: Family Medicine

## 2019-02-19 ENCOUNTER — Other Ambulatory Visit: Payer: Self-pay

## 2019-02-19 ENCOUNTER — Ambulatory Visit (INDEPENDENT_AMBULATORY_CARE_PROVIDER_SITE_OTHER): Payer: Medicaid Other | Admitting: Family Medicine

## 2019-02-19 DIAGNOSIS — I10 Essential (primary) hypertension: Secondary | ICD-10-CM | POA: Diagnosis not present

## 2019-02-19 DIAGNOSIS — C50412 Malignant neoplasm of upper-outer quadrant of left female breast: Secondary | ICD-10-CM | POA: Diagnosis not present

## 2019-02-19 DIAGNOSIS — Z170 Estrogen receptor positive status [ER+]: Secondary | ICD-10-CM

## 2019-02-19 DIAGNOSIS — D6859 Other primary thrombophilia: Secondary | ICD-10-CM

## 2019-02-19 DIAGNOSIS — R7303 Prediabetes: Secondary | ICD-10-CM

## 2019-02-19 DIAGNOSIS — E785 Hyperlipidemia, unspecified: Secondary | ICD-10-CM | POA: Diagnosis not present

## 2019-02-19 DIAGNOSIS — K219 Gastro-esophageal reflux disease without esophagitis: Secondary | ICD-10-CM | POA: Diagnosis not present

## 2019-02-19 DIAGNOSIS — G5620 Lesion of ulnar nerve, unspecified upper limb: Secondary | ICD-10-CM

## 2019-02-19 DIAGNOSIS — Z9989 Dependence on other enabling machines and devices: Secondary | ICD-10-CM

## 2019-02-19 DIAGNOSIS — F329 Major depressive disorder, single episode, unspecified: Secondary | ICD-10-CM | POA: Diagnosis not present

## 2019-02-19 DIAGNOSIS — R591 Generalized enlarged lymph nodes: Secondary | ICD-10-CM

## 2019-02-19 DIAGNOSIS — M5412 Radiculopathy, cervical region: Secondary | ICD-10-CM | POA: Diagnosis not present

## 2019-02-19 DIAGNOSIS — Z8541 Personal history of malignant neoplasm of cervix uteri: Secondary | ICD-10-CM | POA: Diagnosis not present

## 2019-02-19 DIAGNOSIS — G4733 Obstructive sleep apnea (adult) (pediatric): Secondary | ICD-10-CM

## 2019-02-19 DIAGNOSIS — F32A Depression, unspecified: Secondary | ICD-10-CM

## 2019-02-19 NOTE — Progress Notes (Signed)
Name: Elizabeth Hoffman   MRN: [id number]    DOB: 1970/07/08   Date:02/19/2019       Progress Note  Subjective  Chief Complaint  Chief Complaint  Patient presents with  . Follow-up  . Medication Refill    I connected with  Elizabeth Hoffman  on 02/19/19 at 10:20 AM EDT by a video enabled telemedicine application and verified that I am speaking with the correct person using two identifiers.  I discussed the limitations of evaluation and management by telemedicine and the availability of in person appointments. The patient expressed understanding and agreed to proceed. Staff also discussed with the patient that there may be a patient responsible charge related to this service. Patient Location: Home Provider Location: Home Additional Individuals present: None  HPI  Carpal Tunnel/Cubital Tunnel Syndrome/Cervical Radiculopathy: Seeing [facility]; Aquilla Solian and Dr. Minerva Ends; taking Lyrica and getting cortisone injections.   HTN: She has checked it periodically at [facility] - has always been normal.  She is on amlodipine 37m, HCTZ 237m denies chest pain, shortness of breath, BLE edema, palpitations.   Depression: She is taking zoloft 10061mhich she feels is very helpful is leveling her mood.  She denies panic attacks, anxiety, SI/HI.   [facility]  0     Breast Cancer:  She is seeing Dr. RaoJanese Bankshe is not taking her arimidex because she has lost follow up with Dr. RaoJanese BanksShe has trouble with transportation while her kids are at home doing virtual learning. She states she is UTD on mammogram.  She will call to try to make appointment when able.    Hx Cervical Cancer/Hx LEEP: Had total hysterectomy about 2 years ago, cervix was removed.   Hypercoagulable State:  Prior to hysterectomy she had a lot of issues with large clots with menses; not issues with clotting otherwise.   Lymphadenopathy: She had LEFT  submandibular lymphadenopathy that was present 01/07/2019, was treated with Augmentin, it resolved, and then returned about 10 days ago.  She does have history of breast CA and Cervical CA, so we will refer to general surgery for evaluation and possible biopsy.  Obesity/Prediabetes: Denies polydipsia, polyphagia, or polyuria.  Does not exercise much; diet is balanced.   OSA: She no longer has her CPAP machine; feels well rested.  She notes that she does snore sometimes; sleeps in her recliner at an angle.  She is not interested in following up with sleep specialist at this time.   GERD: She has upcoming appt with GI at the end of this month - she is taking omeprazole daily to BID.  She is having regurgitation, heartburn; no difficulty swallowing, or abdominal pain, no blood in stool/dark and tarry stools.   Patient Active Problem List   Diagnosis Date Noted  . Cervical radiculopathy 05/23/2018  . OSA on CPAP 11/03/2016  . History of cervical cancer 02/02/2016  . Hypercoagulable state ([facility]9/20/2017  . Abnormal uterine bleeding (AUB) 10/02/2015  . Obesity, morbid, BMI 50 or higher ([facility]2/20/2017  . Malignant neoplasm of upper-outer quadrant of left breast in female, estrogen receptor positive ([facility]CCC[facility]0/02/2015  . Hyperlipidemia LDL goal <100 02/08/2015  . GERD without esophagitis 02/03/2015  . Menorrhagia with irregular cycle 02/03/2015  . Depressive disorder 02/03/2015  . Hypertension goal BP (blood pressure) < 140/90 02/03/2015  . De Quervain's tenosynovitis, bilateral 02/03/2015  . Numerous moles 02/03/2015  . Pre-diabetes 02/03/2015  .  History of loop electrosurgical excision procedure (LEEP) of cervix 02/03/2015  . Encounter for screening mammogram for malignant neoplasm of breast 02/03/2015  . Encounter for cholesteral screening for cardiovascular disease 02/03/2015    Past Surgical History:  Procedure Laterality Date  . ABDOMINAL HYSTERECTOMY  06/15/2017  . BREAST EXCISIONAL  BIOPSY Left 03/18/2015   breast ca +  . BREAST LUMPECTOMY WITH RADIOACTIVE SEED LOCALIZATION Left 03/18/2015   Procedure: BREAST LUMPECTOMY WITH RADIOACTIVE SEED LOCALIZATION;  Surgeon: Erroll Luna, MD;  Location: [facility];  Service: General;  Laterality: Left;  . CERVICAL BIOPSY  W/ LOOP ELECTRODE EXCISION    . CHOLECYSTECTOMY    . HERNIA REPAIR    . MASTECTOMY, PARTIAL Left 02/15/2015  . NASAL SEPTUM SURGERY    . TONSILLECTOMY AND ADENOIDECTOMY    . UVULECTOMY      Family History  Adopted: Yes  Family history unknown: Yes    Social History   Socioeconomic History  . Marital status: Married    Spouse name: Not on file  . Number of children: 3  . Years of education: Not on file  . Highest education level: Not on file  Occupational History  . Not on file  Social Needs  . Financial resource strain: Not on file  . Food insecurity    Worry: Not on file    Inability: Not on file  . Transportation needs    Medical: Not on file    Non-medical: Not on file  Tobacco Use  . Smoking status: Never Smoker  . Smokeless tobacco: Never Used  Substance and Sexual Activity  . Alcohol use: No    Alcohol/week: 0.0 standard drinks  . Drug use: No  . Sexual activity: Yes    Partners: Male    Birth control/protection: I.U.D.  Lifestyle  . Physical activity    Days per week: Not on file    Minutes per session: Not on file  . Stress: Not on file  Relationships  . Social Herbalist on phone: Not on file    Gets together: Not on file    Attends religious service: Not on file    Active member of club or organization: Not on file    Attends meetings of clubs or organizations: Not on file    Relationship status: Not on file  . Intimate partner violence    Fear of current or ex partner: Not on file    Emotionally abused: Not on file    Physically abused: Not on file    Forced sexual activity: Not on file  Other Topics Concern  . Not on file  Social  History Narrative  . Not on file     Current Outpatient Medications:  .  amLODipine (NORVASC) 10 MG tablet, Take 1 tablet (10 mg total) by mouth daily., Disp: 30 tablet, Rfl: 0 .  anastrozole (ARIMIDEX) 1 MG tablet, TAKE ONE TABLET BY MOUTH DAILY, Disp: 30 tablet, Rfl: 1 .  fesoterodine (TOVIAZ) 4 MG TB24 tablet, Take 1 tablet (4 mg total) by mouth daily., Disp: 30 tablet, Rfl: 11 .  fluticasone (FLONASE) 50 MCG/ACT nasal spray, Place 2 sprays into both nostrils daily., Disp: 16 g, Rfl: 6 .  hydrochlorothiazide (HYDRODIURIL) 25 MG tablet, Take 1 tablet (25 mg total) by mouth daily., Disp: 30 tablet, Rfl: 0 .  meloxicam (MOBIC) 15 MG tablet, Mobic 15 mg tablet  Take 1 tablet every day by oral route., Disp: , Rfl:  .  metFORMIN (GLUCOPHAGE-XR) 500 MG 24 hr tablet, TAKE THREE TABLETS BY MOUTH DAILY WITH BREAKFAST, Disp: 90 tablet, Rfl: 0 .  mirabegron ER (MYRBETRIQ) 50 MG TB24 tablet, Take 50 mg by mouth daily., Disp: , Rfl:  .  omeprazole (PRILOSEC) 20 MG capsule, Take 20 mg by mouth daily., Disp: , Rfl:  .  pregabalin (LYRICA) 75 MG capsule, pregabalin 75 mg capsule  TAKE ONE CAPSULE BY MOUTH TWICE A DAY, Disp: , Rfl:  .  sertraline (ZOLOFT) 100 MG tablet, TAKE ONE TABLET BY MOUTH DAILY, Disp: 90 tablet, Rfl: 0  No Known Allergies  I personally reviewed active problem list, medication list, allergies, health maintenance, notes from last encounter, lab results with the patient/caregiver today.   ROS  Constitutional: Negative for fever or weight change.  Respiratory: Negative for cough and shortness of breath.   Cardiovascular: Negative for chest pain or palpitations.  Gastrointestinal: Negative for abdominal pain, no bowel changes.  Musculoskeletal: Negative for gait problem or joint swelling.  Skin: Negative for rash.  Neurological: Negative for dizziness or headache.  No other specific complaints in a complete review of systems (except as listed in HPI above).  Objective  Virtual  encounter, vitals not obtained.  There is no height or weight on file to calculate BMI.  Physical Exam Constitutional: Patient appears well-developed and well-nourished. No distress.  HENT: Head: Normocephalic and atraumatic.  Neck: Normal range of motion. Pulmonary/Chest: Effort normal. No respiratory distress. Speaking in complete sentences Neurological: Pt is alert and oriented to person, place, and time. Coordination, speech and gait are normal.  Psychiatric: Patient has a normal mood and affect. behavior is normal. Judgment and thought content normal.  No results found for this or any previous visit (from the past 72 hour(s)).  PHQ2/9: Depression screen [facility] 2/9 02/19/2019 01/28/2019 01/07/2019 07/17/2018 05/28/2018  Decreased Interest 0 0 0 0 0  Down, Depressed, Hopeless 0 0 0 0 0  PHQ - 2 Score 0 0 0 0 0  Altered sleeping 0 0 0 0 0  Tired, decreased energy 0 0 0 0 0  Change in appetite 0 0 0 0 0  Feeling bad or failure about yourself  0 0 0 0 0  Trouble concentrating 0 0 0 0 0  Moving slowly or fidgety/restless 0 0 0 0 0  Suicidal thoughts 0 0 0 0 0  PHQ-9 Score 0 0 0 0 0  Difficult doing work/chores Not difficult at all Not difficult at all Not difficult at all Not difficult at all Not difficult at all   PHQ-2/9 Result is negative.    Fall Risk: Fall Risk  02/19/2019 01/28/2019 01/07/2019 07/17/2018 05/28/2018  Falls in the past year? 0 0 0 0 0  Number falls in past yr: 0 0 0 0 -  Injury with Fall? 0 0 0 0 -  Follow up Falls evaluation completed Falls evaluation completed Falls evaluation completed - -    Assessment & Plan  1. Hypertension goal BP (blood pressure) < 140/90 - Doing well at current regimen; stable - COMPLETE METABOLIC PANEL WITH GFR  2. Depressive disorder - Stable on Zoloft; does not want to make any changes at this time  3. Hyperlipidemia LDL goal <100 - History is noted in chart, though last 2 years has been at goal. - Lipid panel  4. Malignant  neoplasm of upper-outer quadrant of left breast in female, estrogen receptor positive ([place]) - Needs to follow up with Dr. Janese Banks - she needs to  coordinate child care to get to a follow up appt - strongly recommend she reach out to them to schedule/possibly offer virtual.  5. Pre-diabetes - COMPLETE METABOLIC PANEL WITH GFR - Hemoglobin A1c  6. Obesity, morbid, BMI 50 or higher ([place]) - Discussed importance of 150 minutes of physical activity weekly, eat two servings of fish weekly, eat one serving of tree nuts ( cashews, pistachios, pecans, almonds.Marland Kitchen) every other day, eat 6 servings of fruit/vegetables daily and drink plenty of water and avoid sweet beverages.  - COMPLETE METABOLIC PANEL WITH GFR - Hemoglobin A1c  7. Hypercoagulable state ([place]) - Resolved after hysterectomy  8. History of cervical cancer - Resolved after hysterectomy  9. OSA on CPAP - Declines referral and/or additional sleep stufy - CBC with Differential/Platelet  10. GERD without esophagitis - Seeing GI later this month; advised may take omeprazole BID if needed.  11. Cubital tunnel syndrome, unspecified laterality - Seeing [facility] for care  12. Cervical radiculopathy - Seeing [facility] for care  13. Lymphadenopathy - She has failed antibiotic therapy at this point; she also has history of breast cancer and has been out of her arimidex due to loss of follow up with oncology; advised she will need to see surgeon to evaluate if biopsy is needed or not. - CBC with Differential/Platelet - Ambulatory referral to General Surgery  I discussed the assessment and treatment plan with the patient. The patient was provided an opportunity to ask questions and all were answered. The patient agreed with the plan and demonstrated an understanding of the instructions.  The patient was advised to call back or seek an in-person evaluation if the symptoms worsen or if the condition fails to improve as anticipated.  I  provided 22 minutes of non-face-to-face time during this encounter.

## 2019-02-20 ENCOUNTER — Other Ambulatory Visit: Payer: Self-pay

## 2019-02-21 ENCOUNTER — Ambulatory Visit: Payer: Medicaid Other | Admitting: Family Medicine

## 2019-02-21 MED ORDER — METFORMIN HCL ER 500 MG PO TB24: ORAL_TABLET | ORAL | 1 refills | Status: DC

## 2019-03-04 ENCOUNTER — Encounter: Payer: Self-pay | Admitting: General Surgery

## 2019-03-04 ENCOUNTER — Other Ambulatory Visit: Payer: Self-pay

## 2019-03-04 ENCOUNTER — Ambulatory Visit: Payer: Medicaid Other | Admitting: General Surgery

## 2019-03-04 ENCOUNTER — Ambulatory Visit: Payer: Self-pay

## 2019-03-04 VITALS — BP 138/86 | HR 78 | Temp 98.1°F | Ht 69.0 in | Wt 372.0 lb

## 2019-03-04 DIAGNOSIS — R591 Generalized enlarged lymph nodes: Secondary | ICD-10-CM

## 2019-03-04 NOTE — Patient Instructions (Signed)
   Follow-up with our office as needed.  Please call and ask to speak with a nurse if you develop questions or concerns.  

## 2019-03-04 NOTE — Progress Notes (Signed)
Patient ID: [name], female   DOB: Sep 16, 1970, 48 y.o.   MRN: [id number]  Chief Complaint  Patient presents with  . New Patient (Initial Visit)    Lymphadenopathy    HPI Elizabeth Hoffman is a 48 y.o. female.   She has been referred by her primary care provider, Elizabeth Ensign, FNP, for evaluation of lymphadenopathy.  She reports that she has noticed some swelling just below her left mandible for several months.  Based on my review of electronic medical record, she had a telemedicine visit at the end of August with Ms. Elizabeth Hoffman and was placed on antibiotics.  Ms. Elizabeth Hoffman states that this did not resolve the swelling, although a subsequent telemedicine visit in mid-September with Ms. Elizabeth Hoffman indicates that it actually did take care of the problem.  More recently, in October, another telemedicine visit with Ms. Elizabeth Hoffman states that the swelling returned again about 10 days prior to that visit.  Ms. Elizabeth Hoffman states that she cannot actually feel the swelling on the outside of her neck and that it is more of a sensation on the inside.  She says she notices it when she "makes that face that you make when you put on mascara" but that it is not painful.  She denies any night sweats, unanticipated weight loss, loss of appetite or other B symptoms.  She has not experienced any upper respiratory infection symptoms and she denies any dental problems.  She does have a history of both breast cancer and cervical cancer, and for that reason, it appears that Ms. Elizabeth Hoffman wanted to have her evaluated in surgery clinic.   Past Medical History:  Diagnosis Date  . Anxiety   . Arthritis   . Breast cancer ([place]) 2016  . Depression   . Hot flashes   . Hypertension   . OSA on CPAP 11/03/2016  . Personal history of radiation therapy     Past Surgical History:  Procedure Laterality Date  . ABDOMINAL HYSTERECTOMY  06/15/2017  . BREAST EXCISIONAL BIOPSY Left 03/18/2015   breast ca +  . BREAST LUMPECTOMY WITH RADIOACTIVE  SEED LOCALIZATION Left 03/18/2015   Procedure: BREAST LUMPECTOMY WITH RADIOACTIVE SEED LOCALIZATION;  Surgeon: Elizabeth Luna, MD;  Location: [facility];  Service: General;  Laterality: Left;  . CERVICAL BIOPSY  W/ LOOP ELECTRODE EXCISION    . CHOLECYSTECTOMY    . HERNIA REPAIR    . MASTECTOMY, PARTIAL Left 02/15/2015  . NASAL SEPTUM SURGERY    . TONSILLECTOMY AND ADENOIDECTOMY    . UVULECTOMY      Family History  Adopted: Yes  Family history unknown: Yes    Social History Social History   Tobacco Use  . Smoking status: Never Smoker  . Smokeless tobacco: Never Used  Substance Use Topics  . Alcohol use: No    Alcohol/week: 0.0 standard drinks  . Drug use: No    No Known Allergies  Current Outpatient Medications  Medication Sig Dispense Refill  . amLODipine (NORVASC) 10 MG tablet Take 1 tablet (10 mg total) by mouth daily. 30 tablet 0  . anastrozole (ARIMIDEX) 1 MG tablet TAKE ONE TABLET BY MOUTH DAILY 30 tablet 1  . fesoterodine (TOVIAZ) 4 MG TB24 tablet Take 1 tablet (4 mg total) by mouth daily. 30 tablet 11  . fluticasone (FLONASE) 50 MCG/ACT nasal spray Place 2 sprays into both nostrils daily. 16 g 6  . hydrochlorothiazide (HYDRODIURIL) 25 MG tablet Take 1 tablet (25 mg total) by mouth daily. [place]  tablet 0  . meloxicam (MOBIC) 15 MG tablet Mobic 15 mg tablet  Take 1 tablet every day by oral route.    . metFORMIN (GLUCOPHAGE-XR) 500 MG 24 hr tablet TAKE THREE TABLETS BY MOUTH DAILY WITH BREAKFAST 270 tablet 1  . mirabegron ER (MYRBETRIQ) 50 MG TB24 tablet Take 50 mg by mouth daily.    Marland Kitchen omeprazole (PRILOSEC) 20 MG capsule Take 20 mg by mouth daily.    . pregabalin (LYRICA) 75 MG capsule pregabalin 75 mg capsule  TAKE ONE CAPSULE BY MOUTH TWICE A DAY    . sertraline (ZOLOFT) 100 MG tablet TAKE ONE TABLET BY MOUTH DAILY 90 tablet 0   No current facility-administered medications for this visit.     Review of Systems Review of Systems  Gastrointestinal:        Acid reflux  Musculoskeletal: Positive for arthralgias.  All other systems reviewed and are negative.   Blood pressure 138/86, pulse 78, temperature 98.1 F (36.7 C), height 5' 9"  (1.753 m), weight (!) 372 lb (168.7 kg), last menstrual period 02/18/2015, SpO2 98 %. Body mass index is 54.93 kg/m.   Physical Exam Physical Exam Vitals signs reviewed.  Constitutional:      General: She is not in acute distress.    Appearance: She is obese.  HENT:     Head: Normocephalic and atraumatic.     Nose: Nose normal.     Mouth/Throat:     Mouth: Mucous membranes are moist.     Pharynx: Oropharynx is clear. No oropharyngeal exudate.     Comments: Tonsils are surgically absent.  There are no stones in the sublingual or parotid gland ducts.  No posterior oropharynx erythema or evidence of postnasal discharge. Eyes:     General: No scleral icterus.       Right eye: No discharge.        Left eye: No discharge.  Neck:     Musculoskeletal: No neck rigidity.     Comments: The left submandibular gland is slightly more prominent than the right.  It is nontender.  No palpable cervical or supraclavicular lymphadenopathy. Cardiovascular:     Rate and Rhythm: Normal rate and regular rhythm.     Pulses: Normal pulses.     Heart sounds: Normal heart sounds.  Pulmonary:     Effort: Pulmonary effort is normal.     Breath sounds: Normal breath sounds. No wheezing.  Abdominal:     General: Bowel sounds are normal.     Palpations: Abdomen is soft.     Comments: Protuberant, consistent with her level of obesity.  Genitourinary:    Comments: Deferred Musculoskeletal:        General: No swelling or deformity.  Lymphadenopathy:     Cervical: No cervical adenopathy.  Skin:    General: Skin is warm and dry.  Neurological:     General: No focal deficit present.     Mental Status: She is alert and oriented to person, place, and time.  Psychiatric:        Mood and Affect: Mood normal.        Behavior:  Behavior normal.     Data Reviewed As above, I reviewed the 3 most recent telemedicine clinic notes from [facility].  Assessment This is a 48 year old woman who has noticed some swelling just below the mandible on the left.  As this area is not in the typical drainage basin for either breast or cervical cancer, I do not think this represents  metastatic disease from either source.  She does not have any B symptoms, so I do not think this is suggestive of lymphoma. Physical examination does not reveal any lymphadenopathy.  I performed a bedside ultrasound in clinic today.  Please see separate documentation for full details.  Briefly, the exam was challenging secondary to the patient's body habitus, but no pathologic lymphadenopathy was identified in either lateral compartment of the neck.  The area of patient concern corresponds to the left submandibular gland.    Plan The patient relates reassured today that the findings on evaluation today appear to be normal.  No biopsy or operative intervention is necessary.  I offered the possibility of a referral to otolaryngology, if she desired a second opinion.  She was satisfied with the answers she received in clinic today.  I will see her on an as-needed basis.    Fredirick Maudlin 03/04/2019, 2:56 PM

## 2019-03-06 ENCOUNTER — Ambulatory Visit: Payer: Medicaid Other | Admitting: Gastroenterology

## 2019-03-18 ENCOUNTER — Encounter: Payer: Self-pay | Admitting: Family Medicine

## 2019-03-18 DIAGNOSIS — R070 Pain in throat: Secondary | ICD-10-CM

## 2019-03-18 DIAGNOSIS — R221 Localized swelling, mass and lump, neck: Secondary | ICD-10-CM

## 2019-04-06 ENCOUNTER — Other Ambulatory Visit: Payer: Self-pay | Admitting: Family Medicine

## 2019-04-07 ENCOUNTER — Other Ambulatory Visit: Payer: Self-pay

## 2019-04-07 DIAGNOSIS — R1313 Dysphagia, pharyngeal phase: Secondary | ICD-10-CM | POA: Diagnosis not present

## 2019-04-07 DIAGNOSIS — K219 Gastro-esophageal reflux disease without esophagitis: Secondary | ICD-10-CM | POA: Diagnosis not present

## 2019-04-08 MED ORDER — SERTRALINE HCL 100 MG PO TABS: 100.0000 mg | ORAL_TABLET | Freq: Every day | ORAL | 1 refills | Status: DC

## 2019-04-09 ENCOUNTER — Encounter: Payer: Self-pay | Admitting: Family Medicine

## 2019-04-09 DIAGNOSIS — H524 Presbyopia: Secondary | ICD-10-CM | POA: Diagnosis not present

## 2019-04-09 LAB — HM DIABETES EYE EXAM

## 2019-04-17 DIAGNOSIS — G5603 Carpal tunnel syndrome, bilateral upper limbs: Secondary | ICD-10-CM | POA: Diagnosis not present

## 2019-05-01 ENCOUNTER — Ambulatory Visit: Payer: Medicaid Other | Admitting: Gastroenterology

## 2019-05-19 ENCOUNTER — Other Ambulatory Visit: Payer: Self-pay | Admitting: Emergency Medicine

## 2019-05-19 DIAGNOSIS — R0981 Nasal congestion: Secondary | ICD-10-CM

## 2019-05-19 MED ORDER — FLUTICASONE PROPIONATE 50 MCG/ACT NA SUSP: 2.0000 | Freq: Every day | NASAL | 6 refills | Status: DC

## 2019-06-07 ENCOUNTER — Other Ambulatory Visit: Payer: Self-pay | Admitting: Oncology

## 2019-06-07 ENCOUNTER — Encounter: Payer: Self-pay | Admitting: Family Medicine

## 2019-06-22 DIAGNOSIS — Z20828 Contact with and (suspected) exposure to other viral communicable diseases: Secondary | ICD-10-CM | POA: Diagnosis not present

## 2019-06-26 ENCOUNTER — Telehealth (INDEPENDENT_AMBULATORY_CARE_PROVIDER_SITE_OTHER): Payer: Medicaid Other | Admitting: Internal Medicine

## 2019-06-26 ENCOUNTER — Encounter: Payer: Self-pay | Admitting: Internal Medicine

## 2019-06-26 ENCOUNTER — Other Ambulatory Visit: Payer: Self-pay

## 2019-06-26 VITALS — Ht 69.0 in | Wt 265.0 lb

## 2019-06-26 DIAGNOSIS — B370 Candidal stomatitis: Secondary | ICD-10-CM

## 2019-06-26 DIAGNOSIS — E785 Hyperlipidemia, unspecified: Secondary | ICD-10-CM

## 2019-06-26 DIAGNOSIS — R7303 Prediabetes: Secondary | ICD-10-CM | POA: Diagnosis not present

## 2019-06-26 MED ORDER — NYSTATIN 100000 UNIT/ML MT SUSP: 5.0000 mL | Freq: Four times a day (QID) | OROMUCOSAL | 1 refills | Status: DC

## 2019-06-26 NOTE — Progress Notes (Signed)
Name: Elizabeth Hoffman   MRN: [id number]    DOB: [birth year]   Date:06/26/2019       Progress Note  Subjective  Chief Complaint  Chief Complaint  Patient presents with  . Thrush    tongue, onset 3-4 days ago    I connected with  Elizabeth Hoffman  on 06/26/19 at  7:40 AM EST by a video enabled telemedicine application and verified that I am speaking with the correct person using two identifiers.  I discussed the limitations of evaluation and management by telemedicine and the availability of in person appointments. The patient expressed understanding and agreed to proceed. Staff also discussed with the patient that there may be a patient responsible charge related to this service. Patient Location: Home Provider Location: [facility] Additional Individuals present: none On initial contact, could see the patient but not hear the patient, and asked to look at her time at that point, and then continued the visit by phone.  HPI Patient is a 49 y.o,. female, patient of Elizabeth Hoffman with h/o HTN, pre-DM, Obesity, OSA, depression, cervical/breast CA, GERDand HLD whose last visit with Raquel Sarna was 02/19/2019 with that note reviewed and labs ordered that visit were not completed (with last labs Sept 2019 and last three A1C's in the 6's).  She follows up today with concerns for thrush noted.Had before, tongue is sore, whitish, no fevers, no throat pain and feels like just back of tongue.  She does not feel ill otherwise, with no muscle aches, cough, has not lost her taste or smell.  When she had this before, she was given a mouthwash type product which seemed to help.  She has not been using her Flonase product recently, and uses no inhalers.  Had Covid test this past Sunday and was negative at [facility].  She had the Covid test done as there was a question of an exposure at work.  Lab Results  Component Value Date   HGBA1C 6.3 (H) 01/21/2018   HGBA1C 6.4 (A) 10/05/2017   HGBA1C 6.6 (A) 10/02/2017   Lab Results   Component Value Date   MICROALBUR 0.8 01/21/2018   LDLCALC 87 01/21/2018   CREATININE 1.00 01/21/2018        Patient Active Problem List   Diagnosis Date Noted  . Cubital tunnel syndrome 11/11/2018  . Cervical radiculopathy 05/23/2018  . OSA on CPAP 11/03/2016  . History of cervical cancer 02/02/2016  . Hypercoagulable state ([place]) 02/02/2016  . Abnormal uterine bleeding (AUB) 10/02/2015  . Obesity, morbid, BMI 50 or higher ([place]) 07/05/2015  . Malignant neoplasm of upper-outer quadrant of left breast in female, estrogen receptor positive ([place]) 02/22/2015  . Hyperlipidemia LDL goal <100 02/08/2015  . GERD without esophagitis 02/03/2015  . Menorrhagia with irregular cycle 02/03/2015  . Depressive disorder 02/03/2015  . Hypertension goal BP (blood pressure) < 140/90 02/03/2015  . De Quervain's tenosynovitis, bilateral 02/03/2015  . Numerous moles 02/03/2015  . Pre-diabetes 02/03/2015  . History of loop electrosurgical excision procedure (LEEP) of cervix 02/03/2015    Past Surgical History:  Procedure Laterality Date  . ABDOMINAL HYSTERECTOMY  06/15/2017  . BREAST EXCISIONAL BIOPSY Left 03/18/2015   breast ca +  . BREAST LUMPECTOMY WITH RADIOACTIVE SEED LOCALIZATION Left 03/18/2015   Procedure: BREAST LUMPECTOMY WITH RADIOACTIVE SEED LOCALIZATION;  Surgeon: Erroll Luna, MD;  Location: [place];  Service: General;  Laterality: Left;  . CERVICAL BIOPSY  W/ LOOP ELECTRODE EXCISION    . CHOLECYSTECTOMY    .  HERNIA REPAIR    . MASTECTOMY, PARTIAL Left 02/15/2015  . NASAL SEPTUM SURGERY    . TONSILLECTOMY AND ADENOIDECTOMY    . UVULECTOMY      Family History  Adopted: Yes  Family history unknown: Yes    Social History   Socioeconomic History  . Marital status: Married    Spouse name: Not on file  . Number of children: 3  . Years of education: Not on file  . Highest education level: Not on file  Occupational History  . Not on file  Tobacco Use    . Smoking status: Never Smoker  . Smokeless tobacco: Never Used  Substance and Sexual Activity  . Alcohol use: No    Alcohol/week: 0.0 standard drinks  . Drug use: No  . Sexual activity: Yes    Partners: Male    Birth control/protection: I.U.D.  Other Topics Concern  . Not on file  Social History Narrative  . Not on file   Social Determinants of Health   Financial Resource Strain:   . Difficulty of Paying Living Expenses: Not on file  Food Insecurity:   . Worried About Charity fundraiser in the Last Year: Not on file  . Ran Out of Food in the Last Year: Not on file  Transportation Needs:   . Lack of Transportation (Medical): Not on file  . Lack of Transportation (Non-Medical): Not on file  Physical Activity:   . Days of Exercise per Week: Not on file  . Minutes of Exercise per Session: Not on file  Stress:   . Feeling of Stress : Not on file  Social Connections:   . Frequency of Communication with Friends and Family: Not on file  . Frequency of Social Gatherings with Friends and Family: Not on file  . Attends Religious Services: Not on file  . Active Member of Clubs or Organizations: Not on file  . Attends Archivist Meetings: Not on file  . Marital Status: Not on file  Intimate Partner Violence:   . Fear of Current or Ex-Partner: Not on file  . Emotionally Abused: Not on file  . Physically Abused: Not on file  . Sexually Abused: Not on file     Current Outpatient Medications:  .  amLODipine (NORVASC) 10 MG tablet, TAKE ONE TABLET BY MOUTH DAILY, Disp: 90 tablet, Rfl: 1 .  anastrozole (ARIMIDEX) 1 MG tablet, TAKE ONE TABLET BY MOUTH DAILY, Disp: 30 tablet, Rfl: 1 .  fesoterodine (TOVIAZ) 4 MG TB24 tablet, Take 1 tablet (4 mg total) by mouth daily., Disp: 30 tablet, Rfl: 11 .  fluticasone (FLONASE) 50 MCG/ACT nasal spray, Place 2 sprays into both nostrils daily., Disp: 16 g, Rfl: 6 .  hydrochlorothiazide (HYDRODIURIL) 25 MG tablet, TAKE ONE TABLET BY MOUTH  DAILY, Disp: 90 tablet, Rfl: 1 .  metFORMIN (GLUCOPHAGE-XR) 500 MG 24 hr tablet, TAKE THREE TABLETS BY MOUTH DAILY WITH BREAKFAST, Disp: 270 tablet, Rfl: 1 .  mirabegron ER (MYRBETRIQ) 50 MG TB24 tablet, Take 50 mg by mouth daily., Disp: , Rfl:  .  omeprazole (PRILOSEC) 20 MG capsule, Take 20 mg by mouth daily., Disp: , Rfl:  .  pregabalin (LYRICA) 75 MG capsule, pregabalin 75 mg capsule  TAKE ONE CAPSULE BY MOUTH TWICE A DAY, Disp: , Rfl:  .  sertraline (ZOLOFT) 100 MG tablet, Take 1 tablet (100 mg total) by mouth daily., Disp: 90 tablet, Rfl: 1 .  diclofenac (VOLTAREN) 75 MG EC tablet, , Disp: , Rfl:  .  meloxicam (MOBIC) 15 MG tablet, Mobic 15 mg tablet  Take 1 tablet every day by oral route., Disp: , Rfl:  .  nystatin (MYCOSTATIN) 100000 UNIT/ML suspension, Use as directed 5 mLs (500,000 Units total) in the mouth or throat 4 (four) times daily., Disp: 60 mL, Rfl: 1  No Known Allergies  With staff assistance, above reviewed with the patient today.   ROS: As per HPI, otherwise no specific complaints on a limited and focused system review   Objective  Virtual encounter, vitals not obtained.  Body mass index is 39.13 kg/m.  Physical Exam  Patient appears in NAD HENT: Head: Normocephalic and atraumatic.  Mouth - difficult to see clearly as she showed me her tongue, question of a diffuse pallor over top of tongue, no obvious lesions otherwise  Effort normal. No respiratory distress. Speaking in complete sentences Neurological: Pt is alert and oriented,  Speech is normal.  Psychiatric: Patient has a normal mood and affect, behavior is normal. Very appropriate with conversation, judgment and thought content normal.   No results found for this or any previous visit (from the past 72 hour(s)).  PHQ2/9: Depression screen [facility] 2/9 06/26/2019 02/19/2019 01/28/2019 01/07/2019 07/17/2018  Decreased Interest 0 0 0 0 0  Down, Depressed, Hopeless 0 0 0 0 0  PHQ - 2 Score 0 0 0 0 0  Altered  sleeping 0 0 0 0 0  Tired, decreased energy 0 0 0 0 0  Change in appetite 0 0 0 0 0  Feeling bad or failure about yourself  0 0 0 0 0  Trouble concentrating 0 0 0 0 0  Moving slowly or fidgety/restless 0 0 0 0 0  Suicidal thoughts 0 0 0 0 0  PHQ-9 Score 0 0 0 0 0  Difficult doing work/chores Not difficult at all Not difficult at all Not difficult at all Not difficult at all Not difficult at all  Some recent data might be hidden   PHQ-2/9 Result reviewed - neg  Fall Risk: Fall Risk  06/26/2019 03/04/2019 02/19/2019 01/28/2019 01/07/2019  Falls in the past year? 0 0 0 0 0  Number falls in past yr: 0 0 0 0 0  Injury with Fall? 0 0 0 0 0  Follow up - - Falls evaluation completed Falls evaluation completed Falls evaluation completed     Assessment & Plan  1. Thrush, oral  Do feel that this is a likely possibility.  Do not feel this is related to a possible Covid infection, and did just have testing done this past weekend which was negative.  Did educate on the "Covid tongue "that can occur in the setting of Covid.  Also discussed her diabetes history, and concerns that sometimes this can happen with blood sugars being less well controlled.  She is currently on Metformin for her sugars, and has not had testing done in a while as noted above.  She agreed to do the follow-up testing in the next couple weeks as recommended.  Nystatin swish and swallow prescribed As improving, to follow up for labs ordered (previously be Raquel Sarna and orders are still active)  2. Pre-diabetes  Patient is on Metformin, and will recheck labs here in the next couple weeks.  She will come to the office and recommended fasting before these labs are obtained, not to eat or drink anything after 8:00 the night prior except water, and can show up first thing in the morning to have her labs done.  3. Hyperlipidemia  LDL goal <100  We will obtain a lipid panel with the labs as was ordered by Raquel Sarna previously.  She is to  follow-up sooner if not improving or worsening as well with her recent thrush concerns.  Also discussed if starting to develop any symptoms of concern for Covid, the need to follow-up and may need to be retested.   I discussed the assessment and treatment plan with the patient. The patient was provided an opportunity to ask questions and all were answered. The patient agreed with the plan and demonstrated an understanding of the instructions.  The patient was advised to call back or seek an in-person evaluation if the symptoms worsen or if the condition fails to improve as anticipated.  I provided 16 minutes of non-face-to-face time during this encounter that included discussing at length patient's sx/history, pertinent pmhx, medications, treatment and follow up plan. This time also included the necessary documentation, orders, and chart review.

## 2019-07-08 ENCOUNTER — Ambulatory Visit: Payer: Medicaid Other | Admitting: Gastroenterology

## 2019-07-22 ENCOUNTER — Telehealth: Payer: Self-pay | Admitting: Oncology

## 2019-07-22 NOTE — Telephone Encounter (Signed)
Writer received request from patient to schedule appt with MD. Writer phoned patient to schedule but could not reach patient and left voicemail. Awaiting return call to schedule patient.

## 2019-07-23 ENCOUNTER — Other Ambulatory Visit: Payer: Self-pay | Admitting: Urology

## 2019-07-23 ENCOUNTER — Telehealth: Payer: Self-pay | Admitting: Oncology

## 2019-07-23 NOTE — Telephone Encounter (Signed)
Writer phoned patient and left another voicemail for patient to phone back to schedule an appt.

## 2019-07-25 ENCOUNTER — Telehealth: Payer: Self-pay | Admitting: Family Medicine

## 2019-07-25 NOTE — Telephone Encounter (Signed)
Patient notified PA for Myrbetriq is approved.  PA TLXB:[id number] Dates: 07/25/2019-07/19/2020

## 2019-08-01 ENCOUNTER — Other Ambulatory Visit: Payer: Self-pay | Admitting: Family Medicine

## 2019-08-01 DIAGNOSIS — Z170 Estrogen receptor positive status [ER+]: Secondary | ICD-10-CM

## 2019-08-01 DIAGNOSIS — C50412 Malignant neoplasm of upper-outer quadrant of left female breast: Secondary | ICD-10-CM

## 2019-08-01 DIAGNOSIS — Z1231 Encounter for screening mammogram for malignant neoplasm of breast: Secondary | ICD-10-CM

## 2019-08-05 ENCOUNTER — Ambulatory Visit: Payer: Medicaid Other | Admitting: Podiatry

## 2019-08-05 ENCOUNTER — Ambulatory Visit (INDEPENDENT_AMBULATORY_CARE_PROVIDER_SITE_OTHER): Payer: Medicaid Other

## 2019-08-05 ENCOUNTER — Other Ambulatory Visit: Payer: Self-pay

## 2019-08-05 ENCOUNTER — Encounter: Payer: Self-pay | Admitting: Podiatry

## 2019-08-05 ENCOUNTER — Ambulatory Visit: Payer: Medicaid Other | Admitting: Family Medicine

## 2019-08-05 DIAGNOSIS — M778 Other enthesopathies, not elsewhere classified: Secondary | ICD-10-CM

## 2019-08-05 DIAGNOSIS — M7752 Other enthesopathy of left foot: Secondary | ICD-10-CM

## 2019-08-05 NOTE — Patient Instructions (Signed)
AmLactin is available at the pharmacy  Revitaderm (active ingredient Urea) available online or here in our office

## 2019-08-07 NOTE — Progress Notes (Signed)
   HPI: 49 year old female presenting today for follow up evaluation of throbbing left foot pain. She states that pain flared up about 2-3 weeks ago. She states the pain is worse in the evening when she is seated. She has been taking Tylenol and Diclofenac for treatment.  She also reports thickening and discoloration of the bilateral great toenails that she noticed about 1-2 weeks ago. She denies pain, modifying factors and has not had any treatment for the symptoms. Patient is here for further evaluation and treatment.   Past Medical History:  Diagnosis Date  . Anxiety   . Arthritis   . Breast cancer ([place]) 2016  . Depression   . Hot flashes   . Hypertension   . OSA on CPAP 11/03/2016  . Personal history of radiation therapy      Physical Exam: General: The patient is alert and oriented x3 in no acute distress.  Dermatology: Skin is warm, dry and supple bilateral lower extremities. Negative for open lesions or macerations.  Vascular: Palpable pedal pulses bilaterally. No edema or erythema noted. Capillary refill within normal limits.  Neurological: Epicritic and protective threshold grossly intact bilaterally.   Musculoskeletal Exam: Pain with palpation to the dorsal aspect of the left foot. Hyperkeratotic, discolored, thickened, onychodystrophy noted to the bilateral great toenails. Range of motion within normal limits to all pedal and ankle joints bilateral. Muscle strength 5/5 in all groups bilateral.   Radiographic Exam:  Normal osseous mineralization. Joint spaces preserved. No fracture/dislocation/boney destruction.    Assessment: 1. Capsulitis left foot 2. Dystrophic nails bilateral great toes   Plan of Care:  1. Patient evaluated. X-Rays reviewed.  2. Injection of 0.5 mLs Celestone Soluspan injected into the dorsal left foot.  3. Continue taking Diclofenac from orthopedist.  4. Recommended good shoe gear.  5. Return to clinic as needed.   Daycare teacher standing all  day.     Edrick Kins, DPM [facility]  Dr. Edrick Kins, DPM    [address], [place] [address]                Office [phone]  Fax [phone]

## 2019-08-10 ENCOUNTER — Ambulatory Visit: Payer: Medicaid Other | Attending: Internal Medicine

## 2019-08-10 DIAGNOSIS — Z23 Encounter for immunization: Secondary | ICD-10-CM

## 2019-08-10 NOTE — Progress Notes (Signed)
   Covid-19 Vaccination Clinic  Name:  ARPI DIEBOLD    MRN: [id number] DOB: 04-19-1971  08/10/2019  Ms. Mcaulay was observed post Covid-19 immunization for 15 minutes without incident. She was provided with Vaccine Information Sheet and instruction to access the V-Safe system.   Ms. Garske was instructed to call 911 with any severe reactions post vaccine: Marland Kitchen Difficulty breathing  . Swelling of face and throat  . A fast heartbeat  . A bad rash all over body  . Dizziness and weakness   Immunizations Administered    Name Date Dose VIS Date Route   Pfizer COVID-19 Vaccine 08/10/2019 10:31 AM 0.3 mL 04/25/2019 Intramuscular   Manufacturer: [place]   Lot: [id number]   [place]: [address]-2

## 2019-08-22 ENCOUNTER — Other Ambulatory Visit: Payer: Self-pay | Admitting: Oncology

## 2019-09-02 ENCOUNTER — Ambulatory Visit: Payer: Medicaid Other | Attending: Internal Medicine

## 2019-09-02 DIAGNOSIS — Z23 Encounter for immunization: Secondary | ICD-10-CM

## 2019-09-02 NOTE — Progress Notes (Signed)
   Covid-19 Vaccination Clinic  Name:  Elizabeth Hoffman    MRN: [id number] DOB: December 24, 1970  09/02/2019  [name] was observed post Covid-19 immunization for 15 minutes without incident. She was provided with Vaccine Information Sheet and instruction to access the V-Safe system.   [name] was instructed to call 911 with any severe reactions post vaccine: Marland Kitchen Difficulty breathing  . Swelling of face and throat  . A fast heartbeat  . A bad rash all over body  . Dizziness and weakness   Immunizations Administered    Name Date Dose VIS Date Route   Pfizer COVID-19 Vaccine 09/02/2019  4:17 PM 0.3 mL 07/09/2018 Intramuscular   Manufacturer: [place]   Lot: [id number]   [place]: [id number]

## 2019-09-08 ENCOUNTER — Telehealth (INDEPENDENT_AMBULATORY_CARE_PROVIDER_SITE_OTHER): Payer: Medicaid Other | Admitting: Internal Medicine

## 2019-09-08 ENCOUNTER — Encounter: Payer: Self-pay | Admitting: Internal Medicine

## 2019-09-08 VITALS — Ht 69.0 in | Wt 330.0 lb

## 2019-09-08 DIAGNOSIS — E785 Hyperlipidemia, unspecified: Secondary | ICD-10-CM

## 2019-09-08 DIAGNOSIS — R7303 Prediabetes: Secondary | ICD-10-CM

## 2019-09-08 DIAGNOSIS — H1032 Unspecified acute conjunctivitis, left eye: Secondary | ICD-10-CM

## 2019-09-08 MED ORDER — TOBRAMYCIN 0.3 % OP SOLN: 2.0000 [drp] | OPHTHALMIC | 1 refills | Status: AC

## 2019-09-08 NOTE — Progress Notes (Signed)
Name: Elizabeth Hoffman   MRN: [id number]    DOB: 1971-02-14   Date:09/08/2019       Progress Note  Subjective  Chief Complaint  Chief Complaint  Patient presents with  . Conjunctivitis    onset yesterday evening    I connected with  [name]  on 09/08/19 at  9:00 AM EDT by a video enabled telemedicine application and verified that I am speaking with the correct person using two identifiers.  I discussed the limitations of evaluation and management by telemedicine and the availability of in person appointments. The patient expressed understanding and agreed to proceed. Staff also discussed with the patient that there may be a patient responsible charge related to this service. Patient Location: Home Provider Location: [facility] Additional Individuals present: none  HPI Patient is a 49 y.o,. female, patient of Raelyn Ensign with h/o HTN, pre-DM, Obesity, OSA, depression, cervical/breast CA, GERDand HLD who I first met on a video visit 06/26/2019. At that visit, there were concerns for the possibility of thrush, and also not having had recent labs done for follow-up with her conditions above.  These included a CBC, an U2G, metabolic panel, and a lipid panel that were ordered on her last visit with Raquel Sarna in October 2020. These labs still have not been completed.  Patient presents today with pinkeye concerns.  She notes she has been having some itchy eyes runny nose symptoms in the recent past, typical allergy symptoms that she gets at times, although she noted yesterday, her left eye was starting to feel burning, uncomfortable, and this morning when she awoke, it was crusted shut with some goopy discharge evident.  She states she had to pry it open.  It was a little more red earlier this morning, and seems to have lessened some as the morning has progressed. She denies any fever, increased congestion/PND. No vision decrease or loss of vision.  No photophobia.  No marked pain in the eye. She  does not wear contacts, does occasionally use reading glasses. 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  Patient Active Problem List   Diagnosis Date Noted  . Cubital tunnel syndrome 11/11/2018  . Cervical radiculopathy 05/23/2018  . OSA on CPAP 11/03/2016  . History of cervical cancer 02/02/2016  . Hypercoagulable state ([place]) 02/02/2016  . Abnormal uterine bleeding (AUB) 10/02/2015  . Obesity, morbid, BMI 50 or higher ([place]) 07/05/2015  . Malignant neoplasm of upper-outer quadrant of left breast in female, estrogen receptor positive ([place]) 02/22/2015  . Hyperlipidemia LDL goal <100 02/08/2015  . GERD without esophagitis 02/03/2015  . Menorrhagia with irregular cycle 02/03/2015  . Depressive disorder 02/03/2015  . Hypertension goal BP (blood pressure) < 140/90 02/03/2015  . De Quervain's tenosynovitis, bilateral 02/03/2015  . Numerous moles 02/03/2015  . Pre-diabetes 02/03/2015  . History of loop electrosurgical excision procedure (LEEP) of cervix 02/03/2015    Past Surgical History:  Procedure Laterality Date  . ABDOMINAL HYSTERECTOMY  06/15/2017  . BREAST EXCISIONAL BIOPSY Left 03/18/2015   breast ca +  . BREAST LUMPECTOMY WITH RADIOACTIVE SEED LOCALIZATION Left 03/18/2015   Procedure: BREAST LUMPECTOMY WITH RADIOACTIVE SEED LOCALIZATION;  Surgeon: Erroll Luna, MD;  Location: [place];  Service: General;  Laterality: Left;  . CERVICAL BIOPSY  W/ LOOP ELECTRODE EXCISION    . CHOLECYSTECTOMY    . HERNIA REPAIR    .  MASTECTOMY, PARTIAL Left 02/15/2015  . NASAL SEPTUM SURGERY    . TONSILLECTOMY AND ADENOIDECTOMY    . UVULECTOMY      Family History  Adopted: Yes  Family history unknown: Yes    Social History   Tobacco Use  . Smoking status: Never Smoker  . Smokeless tobacco: Never Used  Substance Use Topics  . Alcohol use: No    Alcohol/week: 0.0  standard drinks     Current Outpatient Medications:  .  amLODipine (NORVASC) 10 MG tablet, TAKE ONE TABLET BY MOUTH DAILY, Disp: 90 tablet, Rfl: 1 .  anastrozole (ARIMIDEX) 1 MG tablet, TAKE ONE TABLET BY MOUTH DAILY, Disp: 30 tablet, Rfl: 1 .  diclofenac (VOLTAREN) 75 MG EC tablet, , Disp: , Rfl:  .  fesoterodine (TOVIAZ) 4 MG TB24 tablet, Take 1 tablet (4 mg total) by mouth daily., Disp: 30 tablet, Rfl: 11 .  fluticasone (FLONASE) 50 MCG/ACT nasal spray, Place 2 sprays into both nostrils daily., Disp: 16 g, Rfl: 6 .  hydrochlorothiazide (HYDRODIURIL) 25 MG tablet, TAKE ONE TABLET BY MOUTH DAILY, Disp: 90 tablet, Rfl: 1 .  metFORMIN (GLUCOPHAGE-XR) 500 MG 24 hr tablet, TAKE THREE TABLETS BY MOUTH DAILY WITH BREAKFAST, Disp: 270 tablet, Rfl: 1 .  MYRBETRIQ 25 MG TB24 tablet, TAKE ONE TABLET BY MOUTH DAILY, Disp: 30 tablet, Rfl: 10 .  omeprazole (PRILOSEC) 20 MG capsule, Take 20 mg by mouth daily., Disp: , Rfl:  .  pregabalin (LYRICA) 75 MG capsule, pregabalin 75 mg capsule  TAKE ONE CAPSULE BY MOUTH TWICE A DAY, Disp: , Rfl:  .  sertraline (ZOLOFT) 100 MG tablet, Take 1 tablet (100 mg total) by mouth daily., Disp: 90 tablet, Rfl: 1 .  meloxicam (MOBIC) 15 MG tablet, Mobic 15 mg tablet  Take 1 tablet every day by oral route., Disp: , Rfl:   No Known Allergies  With staff assistance, above reviewed with the patient today.   ROS: As per HPI, otherwise no specific complaints on a limited and focused system review   Objective  Virtual encounter, vitals not obtained.  Body mass index is 48.73 kg/m.  Physical Exam  Patient appears in NAD HENT: Head: Normocephalic and atraumatic.  The eyes were not markedly injected on assessment via telemedicine, with some faint mild injection very laterally of the left eye, there was no persistent drainage during our visit. Breathing: Effort normal. No respiratory distress. Speaking in complete sentences Neurological: Pt is alert and oriented,  Speech  is normal.  Psychiatric: Patient has a normal mood and affect, behavior is normal. Very appropriate with conversation, judgment and thought content normal.   No results found for this or any previous visit (from the past 72 hour(s)).  PHQ2/9: Depression screen [facility] 2/9 09/08/2019 06/26/2019 02/19/2019 01/28/2019 01/07/2019  Decreased Interest 0 0 0 0 0  Down, Depressed, Hopeless 0 0 0 0 0  PHQ - 2 Score 0 0 0 0 0  Altered sleeping 0 0 0 0 0  Tired, decreased energy 0 0 0 0 0  Change in appetite 0 0 0 0 0  Feeling bad or failure about yourself  0 0 0 0 0  Trouble concentrating 0 0 0 0 0  Moving slowly or fidgety/restless 0 0 0 0 0  Suicidal thoughts 0 0 0 0 0  PHQ-9 Score 0 0 0 0 0  Difficult doing work/chores Not difficult at all Not difficult at all Not difficult at all Not difficult at all Not difficult at all  Some recent data might be hidden   PHQ-2/9 Result reviewed   Fall Risk: Fall Risk  09/08/2019 06/26/2019 03/04/2019 02/19/2019 01/28/2019  Falls in the past year? 0 0 0 0 0  Number falls in past yr: 0 0 0 0 0  Injury with Fall? 0 0 0 0 0  Follow up - - - Falls evaluation completed Falls evaluation completed     Assessment & Plan  1. Acute bacterial conjunctivitis of left eye Discussed, noted concerns for a potential bacterial infection with the goopy type discharge and matted shut component this morning. Felt best to add an antibiotic eyedrop, with tobramycin ophthalmic solution recommended 4 times a day routinely for several days, and should be improving in the next couple days, and if not or worsening, the importance of follow up.  If she has increasing pain, any vision decrease, or more symptomatic throughout the day, will need to see an eye doctor to help and she was understanding of that. Discussed the contagious nature of the discharge, and and that many schools often do not allow students or teachers to the present if they were diagnosed with a pinkeye condition.  Usually,  adults can handle her secretions much better than the children, and if not having significant symptoms can continue to work with the importance of any discharge evident, washing hands vigorously if there is any contact.  2. Pre-diabetes  Patient is on Metformin, and she has not had the repeat labs that were requested after our last visit, and after the her visit with Raquel Sarna months ago.   Again today recommended she come to the office and recommended fasting before these labs are obtained, as noted last visit.  She states she understood and will do so.  3. Hyperlipidemia LDL goal <100  Also will obtain a lipid panel with the labs    I discussed the assessment and treatment plan with the patient. The patient was provided an opportunity to ask questions and all were answered. The patient agreed with the plan and demonstrated an understanding of the instructions.  The patient was advised to call back or seek an in-person evaluation if the symptoms worsen or if the condition fails to improve as anticipated.  I provided 15 minutes of non-face-to-face time during this encounter that included discussing at length patient's sx/history, pertinent pmhx, medications, treatment and follow up plan. This time also included the necessary documentation, orders, and chart review.

## 2019-09-14 ENCOUNTER — Other Ambulatory Visit: Payer: Self-pay | Admitting: Oncology

## 2019-09-14 ENCOUNTER — Other Ambulatory Visit: Payer: Self-pay | Admitting: Family Medicine

## 2019-09-15 NOTE — Telephone Encounter (Signed)
Requested Prescriptions  Pending Prescriptions Disp Refills  . sertraline (ZOLOFT) 100 MG tablet [Pharmacy Med Name: SERTRALINE HCL 100 MG TABLET] 90 tablet 0    Sig: TAKE ONE TABLET BY MOUTH DAILY     Psychiatry:  Antidepressants - SSRI Passed - 09/14/2019 10:21 PM      Passed - Completed PHQ-2 or PHQ-9 in the last 360 days.      Passed - Valid encounter within last 6 months    Recent Outpatient Visits          1 week ago Acute bacterial conjunctivitis of left eye   [facility] Lebron Conners D, MD   2 months ago Ritta Slot, oral   [facility] Lebron Conners D, MD   6 months ago Hypertension goal BP (blood pressure) < 140/90   [place], FNP   7 months ago Acute cystitis without hematuria   [place], [place], FNP   8 months ago Lymphadenopathy   [place], [place]             . hydrochlorothiazide (HYDRODIURIL) 25 MG tablet [Pharmacy Med Name: hydroCHLOROthiazide 25 MG TABLET] 90 tablet 0    Sig: TAKE ONE TABLET BY MOUTH DAILY     Cardiovascular: Diuretics - Thiazide Failed - 09/14/2019 10:21 PM      Failed - Ca in normal range and within 360 days    Calcium  Date Value Ref Range Status  01/21/2018 9.6 8.6 - 10.2 mg/dL Final  02/24/2015 9.5 8.4 - 10.4 mg/dL Final         Failed - Cr in normal range and within 360 days    Creatinine  Date Value Ref Range Status  02/24/2015 0.9 0.6 - 1.1 mg/dL Final   Creat  Date Value Ref Range Status  01/21/2018 1.00 0.50 - 1.10 mg/dL Final   Creatinine, Urine  Date Value Ref Range Status  01/21/2018 252 20 - 275 mg/dL Final         Failed - K in normal range and within 360 days    Potassium  Date Value Ref Range Status  01/21/2018 4.0 3.5 - 5.3 mmol/L Final  02/24/2015 3.7 3.5 - 5.1 mEq/L Final         Failed - Na in normal range and within 360 days    Sodium  Date Value Ref  Range Status  01/21/2018 143 135 - 146 mmol/L Final  02/24/2015 141 136 - 145 mEq/L Final         Passed - Last BP in normal range    BP Readings from Last 1 Encounters:  03/04/19 138/86         Passed - Valid encounter within last 6 months    Recent Outpatient Visits          1 week ago Acute bacterial conjunctivitis of left eye   [facility] Towanda Malkin, MD   2 months ago Ritta Slot, oral   [facility] Lebron Conners D, MD   6 months ago Hypertension goal BP (blood pressure) < 140/90   [place], FNP   7 months ago Acute cystitis without hematuria   [place], FNP   8 months ago Lymphadenopathy   [place], Astrid Divine, [place]             .  amLODipine (NORVASC) 10 MG tablet [Pharmacy Med Name: amLODIPine BESYLATE 10 MG TAB] 90 tablet 0    Sig: TAKE ONE TABLET BY MOUTH DAILY     Cardiovascular:  Calcium Channel Blockers Passed - 09/14/2019 10:21 PM      Passed - Last BP in normal range    BP Readings from Last 1 Encounters:  03/04/19 138/86         Passed - Valid encounter within last 6 months    Recent Outpatient Visits          1 week ago Acute bacterial conjunctivitis of left eye   [facility] Towanda Malkin, MD   2 months ago Ritta Slot, oral   [facility] Lebron Conners D, MD   6 months ago Hypertension goal BP (blood pressure) < 140/90   [place], FNP   7 months ago Acute cystitis without hematuria   [place], FNP   8 months ago Lymphadenopathy   [facility] [place], Raquel Sarna E, [place]             . metFORMIN (GLUCOPHAGE-XR) 500 MG 24 hr tablet [Pharmacy Med Name: metFORMIN HCL XR 500 MG TABLET] 270 tablet 0    Sig: [facility]      Endocrinology:  Diabetes - Biguanides Failed - 09/14/2019 10:21 PM      Failed - Cr in normal range and within 360 days    Creatinine  Date Value Ref Range Status  02/24/2015 0.9 0.6 - 1.1 mg/dL Final   Creat  Date Value Ref Range Status  01/21/2018 1.00 0.50 - 1.10 mg/dL Final   Creatinine, Urine  Date Value Ref Range Status  01/21/2018 252 20 - 275 mg/dL Final         Failed - HBA1C is between 0 and 7.9 and within 180 days    Hgb A1c MFr Bld  Date Value Ref Range Status  01/21/2018 6.3 (H) <5.7 % of total Hgb Final    Comment:    For someone without known diabetes, a hemoglobin  A1c value between 5.7% and 6.4% is consistent with prediabetes and should be confirmed with a  follow-up test. . For someone with known diabetes, a value <7% indicates that their diabetes is well controlled. A1c targets should be individualized based on duration of diabetes, age, comorbid conditions, and other considerations. . This assay result is consistent with an increased risk of diabetes. . Currently, no consensus exists regarding use of hemoglobin A1c for diagnosis of diabetes for children. .          Failed - eGFR in normal range and within 360 days    GFR, Est African American  Date Value Ref Range Status  01/21/2018 78 > OR = 60 mL/min/1.12m Final   GFR, Est Non African American  Date Value Ref Range Status  01/21/2018 68 > OR = 60 mL/min/1.772mFinal   EGFR  Date Value Ref Range Status  02/24/2015 77 (L) >90 ml/min/1.73 m2 Final    Comment:    eGFR is calculated using the CKD-EPI Creatinine Equation (2009)         Passed - Valid encounter within last 6 months    Recent Outpatient Visits          1 week [facility]  2 months ago [place], oral   [facility] Lebron Conners D, MD   6 months ago Hypertension goal BP (blood pressure) < 140/90    [place], [place]   7 months ago Acute cystitis without hematuria   [place], FNP   8 months ago Lymphadenopathy   [place], Astrid Divine, [place]

## 2019-10-07 DIAGNOSIS — G5603 Carpal tunnel syndrome, bilateral upper limbs: Secondary | ICD-10-CM | POA: Diagnosis not present

## 2019-10-15 ENCOUNTER — Other Ambulatory Visit: Payer: Self-pay | Admitting: Oncology

## 2019-10-24 ENCOUNTER — Other Ambulatory Visit: Payer: Self-pay

## 2019-10-24 DIAGNOSIS — B370 Candidal stomatitis: Secondary | ICD-10-CM

## 2019-10-24 DIAGNOSIS — R070 Pain in throat: Secondary | ICD-10-CM

## 2019-12-08 ENCOUNTER — Other Ambulatory Visit: Payer: Self-pay

## 2019-12-08 MED ORDER — SERTRALINE HCL 100 MG PO TABS: 100.0000 mg | ORAL_TABLET | Freq: Every day | ORAL | 1 refills | Status: DC

## 2019-12-08 MED ORDER — HYDROCHLOROTHIAZIDE 25 MG PO TABS: 25.0000 mg | ORAL_TABLET | Freq: Every day | ORAL | 1 refills | Status: DC

## 2019-12-08 MED ORDER — AMLODIPINE BESYLATE 10 MG PO TABS: 10.0000 mg | ORAL_TABLET | Freq: Every day | ORAL | 1 refills | Status: DC

## 2019-12-08 MED ORDER — METFORMIN HCL ER 500 MG PO TB24: ORAL_TABLET | ORAL | 1 refills | Status: DC

## 2019-12-08 NOTE — Telephone Encounter (Signed)
She has not had labs done since late 2019, ordered in Oct 2020 and not done and rec'ed again on f/u visits by phone to heve these done and she has not. The orders remain active. I will refill the meds as worried more if stops taking than to continue, but please contact the patient to let her know she needs the labs completed and a f/u visit in the near future as well or I cannot continue to refill the meds in the future.

## 2019-12-29 ENCOUNTER — Other Ambulatory Visit: Payer: Self-pay | Admitting: Urology

## 2019-12-29 DIAGNOSIS — R351 Nocturia: Secondary | ICD-10-CM

## 2019-12-29 DIAGNOSIS — R3915 Urgency of urination: Secondary | ICD-10-CM

## 2019-12-29 DIAGNOSIS — N3946 Mixed incontinence: Secondary | ICD-10-CM

## 2020-01-01 ENCOUNTER — Encounter: Payer: Self-pay | Admitting: Internal Medicine

## 2020-01-02 ENCOUNTER — Other Ambulatory Visit: Payer: Self-pay | Admitting: Oncology

## 2020-01-06 ENCOUNTER — Telehealth: Payer: Self-pay

## 2020-01-06 NOTE — Telephone Encounter (Signed)
Left message with [facility] to return my call.   Copied from [place] [phone]. Topic: General - Other >> Jan 05, 2020  3:59 PM Leward Quan A wrote: Reason for CRM: Robin with [facility] called to inform Dr Roxan Hockey that he ran patients medication refills through the [facility] system and is getting a kickback saying that as of June 2021 Dr is not registered with Medicaid. Please advise patient in need of her medication.

## 2020-01-07 ENCOUNTER — Other Ambulatory Visit: Payer: Self-pay

## 2020-01-07 MED ORDER — SERTRALINE HCL 100 MG PO TABS
100.0000 mg | ORAL_TABLET | Freq: Every day | ORAL | 1 refills | Status: DC
Start: 2020-01-07 — End: 2020-09-02

## 2020-01-07 MED ORDER — AMLODIPINE BESYLATE 10 MG PO TABS: 10.0000 mg | ORAL_TABLET | Freq: Every day | ORAL | 1 refills | Status: DC

## 2020-01-07 MED ORDER — HYDROCHLOROTHIAZIDE 25 MG PO TABS: 25.0000 mg | ORAL_TABLET | Freq: Every day | ORAL | 1 refills | Status: DC

## 2020-01-07 MED ORDER — METFORMIN HCL ER 500 MG PO TB24: ORAL_TABLET | ORAL | 1 refills | Status: DC

## 2020-01-08 ENCOUNTER — Other Ambulatory Visit: Payer: Self-pay

## 2020-01-08 DIAGNOSIS — N3946 Mixed incontinence: Secondary | ICD-10-CM

## 2020-01-08 DIAGNOSIS — R351 Nocturia: Secondary | ICD-10-CM

## 2020-01-08 DIAGNOSIS — R3915 Urgency of urination: Secondary | ICD-10-CM

## 2020-01-08 NOTE — Telephone Encounter (Signed)
Patient left a message requesting a refill of Toviaz. Patient has not been seen since 11/2018. Left patient a vmail to return call to make a follow up appt for medication refill

## 2020-02-04 ENCOUNTER — Telehealth: Payer: Self-pay | Admitting: Urology

## 2020-02-04 DIAGNOSIS — N3946 Mixed incontinence: Secondary | ICD-10-CM

## 2020-02-04 DIAGNOSIS — R351 Nocturia: Secondary | ICD-10-CM

## 2020-02-04 DIAGNOSIS — R3915 Urgency of urination: Secondary | ICD-10-CM

## 2020-02-04 NOTE — Telephone Encounter (Signed)
This pt. Called to make an appointment and ask if her medication can be refilled before her appointment and if not then call her to let her know.

## 2020-02-05 MED ORDER — FESOTERODINE FUMARATE ER 4 MG PO TB24: 4.0000 mg | ORAL_TABLET | Freq: Every day | ORAL | 0 refills | Status: DC

## 2020-02-05 NOTE — Telephone Encounter (Signed)
A 30-day supply to get her to the appointment will be fine.

## 2020-02-05 NOTE — Telephone Encounter (Signed)
RX sent to pharmacy and patient notified.

## 2020-02-06 DIAGNOSIS — E119 Type 2 diabetes mellitus without complications: Secondary | ICD-10-CM | POA: Diagnosis not present

## 2020-02-06 DIAGNOSIS — F419 Anxiety disorder, unspecified: Secondary | ICD-10-CM | POA: Diagnosis not present

## 2020-02-06 DIAGNOSIS — I10 Essential (primary) hypertension: Secondary | ICD-10-CM | POA: Diagnosis not present

## 2020-02-06 DIAGNOSIS — K219 Gastro-esophageal reflux disease without esophagitis: Secondary | ICD-10-CM | POA: Diagnosis not present

## 2020-02-06 DIAGNOSIS — G4733 Obstructive sleep apnea (adult) (pediatric): Secondary | ICD-10-CM | POA: Diagnosis not present

## 2020-02-06 DIAGNOSIS — Z7984 Long term (current) use of oral hypoglycemic drugs: Secondary | ICD-10-CM | POA: Diagnosis not present

## 2020-02-06 DIAGNOSIS — Z853 Personal history of malignant neoplasm of breast: Secondary | ICD-10-CM | POA: Diagnosis not present

## 2020-02-06 DIAGNOSIS — G5601 Carpal tunnel syndrome, right upper limb: Secondary | ICD-10-CM | POA: Diagnosis not present

## 2020-02-16 NOTE — Progress Notes (Deleted)
02/17/2020 4:03 PM   Elizabeth Hoffman [birth year] [id number]  Referring provider: Towanda Malkin, MD [address] [place],  [place] [address]  No chief complaint on file.   HPI: Elizabeth Hoffman is a 49 year old female with urgency, incontinence and nocturia who presents today for follow up.    The patient is experiencing urgency x 4-7 (worse), frequency x 8 or more (unchanged), not restricting fluids to avoid visits to the restroom, is engaging in toilet mapping, incontinence x 4-7 (worse) and nocturia x 0-3 (stable).  Her BP is 163/84.   Her PVR is 3 mL.  She is currently on Myrbetriq 50 mg daily.   Elizabeth Hoffman was not effective.   She feels that the Myrbetriq has started to "wear off."  She is finding she is having more urgency at this time.     PMH: Past Medical History:  Diagnosis Date  . Anxiety   . Arthritis   . Breast cancer (Elizabeth Hoffman) 2016  . Depression   . Hot flashes   . Hypertension   . OSA on CPAP 11/03/2016  . Personal history of radiation therapy     Surgical History: Past Surgical History:  Procedure Laterality Date  . ABDOMINAL HYSTERECTOMY  06/15/2017  . BREAST EXCISIONAL BIOPSY Left 03/18/2015   breast ca +  . BREAST LUMPECTOMY WITH RADIOACTIVE SEED LOCALIZATION Left 03/18/2015   Procedure: BREAST LUMPECTOMY WITH RADIOACTIVE SEED LOCALIZATION;  Surgeon: Elizabeth Luna, MD;  Location: [place];  Service: General;  Laterality: Left;  . CERVICAL BIOPSY  W/ LOOP ELECTRODE EXCISION    . CHOLECYSTECTOMY    . HERNIA REPAIR    . MASTECTOMY, PARTIAL Left 02/15/2015  . NASAL SEPTUM SURGERY    . TONSILLECTOMY AND ADENOIDECTOMY    . UVULECTOMY      Home Medications:  Allergies as of 02/17/2020   No Known Allergies     Medication List       Accurate as of February 16, 2020  4:03 PM. If you have any questions, ask your nurse or doctor.        amLODipine 10 MG tablet Commonly known as: NORVASC Take 1 tablet (10 mg total) by  mouth daily.   anastrozole 1 MG tablet Commonly known as: ARIMIDEX TAKE ONE TABLET BY MOUTH DAILY   diclofenac 75 MG EC tablet Commonly known as: VOLTAREN   fesoterodine 4 MG Tb24 tablet Commonly known as: Toviaz Take 1 tablet (4 mg total) by mouth daily.   fluticasone 50 MCG/ACT nasal spray Commonly known as: FLONASE Place 2 sprays into both nostrils daily.   hydrochlorothiazide 25 MG tablet Commonly known as: HYDRODIURIL Take 1 tablet (25 mg total) by mouth daily.   metFORMIN 500 MG 24 hr tablet Commonly known as: GLUCOPHAGE-XR Take 3 tablets by mouth daily with Breakfast   Mobic 15 MG tablet Generic drug: meloxicam Mobic 15 mg tablet  Take 1 tablet every day by oral route.   Myrbetriq 25 MG Tb24 tablet Generic drug: mirabegron ER TAKE ONE TABLET BY MOUTH DAILY   omeprazole 20 MG capsule Commonly known as: PRILOSEC Take 20 mg by mouth daily.   pregabalin 75 MG capsule Commonly known as: Elizabeth Hoffman pregabalin 75 mg capsule  TAKE ONE CAPSULE BY MOUTH TWICE A DAY   sertraline 100 MG tablet Commonly known as: ZOLOFT Take 1 tablet (100 mg total) by mouth daily.       Allergies: No Known Allergies  Family History: Family History  Adopted: Yes  Family history unknown: Yes    Social History:  reports that she has never smoked. She has never used smokeless tobacco. She reports that she does not drink alcohol and does not use drugs.  ROS: For pertinent review of systems please refer to history of present illness  Physical Exam: LMP 02/18/2015 (Approximate)   Constitutional:  Well nourished. Alert and oriented, No acute distress. HEENT: Elizabeth Hoffman AT, moist mucus membranes.  Trachea midline, no masses. Cardiovascular: No clubbing, cyanosis, or edema. Respiratory: Normal respiratory effort, no increased work of breathing. GI: Abdomen is soft, non tender, non distended, no abdominal masses. Liver and spleen not palpable.  No hernias appreciated.  Stool sample for occult  testing is not indicated.   GU: No CVA tenderness.  No bladder fullness or masses.  *** external genitalia, *** pubic hair distribution, no lesions.  Normal urethral meatus, no lesions, no prolapse, no discharge.   No urethral masses, tenderness and/or tenderness. No bladder fullness, tenderness or masses. *** vagina mucosa, *** estrogen effect, no discharge, no lesions, *** pelvic support, *** cystocele and *** rectocele noted.  No cervical motion tenderness.  Uterus is freely mobile and non-fixed.  No adnexal/parametria masses or tenderness noted.  Anus and perineum are without rashes or lesions.   ***  Skin: No rashes, bruises or suspicious lesions. Lymph: No cervical or inguinal adenopathy. Neurologic: Grossly intact, no focal deficits, moving all 4 extremities. Psychiatric: Normal mood and affect.   Laboratory Data: Lab Results  Component Value Date   WBC 6.8 11/30/2015   HGB 12.3 11/30/2015   HCT 38.1 11/30/2015   MCV 84.5 11/30/2015   PLT 236 11/30/2015    Lab Results  Component Value Date   CREATININE 1.00 01/21/2018    No results found for: PSA  No results found for: TESTOSTERONE  Lab Results  Component Value Date   HGBA1C 6.3 (H) 01/21/2018    Lab Results  Component Value Date   TSH 4.60 (H) 11/30/2015       Component Value Date/Time   CHOL 167 01/21/2018 1022   CHOL 202 (H) 02/04/2015 0905   HDL 57 01/21/2018 1022   HDL 55 02/04/2015 0905   CHOLHDL 2.9 01/21/2018 1022   VLDL 33 (H) 11/30/2015 1123   LDLCALC 87 01/21/2018 1022    Lab Results  Component Value Date   AST 28 01/21/2018   Lab Results  Component Value Date   ALT 28 01/21/2018   No components found for: ALKALINEPHOPHATASE No components found for: BILIRUBINTOTAL  No results found for: ESTRADIOL  I have reviewed the labs.   Pertinent Imaging: Results for Elizabeth Hoffman, Elizabeth Hoffman (MRN [id number]) as of 12/04/2018 08:37  Ref. Range 12/04/2018 08:33  Scan Result Unknown 3      Assessment &  Plan:    1. Urgency She will continue the Myrbetriq 50 mg daily and we will add Toviaz 4 mg daily to see if dual therapy will help her reach her goal We did discuss having an appointment with Elizabeth Hoffman for further evaluation and management, but she deferred at this time She will reach out via MyChart to update [place] on the effectiveness of dual therapy  2. Incontinence See above  3. Nocturia See above   No follow-ups on file.  These notes generated with voice recognition software. I apologize for typographical errors.  Zara Council, PA-C  [facility] [address]  [place], [place] [address] [phone]

## 2020-02-17 ENCOUNTER — Encounter: Payer: Self-pay | Admitting: Urology

## 2020-02-17 ENCOUNTER — Ambulatory Visit: Payer: Medicaid Other | Admitting: Urology

## 2020-03-01 ENCOUNTER — Encounter: Payer: Self-pay | Admitting: Internal Medicine

## 2020-03-02 ENCOUNTER — Other Ambulatory Visit: Payer: Self-pay

## 2020-03-02 DIAGNOSIS — R0981 Nasal congestion: Secondary | ICD-10-CM

## 2020-03-02 MED ORDER — FLUTICASONE PROPIONATE 50 MCG/ACT NA SUSP: 2.0000 | Freq: Every day | NASAL | 6 refills | Status: DC

## 2020-03-05 ENCOUNTER — Other Ambulatory Visit: Payer: Self-pay

## 2020-03-05 ENCOUNTER — Ambulatory Visit
Admission: RE | Admit: 2020-03-05 | Discharge: 2020-03-05 | Disposition: A | Discharge status: Home / Self Care | Payer: Medicaid Other | Source: Ambulatory Visit | Attending: Family Medicine | Admitting: Family Medicine

## 2020-03-05 DIAGNOSIS — Z1231 Encounter for screening mammogram for malignant neoplasm of breast: Secondary | ICD-10-CM | POA: Diagnosis not present

## 2020-03-05 DIAGNOSIS — Z170 Estrogen receptor positive status [ER+]: Secondary | ICD-10-CM | POA: Diagnosis not present

## 2020-03-05 DIAGNOSIS — Z853 Personal history of malignant neoplasm of breast: Secondary | ICD-10-CM | POA: Diagnosis not present

## 2020-03-05 DIAGNOSIS — C50412 Malignant neoplasm of upper-outer quadrant of left female breast: Secondary | ICD-10-CM | POA: Insufficient documentation

## 2020-03-09 ENCOUNTER — Other Ambulatory Visit: Payer: Self-pay | Admitting: Urology

## 2020-03-09 ENCOUNTER — Other Ambulatory Visit: Payer: Self-pay | Admitting: Oncology

## 2020-03-09 DIAGNOSIS — N3946 Mixed incontinence: Secondary | ICD-10-CM

## 2020-03-09 DIAGNOSIS — R3915 Urgency of urination: Secondary | ICD-10-CM

## 2020-03-09 DIAGNOSIS — R351 Nocturia: Secondary | ICD-10-CM

## 2020-03-11 NOTE — Progress Notes (Signed)
03/12/2020 11:28 AM   Elizabeth Hoffman March 11, 1971 [id number]  Referring provider: Towanda Malkin, MD [address] [place],  [place] [address]  Chief Complaint  Patient presents with  . Urinary Urgency    HPI: Elizabeth Hoffman is a 49 year old female with urgency, incontinence and nocturia who presents today for follow up.    The patient is experiencing urgency x 4-7 (unchanged), frequency x 8 or more (unchanged), not restricting fluids to avoid visits to the restroom, is engaging in toilet mapping, incontinence x 4-7 (unchanged) and nocturia x 0-3 (unchanged).  Her BP is 143/86.   Her PVR is 0 mL.   Her main complaints are frequency, urgency and incontinence.  She has been without the Myrbetriq and Toviaz for a few weeks and her symptoms have worsened.  She is currently on Myrbetriq 25 mg daily and Toviaz   Patient denies any modifying or aggravating factors.  Patient denies any gross hematuria, dysuria or suprapubic/flank pain.  Patient denies any fevers, chills, nausea or vomiting.   UA is unremarkable.    PMH: Past Medical History:  Diagnosis Date  . Anxiety   . Arthritis   . Breast cancer ([place]) 2016  . Depression   . Hot flashes   . Hypertension   . OSA on CPAP 11/03/2016  . Personal history of radiation therapy     Surgical History: Past Surgical History:  Procedure Laterality Date  . ABDOMINAL HYSTERECTOMY  06/15/2017  . BREAST EXCISIONAL BIOPSY Left 03/18/2015   breast ca +  . BREAST LUMPECTOMY WITH RADIOACTIVE SEED LOCALIZATION Left 03/18/2015   Procedure: BREAST LUMPECTOMY WITH RADIOACTIVE SEED LOCALIZATION;  Surgeon: Erroll Luna, MD;  Location: [place];  Service: General;  Laterality: Left;  . CERVICAL BIOPSY  W/ LOOP ELECTRODE EXCISION    . CHOLECYSTECTOMY    . HERNIA REPAIR    . MASTECTOMY, PARTIAL Left 02/15/2015  . NASAL SEPTUM SURGERY    . TONSILLECTOMY AND ADENOIDECTOMY    . UVULECTOMY      Home  Medications:  Allergies as of 03/12/2020   No Known Allergies     Medication List       Accurate as of March 12, 2020 11:59 PM. If you have any questions, ask your nurse or doctor.        STOP taking these medications   anastrozole 1 MG tablet Commonly known as: ARIMIDEX Stopped by: Tatum Corl, PA-C   Mobic 15 MG tablet Generic drug: meloxicam Stopped by: Anahla Bevis, PA-C   oxyCODONE 5 MG immediate release tablet Commonly known as: Oxy IR/ROXICODONE Stopped by: Johntae Broxterman, PA-C   pregabalin 75 MG capsule Commonly known as: LYRICA Stopped by: Rolin Schult, PA-C     TAKE these medications   amLODipine 10 MG tablet Commonly known as: NORVASC Take 1 tablet (10 mg total) by mouth daily.   diclofenac 75 MG EC tablet Commonly known as: VOLTAREN   fesoterodine 4 MG Tb24 tablet Commonly known as: Toviaz Take 1 tablet (4 mg total) by mouth daily. What changed: how much to take Changed by: Reagan Klemz, PA-C   fluticasone 50 MCG/ACT nasal spray Commonly known as: FLONASE Place 2 sprays into both nostrils daily.   hydrochlorothiazide 25 MG tablet Commonly known as: HYDRODIURIL Take 1 tablet (25 mg total) by mouth daily.   metFORMIN 500 MG 24 hr tablet Commonly known as: GLUCOPHAGE-XR Take 3 tablets by mouth daily with Breakfast   mirabegron ER 25 MG Tb24 tablet  Commonly known as: MYRBETRIQ Take 1 tablet (25 mg total) by mouth daily. Started by: Zara Council, PA-C   omeprazole 20 MG capsule Commonly known as: PRILOSEC Take 20 mg by mouth daily.   sertraline 100 MG tablet Commonly known as: ZOLOFT Take 1 tablet (100 mg total) by mouth daily.       Allergies: No Known Allergies  Family History: Family History  Adopted: Yes  Problem Relation Age of Onset  . Breast cancer Neg Hx     Social History:  reports that she has never smoked. She has never used smokeless tobacco. She reports that she does not drink alcohol and does not  use drugs.  ROS: For pertinent review of systems please refer to history of present illness  Physical Exam: BP (!) 143/86   Pulse 76   Wt (!) 330 lb (149.7 kg)   LMP 02/18/2015 (Approximate)   BMI 48.73 kg/m   Constitutional:  Well nourished. Alert and oriented, No acute distress. HEENT: [place] AT, mask in place.  Trachea midline Cardiovascular: No clubbing, cyanosis, or edema. Respiratory: Normal respiratory effort, no increased work of breathing. Neurologic: Grossly intact, no focal deficits, moving all 4 extremities. Psychiatric: Normal mood and affect.   Laboratory Data: Lab Results  Component Value Date   WBC 6.8 11/30/2015   HGB 12.3 11/30/2015   HCT 38.1 11/30/2015   MCV 84.5 11/30/2015   PLT 236 11/30/2015    Lab Results  Component Value Date   CREATININE 1.00 01/21/2018     Lab Results  Component Value Date   HGBA1C 6.3 (H) 01/21/2018    Lab Results  Component Value Date   TSH 4.60 (H) 11/30/2015       Component Value Date/Time   CHOL 167 01/21/2018 1022   CHOL 202 (H) 02/04/2015 0905   HDL 57 01/21/2018 1022   HDL 55 02/04/2015 0905   CHOLHDL 2.9 01/21/2018 1022   VLDL 33 (H) 11/30/2015 1123   LDLCALC 87 01/21/2018 1022    Lab Results  Component Value Date   AST 28 01/21/2018   Lab Results  Component Value Date   ALT 28 01/21/2018   Urinalysis Component     Latest Ref Rng & Units 03/12/2020  Specific Gravity, UA     1.005 - 1.030 1.020  pH, UA     5.0 - 7.5 5.5  Color, UA     Yellow Yellow  Appearance Ur     Clear Cloudy (A)  Leukocytes,UA     Negative Negative  Protein,UA     Negative/Trace Negative  Glucose, UA     Negative Negative  Ketones, UA     Negative Negative  RBC, UA     Negative Negative  Bilirubin, UA     Negative Negative  Urobilinogen, Ur     0.2 - 1.0 mg/dL 0.2  Nitrite, UA     Negative Negative  Microscopic Examination      See below:   Component     Latest Ref Rng & Units 03/12/2020  WBC, UA     0  - 5 /hpf 0-5  RBC     0 - 2 /hpf 0-2  Epithelial Cells (non renal)     0 - 10 /hpf 0-10  Bacteria, UA     None seen/Few Few  Yeast, UA     None seen Present (A)    I have reviewed the labs.   Pertinent Imaging: Results for [place]ATEL[place], [place] (MRN [id number]) as  of 03/12/2020 08:49  Ref. Range 03/12/2020 08:45  Scan Result Unknown 0 ml    Assessment & Plan:    1. Urgency Patient is at goal on Myrbetriq 25 mg daily and Toviaz 4 mg daily-I have sent refills of these medications to [facility] She will return in 1 year for OAB questionnaire and PVR  2. Incontinence See above  3. Nocturia See above   Return in about 1 year (around 03/12/2021) for PVR and OAB questionnaire.  These notes generated with voice recognition software. I apologize for typographical errors.  Zara Council, PA-C  [facility] [address]  [place], [place] [address] [phone]

## 2020-03-12 ENCOUNTER — Encounter: Payer: Self-pay | Admitting: Urology

## 2020-03-12 ENCOUNTER — Ambulatory Visit (INDEPENDENT_AMBULATORY_CARE_PROVIDER_SITE_OTHER): Payer: Medicaid Other | Admitting: Urology

## 2020-03-12 ENCOUNTER — Other Ambulatory Visit: Payer: Self-pay

## 2020-03-12 VITALS — BP 143/86 | HR 76 | Wt 330.0 lb

## 2020-03-12 DIAGNOSIS — R3915 Urgency of urination: Secondary | ICD-10-CM

## 2020-03-12 DIAGNOSIS — R351 Nocturia: Secondary | ICD-10-CM

## 2020-03-12 DIAGNOSIS — N3946 Mixed incontinence: Secondary | ICD-10-CM | POA: Diagnosis not present

## 2020-03-12 LAB — BLADDER SCAN AMB NON-IMAGING: Scan Result: 0

## 2020-03-12 LAB — URINALYSIS, COMPLETE
Bilirubin, UA: NEGATIVE
Glucose, UA: NEGATIVE
Ketones, UA: NEGATIVE
Leukocytes,UA: NEGATIVE
Nitrite, UA: NEGATIVE
Protein,UA: NEGATIVE
RBC, UA: NEGATIVE
Specific Gravity, UA: 1.02 (ref 1.005–1.030)
Urobilinogen, Ur: 0.2 mg/dL (ref 0.2–1.0)
pH, UA: 5.5 (ref 5.0–7.5)

## 2020-03-12 LAB — MICROSCOPIC EXAMINATION

## 2020-03-12 MED ORDER — FESOTERODINE FUMARATE ER 4 MG PO TB24: 4.0000 mg | ORAL_TABLET | Freq: Every day | ORAL | 3 refills | Status: DC

## 2020-03-12 MED ORDER — MIRABEGRON ER 25 MG PO TB24: 25.0000 mg | ORAL_TABLET | Freq: Every day | ORAL | 3 refills | Status: DC

## 2020-04-14 ENCOUNTER — Other Ambulatory Visit: Payer: Self-pay | Admitting: Family Medicine

## 2020-04-16 DIAGNOSIS — G5602 Carpal tunnel syndrome, left upper limb: Secondary | ICD-10-CM | POA: Diagnosis not present

## 2020-04-16 DIAGNOSIS — Z791 Long term (current) use of non-steroidal anti-inflammatories (NSAID): Secondary | ICD-10-CM | POA: Diagnosis not present

## 2020-04-16 DIAGNOSIS — E119 Type 2 diabetes mellitus without complications: Secondary | ICD-10-CM | POA: Diagnosis not present

## 2020-04-16 DIAGNOSIS — I10 Essential (primary) hypertension: Secondary | ICD-10-CM | POA: Diagnosis not present

## 2020-04-16 DIAGNOSIS — G5603 Carpal tunnel syndrome, bilateral upper limbs: Secondary | ICD-10-CM | POA: Diagnosis not present

## 2020-04-16 DIAGNOSIS — Z7984 Long term (current) use of oral hypoglycemic drugs: Secondary | ICD-10-CM | POA: Diagnosis not present

## 2020-04-16 HISTORY — PX: CARPAL TUNNEL RELEASE: SHX101

## 2020-05-02 ENCOUNTER — Encounter: Payer: Self-pay | Admitting: Internal Medicine

## 2020-05-03 ENCOUNTER — Other Ambulatory Visit: Payer: Self-pay

## 2020-05-03 ENCOUNTER — Telehealth (INDEPENDENT_AMBULATORY_CARE_PROVIDER_SITE_OTHER): Payer: Medicaid Other | Admitting: Internal Medicine

## 2020-05-03 ENCOUNTER — Encounter: Payer: Self-pay | Admitting: Internal Medicine

## 2020-05-03 DIAGNOSIS — J00 Acute nasopharyngitis [common cold]: Secondary | ICD-10-CM

## 2020-05-03 DIAGNOSIS — R0981 Nasal congestion: Secondary | ICD-10-CM

## 2020-05-03 DIAGNOSIS — J0100 Acute maxillary sinusitis, unspecified: Secondary | ICD-10-CM

## 2020-05-03 MED ORDER — FLUTICASONE PROPIONATE 50 MCG/ACT NA SUSP: 2.0000 | Freq: Every day | NASAL | 1 refills | Status: AC

## 2020-05-03 MED ORDER — AMOXICILLIN-POT CLAVULANATE 875-125 MG PO TABS: 1.0000 | ORAL_TABLET | Freq: Two times a day (BID) | ORAL | 0 refills | Status: AC

## 2020-05-03 NOTE — Progress Notes (Signed)
Name: Elizabeth Hoffman   MRN: [id number]    DOB: [birth year]   Date:05/03/2020       Progress Note  Subjective  Chief Complaint  Chief Complaint  Patient presents with  . Facial Pain  . Nasal Congestion    I connected with  Elizabeth Hoffman on 05/03/20 at  3:00 PM EST by telephone and verified that I am speaking with the correct person using two identifiers.  I discussed the limitations, risks, security and privacy concerns of performing an evaluation and management service by telephone and the availability of in person appointments. The patient expressed understanding and agreed to proceed. Staff also discussed with the patient that there may be a patient responsible charge related to this service. 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  She denies any reactions to prior antibiotics  Patient Active Problem List   Diagnosis Date Noted  . Cubital tunnel syndrome 11/11/2018  . Cervical radiculopathy 05/23/2018  . OSA on CPAP  11/03/2016  . History of cervical cancer 02/02/2016  . Hypercoagulable state ([place]) 02/02/2016  . Abnormal uterine bleeding (AUB) 10/02/2015  . Obesity, morbid, BMI 50 or higher ([place]) 07/05/2015  . Malignant neoplasm of upper-outer quadrant of left breast in female, estrogen receptor positive ([place]) 02/22/2015  . Hyperlipidemia LDL goal <100 02/08/2015  . GERD without esophagitis 02/03/2015  . Menorrhagia with irregular cycle 02/03/2015  . Depressive disorder 02/03/2015  . Hypertension goal BP (blood pressure) < 140/90 02/03/2015  . De Quervain's tenosynovitis, bilateral 02/03/2015  . Numerous moles 02/03/2015  . Pre-diabetes 02/03/2015  . History of loop electrosurgical excision procedure (LEEP) of cervix 02/03/2015    Past Surgical History:  Procedure Laterality Date  . ABDOMINAL HYSTERECTOMY  06/15/2017  . BREAST EXCISIONAL BIOPSY Left 03/18/2015   breast ca +  . BREAST LUMPECTOMY WITH RADIOACTIVE SEED LOCALIZATION Left 03/18/2015   Procedure: BREAST LUMPECTOMY WITH RADIOACTIVE SEED LOCALIZATION;  Surgeon: Erroll Luna, MD;  Location: [place];  Service: General;  Laterality: Left;  . CARPAL TUNNEL RELEASE Bilateral 04/16/2020  . CERVICAL BIOPSY  W/ LOOP ELECTRODE EXCISION    . CHOLECYSTECTOMY    . HERNIA REPAIR    . MASTECTOMY, PARTIAL Left 02/15/2015  . NASAL SEPTUM SURGERY    . TONSILLECTOMY AND ADENOIDECTOMY    . UVULECTOMY      Family History  Adopted: Yes  Problem Relation Age of Onset  . Breast cancer Neg Hx     Social History   Tobacco Use  .  Smoking status: Never Smoker  . Smokeless tobacco: Never Used  Substance Use Topics  . Alcohol use: No    Alcohol/week: 0.0 standard drinks     Current Outpatient Medications:  .  amLODipine (NORVASC) 10 MG tablet, Take 1 tablet (10 mg total) by mouth daily., Disp: 90 tablet, Rfl: 1 .  diclofenac (VOLTAREN) 75 MG EC tablet, , Disp: , Rfl:  .  fesoterodine (TOVIAZ) 4 MG TB24 tablet, Take 1 tablet (4  mg total) by mouth daily., Disp: 90 tablet, Rfl: 3 .  hydrochlorothiazide (HYDRODIURIL) 25 MG tablet, Take 1 tablet (25 mg total) by mouth daily., Disp: 90 tablet, Rfl: 1 .  metFORMIN (GLUCOPHAGE-XR) 500 MG 24 hr tablet, Take 3 tablets by mouth daily with Breakfast, Disp: 270 tablet, Rfl: 1 .  mirabegron ER (MYRBETRIQ) 25 MG TB24 tablet, Take 1 tablet (25 mg total) by mouth daily., Disp: 90 tablet, Rfl: 3 .  omeprazole (PRILOSEC) 20 MG capsule, Take 20 mg by mouth daily., Disp: , Rfl:  .  sertraline (ZOLOFT) 100 MG tablet, Take 1 tablet (100 mg total) by mouth daily., Disp: 90 tablet, Rfl: 1 .  amoxicillin-clavulanate (AUGMENTIN) 875-125 MG tablet, Take 1 tablet by mouth 2 (two) times daily for 7 days., Disp: 14 tablet, Rfl: 0 .  fluticasone (FLONASE) 50 MCG/ACT nasal spray, Place 2 sprays into both nostrils daily. Can use twice daily for the first 3 days, then return to once daily, Disp: 9.9 mL, Rfl: 1  No Known Allergies  With staff assistance, above reviewed with the patient today.  ROS: As per HPI, otherwise no specific complaints on a limited and focused system review   Objective  Virtual encounter, vitals not obtained.  There is no height or weight on file to calculate BMI.  Physical Exam   Appears in NAD via conversation, pleasant Breathing: No obvious respiratory distress. Speaking in complete sentences Neurological: Pt is alert, Speech is normal Psychiatric: Patient has a normal mood and affect, behavior is normal. Judgment and thought content normal.   No results found for this or any previous visit (from the past 72 hour(s)).  PHQ2/9: Depression screen [facility] 2/9 05/03/2020 09/08/2019 06/26/2019 02/19/2019 01/28/2019  Decreased Interest 0 0 0 0 0  Down, Depressed, Hopeless 0 0 0 0 0  PHQ - 2 Score 0 0 0 0 0  Altered sleeping 0 0 0 0 0  Tired, decreased energy 0 0 0 0 0  Change in appetite 0 0 0 0 0  Feeling bad or failure about yourself  0 0 0 0 0  Trouble concentrating 0 0  0 0 0  Moving slowly or fidgety/restless 0 0 0 0 0  Suicidal thoughts 0 0 0 0 0  PHQ-9 Score 0 0 0 0 0  Difficult doing work/chores - Not difficult at all Not difficult at all Not difficult at all Not difficult at all  Some recent data might be hidden   PHQ-2/9 Result reviewed  Fall Risk: Fall Risk  05/03/2020 09/08/2019 06/26/2019 03/04/2019 02/19/2019  Falls in the past year? 0 0 0 0 0  Number falls in past yr: 0 0 0 0 0  Injury with Fall? 0 0 0 0 0  Follow up - - - - Falls evaluation completed     Assessment & Plan  1. Acute non-recurrent maxillary sinusitis She notes the symptoms have continually increased over the last week, and not responding to over-the-counter entities.  Does have thick yellow-green discharge and sinus pains noted.  Do feel reasonable to add an antibiotic, with Augmentin prescribed twice daily. Also recommended continuing the Mucinex product. Also recommended a Flonase product-twice daily for the first 3 days, then once daily after Also can use Tylenol or ibuprofen products as needed for discomfort, Rest and good hydration noted Discussed concerns with her working at a daycare, and she noted they have daily screenings, has not had fevers or cough, and does wear a mask at all times when working.  Emphasized that she develops any concerns or other symptoms developing the importance of staying home and she noted she would not was understanding of that. Also noted if symptoms not improving, worsening with more symptoms developing as the week is progressing, she should follow-up, and likely would recommend getting a Covid test at that time as well.  - amoxicillin-clavulanate (AUGMENTIN) 875-125 MG tablet; Take 1 tablet by mouth 2 (two) times daily for 7 days.  Dispense: 14 tablet; Refill: 0  2. Acute rhinitis 3. Nasal congestion  - fluticasone (FLONASE) 50 MCG/ACT nasal spray; Place 2 sprays into both nostrils daily. Can use twice daily for the first 3 days, then  return to once daily  Dispense: 9.9 mL; Refill: 1   I discussed the assessment and treatment plan with the patient. The patient was provided an opportunity to ask questions and all were answered. The patient agreed with the plan and demonstrated an understanding of the instructions.   The patient was advised to call back or seek an in-person evaluation if the symptoms worsen or if the condition fails to improve as anticipated.  I provided 15 minutes of non-face-to-face time during this encounter that included discussing at length patient's sx/history, pertinent pmhx, medications, treatment and follow up plan. This time also included the necessary documentation, orders, and chart review.  Towanda Malkin, MD

## 2020-05-11 ENCOUNTER — Ambulatory Visit: Payer: Medicaid Other | Admitting: Podiatry

## 2020-05-19 ENCOUNTER — Other Ambulatory Visit: Payer: Self-pay | Admitting: Family Medicine

## 2020-05-24 ENCOUNTER — Encounter: Payer: Self-pay | Admitting: Internal Medicine

## 2020-05-24 ENCOUNTER — Other Ambulatory Visit: Payer: Medicaid Other

## 2020-05-24 DIAGNOSIS — Z20822 Contact with and (suspected) exposure to covid-19: Secondary | ICD-10-CM | POA: Diagnosis not present

## 2020-05-26 LAB — NOVEL CORONAVIRUS, NAA: SARS-CoV-2, NAA: DETECTED — AB

## 2020-05-26 LAB — SARS-COV-2, NAA 2 DAY TAT

## 2020-05-27 ENCOUNTER — Telehealth: Payer: Self-pay

## 2020-05-27 NOTE — Telephone Encounter (Signed)
Called to discuss with patient about COVID-19 symptoms and the use of one of the available treatments for those with mild to moderate Covid symptoms and at a high risk of hospitalization.  Pt appears to qualify for outpatient treatment due to co-morbid conditions and/or a member of an at-risk group in accordance with the FDA Emergency Use Authorization.    Symptom onset: Unknown Vaccinated: Yes Booster? Unknown Immunocompromised? No Qualifiers: HTN  Unable to reach pt - Left message and call back number [phone].   Elizabeth Hoffman

## 2020-05-28 ENCOUNTER — Encounter: Payer: Self-pay | Admitting: Internal Medicine

## 2020-06-01 ENCOUNTER — Encounter: Payer: Self-pay | Admitting: Internal Medicine

## 2020-06-02 ENCOUNTER — Encounter: Payer: Self-pay | Admitting: Internal Medicine

## 2020-06-02 ENCOUNTER — Telehealth (INDEPENDENT_AMBULATORY_CARE_PROVIDER_SITE_OTHER): Payer: Medicaid Other | Admitting: Internal Medicine

## 2020-06-02 DIAGNOSIS — U071 COVID-19: Secondary | ICD-10-CM

## 2020-06-02 DIAGNOSIS — J0101 Acute recurrent maxillary sinusitis: Secondary | ICD-10-CM

## 2020-06-02 MED ORDER — AMOXICILLIN-POT CLAVULANATE 875-125 MG PO TABS: 1.0000 | ORAL_TABLET | Freq: Two times a day (BID) | ORAL | 0 refills | Status: DC

## 2020-06-02 NOTE — Progress Notes (Signed)
Name: Elizabeth Hoffman   MRN: [id number]    DOB: [birth year]   Date:06/02/2020       Progress Note  Subjective  Chief Complaint  Chief Complaint  Patient presents with  . Nasal Congestion  . facial pressure    I connected with  Elizabeth Hoffman on 06/02/20 at  1:40 PM EST by telephone and verified that I am speaking with the correct person using two identifiers.  I discussed the limitations, risks, security and privacy concerns of performing an evaluation and management service by telephone and the availability of in person appointments. The patient expressed understanding and agreed to proceed. Staff also discussed with the patient that there may be a patient responsible charge related to this service. Patient Location: work Secondary school teacher Location: [facility] Additional Individuals present: none  HPI  Patient is a 50 year old female Message received 05/28/2020 notes she tested positive for COVID that week, was well on the mend, but left with a sinus infection.  She requested antibiotics be called in. It was noted she had a visit 05/03/2020 for a sinus infection with antibiotics prescribed at that time. Was completely better. Asked to have further information before I could prescribe antibiotics. Follows up today via phone visit  Covid vaccination status - had both and a booster Was COVID-positive last week, 05/24/20 Sx's noted to increase again 4 days ago  No marked cough, minimal, no production No marked SOB No fever, not feeling feverish No sore throat.  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 Cubital tunnel syndrome 11/11/2018  . Cervical radiculopathy 05/23/2018  . OSA on CPAP 11/03/2016  . History of cervical cancer 02/02/2016  . Hypercoagulable state ([place]) 02/02/2016  . Abnormal uterine bleeding (AUB) 10/02/2015  . Obesity, morbid, BMI 50 or higher ([place]) 07/05/2015  . Malignant neoplasm of upper-outer quadrant of left breast in female, estrogen receptor positive ([place]) 02/22/2015  . Hyperlipidemia LDL goal <100 02/08/2015  . GERD without esophagitis 02/03/2015  . Menorrhagia with irregular cycle 02/03/2015  . Depressive disorder 02/03/2015  . Hypertension goal BP (blood pressure) < 140/90 02/03/2015  . De Quervain's tenosynovitis, bilateral 02/03/2015  . Numerous moles 02/03/2015  . Pre-diabetes 02/03/2015  . History of loop electrosurgical excision procedure (LEEP) of cervix 02/03/2015    Past Surgical History:  Procedure Laterality Date  . ABDOMINAL HYSTERECTOMY  06/15/2017  . BREAST EXCISIONAL BIOPSY Left 03/18/2015   breast ca +  . BREAST LUMPECTOMY WITH RADIOACTIVE SEED LOCALIZATION Left 03/18/2015   Procedure: BREAST LUMPECTOMY WITH RADIOACTIVE SEED LOCALIZATION;  Surgeon: Erroll Luna, MD;  Location: [place];  Service: General;  Laterality: Left;  . CARPAL TUNNEL RELEASE Bilateral 04/16/2020  . CERVICAL BIOPSY  W/ LOOP ELECTRODE EXCISION    . CHOLECYSTECTOMY    . HERNIA REPAIR    . MASTECTOMY, PARTIAL Left 02/15/2015  . NASAL SEPTUM SURGERY    .  TONSILLECTOMY AND ADENOIDECTOMY    . UVULECTOMY      Family History  Adopted: Yes  Problem Relation Age of Onset  . Breast cancer Neg Hx     Social History   Tobacco Use  . Smoking status: Never Smoker  . Smokeless tobacco: Never Used  Substance Use Topics  . Alcohol use: No    Alcohol/week: 0.0 standard  drinks     Current Outpatient Medications:  .  amLODipine (NORVASC) 10 MG tablet, Take 1 tablet (10 mg total) by mouth daily., Disp: 90 tablet, Rfl: 1 .  diclofenac (VOLTAREN) 75 MG EC tablet, , Disp: , Rfl:  .  fesoterodine (TOVIAZ) 4 MG TB24 tablet, Take 1 tablet (4 mg total) by mouth daily., Disp: 90 tablet, Rfl: 3 .  fluticasone (FLONASE) 50 MCG/ACT nasal spray, Place 2 sprays into both nostrils daily. Can use twice daily for the first 3 days, then return to once daily, Disp: 9.9 mL, Rfl: 1 .  hydrochlorothiazide (HYDRODIURIL) 25 MG tablet, Take 1 tablet (25 mg total) by mouth daily., Disp: 90 tablet, Rfl: 1 .  metFORMIN (GLUCOPHAGE-XR) 500 MG 24 hr tablet, Take 3 tablets by mouth daily with Breakfast, Disp: 270 tablet, Rfl: 1 .  mirabegron ER (MYRBETRIQ) 25 MG TB24 tablet, Take 1 tablet (25 mg total) by mouth daily., Disp: 90 tablet, Rfl: 3 .  omeprazole (PRILOSEC) 20 MG capsule, Take 20 mg by mouth daily., Disp: , Rfl:  .  sertraline (ZOLOFT) 100 MG tablet, Take 1 tablet (100 mg total) by mouth daily., Disp: 90 tablet, Rfl: 1  No Known Allergies  With staff assistance, above reviewed with the patient today.  ROS: As per HPI, otherwise no specific complaints on a limited and focused system review   Objective  Virtual encounter, vitals not obtained.  There is no height or weight on file to calculate BMI.  Physical Exam   Appears in NAD via conversation, no coughing during the conversation Pulmonary/Chest: No obvious respiratory distress. Speaking in complete sentences Neurological: Pt is alert, Speech is normal Psychiatric: Patient has a normal mood and affect, behavior is normal. Judgment and thought content normal.   No results found for this or any previous visit (from the past 72 hour(s)).  PHQ2/9: Depression screen [facility] 2/9 06/02/2020 05/03/2020 09/08/2019 06/26/2019 02/19/2019  Decreased Interest 0 0 0 0 0  Down, Depressed, Hopeless 0 0 0 0 0  PHQ - 2 Score 0 0 0 0 0   Altered sleeping 0 0 0 0 0  Tired, decreased energy 0 0 0 0 0  Change in appetite 0 0 0 0 0  Feeling bad or failure about yourself  0 0 0 0 0  Trouble concentrating 0 0 0 0 0  Moving slowly or fidgety/restless 0 0 0 0 0  Suicidal thoughts 0 0 0 0 0  PHQ-9 Score 0 0 0 0 0  Difficult doing work/chores - - Not difficult at all Not difficult at all Not difficult at all  Some recent data might be hidden   PHQ-2/9 Result reviewed  Fall Risk: Fall Risk  06/02/2020 05/03/2020 09/08/2019 06/26/2019 03/04/2019  Falls in the past year? 0 0 0 0 0  Number falls in past yr: 0 0 0 0 0  Injury with Fall? 0 0 0 0 0  Follow up - - - - -     Assessment & Plan  1. Covid + - 05/24/20 Seen as symptoms have improved significantly, and as improving, she  started to get more sinus congestion and pain, with concerns for another sinus infection.  She states when it does get localized to her sinuses, it does not respond well to over-the-counter entities, and often needs an antibiotic to help eradicate.  She thinks she is at that point.  2. Acute non-recurrent maxillary sinusitis She notes the symptoms have continually increased over the last few days and not responding to over-the-counter entities.  Bothered by the pressure/pain component continuously. Discussed concerns with frequent antibiotic use, and she did just have treatment for sinusitis in December. Noting this, do feel reasonable to add an antibiotic, with Augmentin prescribed twice daily. Also recommended continuing the Flonase product- can use twice daily for 3 days, then once daily after Also can use Tylenol or ibuprofen products as needed for discomfort, Recommended lessening the Sudafed products, as often that can increase the congestive/pain concern, as well as have other side effect issues. Staying hydrated important  - amoxicillin-clavulanate (AUGMENTIN) 875-125 MG tablet; Take 1 tablet by mouth 2 (two) times daily for 7 days.  Dispense: 14  tablet; Refill: 0  For follow-up if symptoms not improving or more problematic over time.  Discussed if symptoms not resolving, or if rapidly recur again, likely will need further work-up including imaging studies, possible referral pending her status at that point.  I discussed the assessment and treatment plan with the patient. The patient was provided an opportunity to ask questions and all were answered. The patient agreed with the plan and demonstrated an understanding of the instructions.  The patient was advised to call back or seek an in-person evaluation if the symptoms worsen or if the condition fails to improve as anticipated.  I provided 15 minutes of non-face-to-face time during this encounter that included discussing at length patient's sx/history, pertinent pmhx, medications, treatment and follow up plan. This time also included the necessary documentation, orders, and chart review.  Towanda Malkin, MD

## 2020-06-28 ENCOUNTER — Other Ambulatory Visit: Payer: Self-pay | Admitting: Family Medicine

## 2020-07-09 ENCOUNTER — Ambulatory Visit (INDEPENDENT_AMBULATORY_CARE_PROVIDER_SITE_OTHER): Payer: Medicaid Other

## 2020-07-09 ENCOUNTER — Other Ambulatory Visit: Payer: Self-pay

## 2020-07-09 ENCOUNTER — Encounter: Payer: Self-pay | Admitting: Podiatry

## 2020-07-09 ENCOUNTER — Ambulatory Visit (INDEPENDENT_AMBULATORY_CARE_PROVIDER_SITE_OTHER): Payer: Medicaid Other | Admitting: Podiatry

## 2020-07-09 DIAGNOSIS — M778 Other enthesopathies, not elsewhere classified: Secondary | ICD-10-CM

## 2020-07-09 MED ORDER — BETAMETHASONE SOD PHOS & ACET 6 (3-3) MG/ML IJ SUSP
3.0000 mg | Freq: Once | INTRAMUSCULAR | Status: AC
  Administered 2020-07-09: 3 mg via INTRA_ARTICULAR

## 2020-07-09 NOTE — Progress Notes (Signed)
   HPI: 50 year old female presenting today for follow up evaluation of throbbing left foot pain.  Patient states that she has been doing very well since last visit.  She was seen approximately 1 year ago in the office.  Slowly over the last month the pain has returned.  She states that the injections helped significantly.  Past Medical History:  Diagnosis Date  . Anxiety   . Arthritis   . Breast cancer ([place]) 2016  . Depression   . Hot flashes   . Hypertension   . OSA on CPAP 11/03/2016  . Personal history of radiation therapy      Physical Exam: General: The patient is alert and oriented x3 in no acute distress.  Dermatology: Skin is warm, dry and supple bilateral lower extremities. Negative for open lesions or macerations.  Vascular: Palpable pedal pulses bilaterally. No edema or erythema noted. Capillary refill within normal limits.  Neurological: Epicritic and protective threshold grossly intact bilaterally.   Musculoskeletal Exam: Pain with palpation to the dorsal aspect of the left foot.  Range of motion within normal limits to all pedal and ankle joints bilateral. Muscle strength 5/5 in all groups bilateral.    Assessment: 1. Capsulitis left foot   Plan of Care:  1. Patient evaluated. X-Rays reviewed.  2. Injection of 0.5 mLs Celestone Soluspan injected into the dorsal left midtarsal joint.  3. Continue taking OTC ibuprofen as needed 4. Recommended good shoe gear.  5. Return to clinic as needed.   Daycare teacher standing all day.     Edrick Kins, DPM [facility]  Dr. Edrick Kins, DPM    [address], [place] [address]                Office [phone]  Fax [phone]

## 2020-08-23 ENCOUNTER — Other Ambulatory Visit: Payer: Self-pay | Admitting: Family Medicine

## 2020-08-30 DIAGNOSIS — S39012A Strain of muscle, fascia and tendon of lower back, initial encounter: Secondary | ICD-10-CM | POA: Diagnosis not present

## 2020-08-31 ENCOUNTER — Other Ambulatory Visit: Payer: Self-pay | Admitting: Family Medicine

## 2020-09-01 ENCOUNTER — Other Ambulatory Visit: Payer: Self-pay | Admitting: Family Medicine

## 2020-09-02 ENCOUNTER — Other Ambulatory Visit: Payer: Self-pay | Admitting: Family Medicine

## 2020-09-02 MED ORDER — SERTRALINE HCL 100 MG PO TABS: 100.0000 mg | ORAL_TABLET | Freq: Every day | ORAL | 0 refills | Status: DC

## 2020-09-02 MED ORDER — AMLODIPINE BESYLATE 10 MG PO TABS: 1.0000 | ORAL_TABLET | Freq: Every day | ORAL | 0 refills | Status: DC

## 2020-09-04 ENCOUNTER — Other Ambulatory Visit: Payer: Self-pay | Admitting: Family Medicine

## 2020-09-13 ENCOUNTER — Telehealth: Payer: Self-pay

## 2020-09-13 NOTE — Telephone Encounter (Signed)
Pt notified, done through lab draw

## 2020-09-13 NOTE — Telephone Encounter (Signed)
Copied from [place] [id number]. Topic: General - Other >> Sep 13, 2020  1:54 PM Keene Breath wrote: Reason for CRM: Patient called to request a TB skin test for her employer.  Please advise and call to confirm at [phone]

## 2020-09-28 ENCOUNTER — Ambulatory Visit: Payer: Medicaid Other | Admitting: Family Medicine

## 2020-09-28 ENCOUNTER — Other Ambulatory Visit: Payer: Self-pay

## 2020-09-28 ENCOUNTER — Encounter: Payer: Self-pay | Admitting: Family Medicine

## 2020-09-28 VITALS — BP 122/80 | HR 90 | Temp 98.1°F | Resp 16 | Ht 69.0 in | Wt 366.2 lb

## 2020-09-28 DIAGNOSIS — Z1159 Encounter for screening for other viral diseases: Secondary | ICD-10-CM

## 2020-09-28 DIAGNOSIS — F320 Major depressive disorder, single episode, mild: Secondary | ICD-10-CM | POA: Diagnosis not present

## 2020-09-28 DIAGNOSIS — Z5181 Encounter for therapeutic drug level monitoring: Secondary | ICD-10-CM

## 2020-09-28 DIAGNOSIS — R7303 Prediabetes: Secondary | ICD-10-CM | POA: Diagnosis not present

## 2020-09-28 DIAGNOSIS — F439 Reaction to severe stress, unspecified: Secondary | ICD-10-CM | POA: Diagnosis not present

## 2020-09-28 DIAGNOSIS — Z0289 Encounter for other administrative examinations: Secondary | ICD-10-CM

## 2020-09-28 DIAGNOSIS — Z1211 Encounter for screening for malignant neoplasm of colon: Secondary | ICD-10-CM | POA: Diagnosis not present

## 2020-09-28 DIAGNOSIS — E785 Hyperlipidemia, unspecified: Secondary | ICD-10-CM

## 2020-09-28 DIAGNOSIS — K219 Gastro-esophageal reflux disease without esophagitis: Secondary | ICD-10-CM

## 2020-09-28 DIAGNOSIS — F329 Major depressive disorder, single episode, unspecified: Secondary | ICD-10-CM

## 2020-09-28 DIAGNOSIS — N3281 Overactive bladder: Secondary | ICD-10-CM

## 2020-09-28 DIAGNOSIS — J309 Allergic rhinitis, unspecified: Secondary | ICD-10-CM

## 2020-09-28 DIAGNOSIS — Z111 Encounter for screening for respiratory tuberculosis: Secondary | ICD-10-CM

## 2020-09-28 DIAGNOSIS — Z114 Encounter for screening for human immunodeficiency virus [HIV]: Secondary | ICD-10-CM | POA: Diagnosis not present

## 2020-09-28 DIAGNOSIS — I10 Essential (primary) hypertension: Secondary | ICD-10-CM

## 2020-09-28 MED ORDER — BUSPIRONE HCL 5 MG PO TABS: 5.0000 mg | ORAL_TABLET | Freq: Three times a day (TID) | ORAL | 2 refills | Status: DC | PRN

## 2020-09-28 MED ORDER — HYDROXYZINE PAMOATE 25 MG PO CAPS: ORAL_CAPSULE | ORAL | 1 refills | Status: DC

## 2020-09-28 MED ORDER — SERTRALINE HCL 100 MG PO TABS
100.0000 mg | ORAL_TABLET | Freq: Every day | ORAL | 0 refills | Status: DC
Start: 2020-09-28 — End: 2020-12-30

## 2020-09-28 MED ORDER — AMLODIPINE BESYLATE 10 MG PO TABS: 1.0000 | ORAL_TABLET | Freq: Every day | ORAL | 3 refills | Status: DC

## 2020-09-28 MED ORDER — HYDROCHLOROTHIAZIDE 25 MG PO TABS: 1.0000 | ORAL_TABLET | Freq: Every day | ORAL | 3 refills | Status: DC

## 2020-09-28 NOTE — Progress Notes (Signed)
Patient ID: Elizabeth Hoffman, female    DOB: January 11, 1971, 50 y.o.   MRN: [id number]  PCP: Towanda Malkin, MD  Chief Complaint  Patient presents with  . Hypertension  . Depression  . paperwork    For job    Subjective:   Elizabeth Hoffman is a 50 y.o. female, presents to clinic with CC of the following:  HPI  Hypertension:  Currently managed on HCTZ and amlodipine Pt reports good med compliance and denies any SE.   Blood pressure today is well controlled. BP Readings from Last 3 Encounters:  09/28/20 122/80  03/12/20 (!) 143/86  03/04/19 138/86   Pt denies CP, SOB, exertional sx, LE edema, palpitation, Ha's, visual disturbances, lightheadedness, hypotension, syncope.  Prediabetes Lab Results  Component Value Date   HGBA1C 6.3 (H) 01/21/2018  on metformin 1500 mg once daily    Depression: On zoloft 100 mg Having increased stress and anxiety Depression screen [facility] 2/9 09/28/2020 06/02/2020 05/03/2020  Decreased Interest 1 0 0  Down, Depressed, Hopeless 1 0 0  PHQ - 2 Score 2 0 0  Altered sleeping 3 0 0  Tired, decreased energy 2 0 0  Change in appetite 1 0 0  Feeling bad or failure about yourself  1 0 0  Trouble concentrating 1 0 0  Moving slowly or fidgety/restless 0 0 0  Suicidal thoughts 0 0 0  PHQ-9 Score 10 0 0  Difficult doing work/chores Somewhat difficult - -  Some recent data might be hidden   GAD 7 : Generalized Anxiety Score 09/28/2020  Nervous, Anxious, on Edge 1  Control/stop worrying 3  Worry too much - different things 3  Trouble relaxing 1  Restless 1  Easily annoyed or irritable 2  Afraid - awful might happen 2  Total GAD 7 Score 13  Anxiety Difficulty Somewhat difficult     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 Cubital tunnel syndrome 11/11/2018  . Cervical radiculopathy 05/23/2018  . OSA on CPAP 11/03/2016  . History of cervical cancer 02/02/2016  . Hypercoagulable state ([place]) 02/02/2016  . Abnormal uterine bleeding (AUB) 10/02/2015  . Obesity, morbid, BMI 50 or higher ([place]) 07/05/2015  . Malignant neoplasm of upper-outer quadrant of left breast in female, estrogen receptor positive ([place]) 02/22/2015  . Hyperlipidemia LDL goal <100 02/08/2015  . GERD without esophagitis 02/03/2015  . Menorrhagia with irregular cycle 02/03/2015  . Depressive disorder 02/03/2015  . Hypertension goal BP (blood pressure) < 140/90 02/03/2015  . De Quervain's tenosynovitis, bilateral 02/03/2015  . Numerous moles 02/03/2015  . Pre-diabetes 02/03/2015  . History of loop electrosurgical excision procedure (LEEP) of cervix 02/03/2015      Current Outpatient Medications:  .  amLODipine (NORVASC) 10 MG tablet, Take 1 tablet (10 mg total) by mouth daily., Disp: 30 tablet, Rfl: 0 .  fesoterodine (TOVIAZ) 4 MG TB24 tablet, Take 1 tablet (4 mg total) by mouth daily., Disp: 90 tablet, Rfl: 3 .  fluticasone (FLONASE) 50 MCG/ACT nasal spray, Place 2 sprays into both nostrils daily. Can use twice daily for the first 3 days, then return to once daily, Disp: 9.9 mL,  Rfl: 1 .  hydrochlorothiazide (HYDRODIURIL) 25 MG tablet, TAKE 1 TABLET BY MOUTH ONCE DAILY, Disp: 90 tablet, Rfl: 0 .  ibuprofen (ADVIL) 800 MG tablet, ibuprofen 800 mg tablet  Take 1 tablet 3 times a day by oral route for 10 days., Disp: , Rfl:  .  metFORMIN (GLUCOPHAGE-XR) 500 MG 24 hr tablet, TAKE 3 TABLETS BY MOUTH DAILY WITH BREAKFAST, Disp: 270 tablet, Rfl: 0 .  mirabegron ER (MYRBETRIQ) 25 MG TB24 tablet, Take 1 tablet (25 mg total) by mouth daily., Disp: 90 tablet, Rfl: 3 .  omeprazole (PRILOSEC) 20 MG capsule, Take 20 mg by mouth daily.,  Disp: , Rfl:  .  sertraline (ZOLOFT) 100 MG tablet, Take 1 tablet (100 mg total) by mouth daily., Disp: 30 tablet, Rfl: 0   No Known Allergies   Social History   Tobacco Use  . Smoking status: Never Smoker  . Smokeless tobacco: Never Used  Vaping Use  . Vaping Use: Never used  Substance Use Topics  . Alcohol use: No    Alcohol/week: 0.0 standard drinks  . Drug use: No      Chart Review Today: I personally reviewed active problem list, medication list, allergies, family history, social history, health maintenance, notes from last encounter, lab results, imaging with the patient/caregiver today.   Review of Systems  Constitutional: Negative.   HENT: Negative.   Eyes: Negative.   Respiratory: Negative.   Cardiovascular: Negative.   Gastrointestinal: Negative.   Endocrine: Negative.   Genitourinary: Negative.   Musculoskeletal: Negative.   Skin: Negative.   Allergic/Immunologic: Negative.   Neurological: Negative.   Hematological: Negative.   Psychiatric/Behavioral: Negative for self-injury. The patient is nervous/anxious.   All other systems reviewed and are negative.      Objective:   Vitals:   09/28/20 0945  BP: 122/80  Pulse: 90  Resp: 16  Temp: 98.1 F (36.7 C)  SpO2: 96%  Weight: (!) 366 lb 3.2 oz (166.1 kg)  Height: 5' 9"  (1.753 m)    Body mass index is 54.08 kg/m.  Physical Exam Vitals and nursing note reviewed.  Constitutional:      General: She is not in acute distress.    Appearance: Normal appearance. She is well-developed and well-groomed. She is morbidly obese. She is not ill-appearing, toxic-appearing or diaphoretic.     Interventions: Face mask in place.  HENT:     Head: Normocephalic and atraumatic.     Right Ear: External ear normal.     Left Ear: External ear normal.  Eyes:     General: Lids are normal. No scleral icterus.       Right eye: No discharge.        Left eye: No discharge.     Conjunctiva/sclera: Conjunctivae normal.   Neck:     Trachea: Phonation normal. No tracheal deviation.  Cardiovascular:     Rate and Rhythm: Normal rate and regular rhythm.     Pulses: Normal pulses.          Radial pulses are 2+ on the right side and 2+ on the left side.       Posterior tibial pulses are 2+ on the right side and 2+ on the left side.     Heart sounds: Normal heart sounds. No murmur heard. No friction rub. No gallop.   Pulmonary:     Effort: Pulmonary effort is normal. No respiratory distress.     Breath sounds: Normal breath sounds. No stridor. No wheezing, rhonchi or  rales.  Chest:     Chest wall: No tenderness.  Abdominal:     General: There is no distension.     Palpations: Abdomen is soft.  Musculoskeletal:     Right lower leg: No edema.     Left lower leg: No edema.  Skin:    General: Skin is warm and dry.     Coloration: Skin is not jaundiced or pale.     Findings: No rash.  Neurological:     Mental Status: She is alert. Mental status is at baseline.     Motor: No abnormal muscle tone.     Gait: Gait normal.  Psychiatric:        Attention and Perception: Attention normal.        Mood and Affect: Mood and affect normal.        Speech: Speech normal.        Behavior: Behavior normal. Behavior is cooperative.        Thought Content: Thought content normal. Thought content does not include homicidal or suicidal ideation. Thought content does not include homicidal or suicidal plan.      Results for orders placed or performed in visit on 05/24/20  Novel Coronavirus, NAA ([facility])   Specimen: Nasopharyngeal(NP) swabs in vial transport medium   Nasopharynge  Screenin  Result Value Ref Range   SARS-CoV-2, NAA Detected (A) Not Detected  SARS-COV-2, NAA 2 DAY TAT   Nasopharynge  Screenin  Result Value Ref Range   SARS-CoV-2, NAA 2 DAY TAT Performed        Assessment & Plan:     ICD-10-CM   1. Hypertension goal BP (blood pressure) < 140/90  I10 amLODipine (NORVASC) 10 MG tablet     hydrochlorothiazide (HYDRODIURIL) 25 MG tablet    COMPLETE METABOLIC PANEL WITH GFR   BP at goal today on norvasc and HCTZ, likely has T2DM, will want to change meds add ACEI/ARB - will review labs and f/up closely, no labs since 2019  2. Pre-diabetes  R73.03 Hemoglobin A1C    COMPLETE METABOLIC PANEL WITH GFR   likely has T2DM  3. Hyperlipidemia LDL goal <100  K48.1 COMPLETE METABOLIC PANEL WITH GFR    Lipid panel  4. Current mild episode of major depressive disorder, unspecified whether recurrent (HCC)  F32.0 sertraline (ZOLOFT) 100 MG tablet    busPIRone (BUSPAR) 5 MG tablet    hydrOXYzine (VISTARIL) 25 MG capsule   mood worse with recent stressors, trial of buspar and hydroxyzine prn, close f/up, will increase zoloft dose if mood worsening, advised self care, therapy  5. Situational stress  F43.9 busPIRone (BUSPAR) 5 MG tablet    hydrOXYzine (VISTARIL) 25 MG capsule   worse stress and anxiety with husband having to go to prison for 2.5 years, going in end of month  6. Obesity, morbid, BMI 50 or higher (HCC)  E66.01 amLODipine (NORVASC) 10 MG tablet    hydrochlorothiazide (HYDRODIURIL) 25 MG tablet    Hemoglobin A1C    COMPLETE METABOLIC PANEL WITH GFR    Lipid panel  7. Encounter for hepatitis C screening test for low risk patient  Z11.59 Hepatitis C Antibody  8. Screening for HIV without presence of risk factors  Z11.4 HIV antibody (with reflex)  9. Screening for colon cancer  Z12.11 Cologuard  10. Screening for tuberculosis  Z11.1 QuantiFERON-TB Gold Plus  11. Encounter for medication monitoring  Z51.81 Hemoglobin A1C    COMPLETE METABOLIC PANEL WITH GFR    Lipid panel  CBC with Differential/Platelet  12. Encounter for completion of form with patient  Z02.89    work/employement forms, screening, TB testing  13. GERD without esophagitis  K21.9    5+ years managed on prilosec, cannot stop PPI, had prior GI work up  14. Allergic rhinitis, unspecified seasonality, unspecified  trigger  J30.9    on OTC meds, currently well controlled  15. OAB (overactive bladder)  N32.81    managed by specialists        Delsa Grana, PA-C 09/28/20 10:04 AM

## 2020-09-29 ENCOUNTER — Other Ambulatory Visit: Payer: Self-pay | Admitting: Family Medicine

## 2020-09-30 LAB — COMPLETE METABOLIC PANEL WITH GFR
AG Ratio: 1.5 (calc) (ref 1.0–2.5)
ALT: 29 U/L (ref 6–29)
AST: 23 U/L (ref 10–35)
Albumin: 4.3 g/dL (ref 3.6–5.1)
Alkaline phosphatase (APISO): 64 U/L (ref 31–125)
BUN: 20 mg/dL (ref 7–25)
CO2: 28 mmol/L (ref 20–32)
Calcium: 9.4 mg/dL (ref 8.6–10.2)
Chloride: 103 mmol/L (ref 98–110)
Creat: 1 mg/dL (ref 0.50–1.10)
GFR, Est African American: 77 mL/min/{1.73_m2} (ref >=60)
GFR, Est Non African American: 66 mL/min/{1.73_m2} (ref >=60)
Globulin: 2.9 g/dL (calc) (ref 1.9–3.7)
Glucose, Bld: 107 mg/dL — ABNORMAL HIGH (ref 65–99)
Potassium: 4 mmol/L (ref 3.5–5.3)
Sodium: 142 mmol/L (ref 135–146)
Total Bilirubin: 0.3 mg/dL (ref 0.2–1.2)
Total Protein: 7.2 g/dL (ref 6.1–8.1)

## 2020-09-30 LAB — CBC WITH DIFFERENTIAL/PLATELET
Absolute Monocytes: 426 cells/uL (ref 200–950)
Basophils Absolute: 64 cells/uL (ref 0–200)
Basophils Relative: 0.9 %
Eosinophils Absolute: 462 cells/uL (ref 15–500)
Eosinophils Relative: 6.5 %
HCT: 38 % (ref 35.0–45.0)
Hemoglobin: 12.1 g/dL (ref 11.7–15.5)
Lymphs Abs: 2414 cells/uL (ref 850–3900)
MCH: 25.6 pg — ABNORMAL LOW (ref 27.0–33.0)
MCHC: 31.8 g/dL — ABNORMAL LOW (ref 32.0–36.0)
MCV: 80.5 fL (ref 80.0–100.0)
MPV: 11.5 fL (ref 7.5–12.5)
Monocytes Relative: 6 %
Neutro Abs: 3735 cells/uL (ref 1500–7800)
Neutrophils Relative %: 52.6 %
Platelets: 279 10*3/uL (ref 140–400)
RBC: 4.72 10*6/uL (ref 3.80–5.10)
RDW: 14.8 % (ref 11.0–15.0)
Total Lymphocyte: 34 %
WBC: 7.1 10*3/uL (ref 3.8–10.8)

## 2020-09-30 LAB — HEMOGLOBIN A1C
Hgb A1c MFr Bld: 7.2 % of total Hgb — ABNORMAL HIGH (ref <5.7)
Mean Plasma Glucose: 160 mg/dL
eAG (mmol/L): 8.9 mmol/L

## 2020-09-30 LAB — QUANTIFERON-TB GOLD PLUS
Mitogen-NIL: >10 IU/mL
NIL: 0.03 IU/mL
QuantiFERON-TB Gold Plus: NEGATIVE
TB1-NIL: 0.01 IU/mL
TB2-NIL: 0.02 IU/mL

## 2020-09-30 LAB — LIPID PANEL
Cholesterol: 176 mg/dL (ref <200)
HDL: 55 mg/dL (ref >=50)
LDL Cholesterol (Calc): 95 mg/dL (calc)
Non-HDL Cholesterol (Calc): 121 mg/dL (calc) (ref <130)
Total CHOL/HDL Ratio: 3.2 (calc) (ref <5.0)
Triglycerides: 154 mg/dL — ABNORMAL HIGH (ref <150)

## 2020-09-30 LAB — HEPATITIS C ANTIBODY
Hepatitis C Ab: NONREACTIVE
SIGNAL TO CUT-OFF: 0.01 (ref <1.00)

## 2020-09-30 LAB — HIV ANTIBODY (ROUTINE TESTING W REFLEX): HIV 1&2 Ab, 4th Generation: NONREACTIVE

## 2020-10-01 ENCOUNTER — Encounter: Payer: Self-pay | Admitting: Family Medicine

## 2020-10-01 DIAGNOSIS — E1165 Type 2 diabetes mellitus with hyperglycemia: Secondary | ICD-10-CM | POA: Insufficient documentation

## 2020-10-01 DIAGNOSIS — IMO0002 Reserved for concepts with insufficient information to code with codable children: Secondary | ICD-10-CM | POA: Insufficient documentation

## 2020-11-02 DIAGNOSIS — Z1211 Encounter for screening for malignant neoplasm of colon: Secondary | ICD-10-CM | POA: Diagnosis not present

## 2020-11-09 ENCOUNTER — Ambulatory Visit: Payer: Medicaid Other | Admitting: Family Medicine

## 2020-11-09 LAB — COLOGUARD
COLOGUARD: NEGATIVE
Cologuard: NEGATIVE

## 2020-11-12 ENCOUNTER — Encounter (INDEPENDENT_AMBULATORY_CARE_PROVIDER_SITE_OTHER): Payer: Medicaid Other | Admitting: Podiatry

## 2020-11-12 ENCOUNTER — Other Ambulatory Visit: Payer: Self-pay

## 2020-11-12 DIAGNOSIS — M778 Other enthesopathies, not elsewhere classified: Secondary | ICD-10-CM

## 2020-11-16 ENCOUNTER — Other Ambulatory Visit: Payer: Self-pay

## 2020-11-16 ENCOUNTER — Ambulatory Visit (INDEPENDENT_AMBULATORY_CARE_PROVIDER_SITE_OTHER): Payer: Medicaid Other | Admitting: Podiatry

## 2020-11-16 ENCOUNTER — Encounter: Payer: Self-pay | Admitting: Podiatry

## 2020-11-16 DIAGNOSIS — M778 Other enthesopathies, not elsewhere classified: Secondary | ICD-10-CM

## 2020-11-16 MED ORDER — BETAMETHASONE SOD PHOS & ACET 6 (3-3) MG/ML IJ SUSP
3.0000 mg | Freq: Once | INTRAMUSCULAR | Status: AC
  Administered 2020-11-16: 3 mg via INTRA_ARTICULAR

## 2020-11-16 NOTE — Progress Notes (Signed)
   HPI: 50 year old female presenting today for follow up evaluation of throbbing left foot pain.  Patient states that the injection did help for quite a few months.  She states that slowly the pain has returned.  She most recently was prescribed meloxicam for different arthritic pains and she has been taking it daily as prescribed.  She presents for further treatment evaluation  Past Medical History:  Diagnosis Date   Anxiety    Arthritis    Breast cancer ([place]) 2016   Depression    Hot flashes    Hypertension    OSA on CPAP 11/03/2016   Personal history of radiation therapy      Physical Exam: General: The patient is alert and oriented x3 in no acute distress.  Dermatology: Skin is warm, dry and supple bilateral lower extremities. Negative for open lesions or macerations.  There is some hyperkeratotic preulcerative callus tissue to the lateral aspect of the fifth MTPJ left foot.  Associated tenderness to palpation  Vascular: Palpable pedal pulses bilaterally. No edema or erythema noted. Capillary refill within normal limits.  Neurological: Epicritic and protective threshold grossly intact bilaterally.   Musculoskeletal Exam: Pain with palpation to the dorsal aspect of the left foot.  Range of motion within normal limits to all pedal and ankle joints bilateral. Muscle strength 5/5 in all groups bilateral.    Assessment: 1. Capsulitis left foot 2.  Callus/porokeratosis fifth MTPJ left   Plan of Care:  1. Patient evaluated. X-Rays reviewed.  2. Injection of 0.5 mLs Celestone Soluspan injected into the dorsal left midtarsal joint.  3. Continue taking meloxicam daily as prescribed  4. Recommended good shoe gear.  5.  Excisional debridement of the hyperkeratotic callus tissue was performed using a tissue nipper without incident or bleeding  6.  Return to clinic as needed.   Daycare teacher standing all day.     Edrick Kins, DPM [facility]  Dr. Edrick Kins,  DPM    [address], [place] [address]                Office [phone]  Fax [phone]

## 2020-12-02 NOTE — Progress Notes (Signed)
Patient left without being seen.  This encounter was created in error - please disregard.

## 2020-12-13 ENCOUNTER — Other Ambulatory Visit: Payer: Self-pay | Admitting: Urology

## 2020-12-30 ENCOUNTER — Other Ambulatory Visit: Payer: Self-pay | Admitting: Family Medicine

## 2020-12-30 DIAGNOSIS — F320 Major depressive disorder, single episode, mild: Secondary | ICD-10-CM

## 2021-02-11 ENCOUNTER — Encounter: Payer: Self-pay | Admitting: General Surgery

## 2021-03-17 NOTE — Progress Notes (Deleted)
03/18/2021 8:35 AM   Elizabeth Hoffman 02/02/1971 [id number]  Referring provider: Towanda Malkin, MD [place],  [place] [address]  No chief complaint on file.  Urological history: 1. OAB -contributing factors of diabetes, depression, vaginal atrophy, pelvic surgery, antihistamines, decongestants and diuretics  -cysto 2019-NED -PVR *** -managed with Myrbetriq 25 mg daily and Toviaz 4 mg daily  2. Incontinence -contributing factors of obesity, diabetes, depression, vaginal atrophy, pelvic surgery, antihistamines, decongestants and diuretics   3. Nocturia -contributing factors of obstructive sleep apnea, hypertension, diabetes, anxiety and depression  HPI: Elizabeth Hoffman is a 50 y.o. female who presents today for yearly follow up.  PVR ***  PMH: Past Medical History:  Diagnosis Date   Anxiety    Arthritis    Breast cancer ([place]) 2016   Depression    Hot flashes    Hypertension    OSA on CPAP 11/03/2016   Personal history of radiation therapy     Surgical History: Past Surgical History:  Procedure Laterality Date   ABDOMINAL HYSTERECTOMY  06/15/2017   BREAST EXCISIONAL BIOPSY Left 03/18/2015   breast ca +   BREAST LUMPECTOMY WITH RADIOACTIVE SEED LOCALIZATION Left 03/18/2015   Procedure: BREAST LUMPECTOMY WITH RADIOACTIVE SEED LOCALIZATION;  Surgeon: Erroll Luna, MD;  Location: [facility];  Service: General;  Laterality: Left;   CARPAL TUNNEL RELEASE Bilateral 04/16/2020   CERVICAL BIOPSY  W/ LOOP ELECTRODE EXCISION     CHOLECYSTECTOMY     HERNIA REPAIR     MASTECTOMY, PARTIAL Left 02/15/2015   NASAL SEPTUM SURGERY     TONSILLECTOMY AND ADENOIDECTOMY     UVULECTOMY      Home Medications:  Allergies as of 03/18/2021   No Known Allergies      Medication List        Accurate as of March 17, 2021  8:35 AM. If you have any questions, ask your nurse or doctor.          amLODipine 10 MG tablet Commonly  known as: NORVASC Take 1 tablet (10 mg total) by mouth daily.   busPIRone 5 MG tablet Commonly known as: BUSPAR Take 1-2 tablets (5-10 mg total) by mouth 3 (three) times daily as needed (stress/anxiety).   fesoterodine 4 MG Tb24 tablet Commonly known as: Toviaz Take 1 tablet (4 mg total) by mouth daily.   fluticasone 50 MCG/ACT nasal spray Commonly known as: FLONASE Place 2 sprays into both nostrils daily. Can use twice daily for the first 3 days, then return to once daily   hydrochlorothiazide 25 MG tablet Commonly known as: HYDRODIURIL Take 1 tablet (25 mg total) by mouth daily.   hydrOXYzine 25 MG capsule Commonly known as: VISTARIL Can take 25 mg PO up to TID for anxiety and can take 25-50 mg as needed at bedtime for insomnia   ibuprofen 800 MG tablet Commonly known as: ADVIL ibuprofen 800 mg tablet  Take 1 tablet 3 times a day by oral route for 10 days.   metFORMIN 500 MG 24 hr tablet Commonly known as: GLUCOPHAGE-XR TAKE 3 TABLETS BY MOUTH DAILY WITH BREAKFAST   mirabegron ER 25 MG Tb24 tablet Commonly known as: MYRBETRIQ Take 1 tablet (25 mg total) by mouth daily.   omeprazole 20 MG capsule Commonly known as: PRILOSEC Take 20 mg by mouth daily.   sertraline 100 MG tablet Commonly known as: ZOLOFT TAKE ONE TABLET BY MOUTH EVERY DAY        Allergies:  No Known Allergies  Family History: Family History  Adopted: Yes  Problem Relation Age of Onset   Breast cancer Neg Hx     Social History:  reports that she has never smoked. She has never used smokeless tobacco. She reports that she does not drink alcohol and does not use drugs.  ROS: For pertinent review of systems please refer to history of present illness  Physical Exam: LMP 02/18/2015 (Approximate)   Constitutional:  Well nourished. Alert and oriented, No acute distress. HEENT: [place] AT, moist mucus membranes.  Trachea midline, no masses. Cardiovascular: No clubbing, cyanosis, or  edema. Respiratory: Normal respiratory effort, no increased work of breathing. GI: Abdomen is soft, non tender, non distended, no abdominal masses. Liver and spleen not palpable.  No hernias appreciated.  Stool sample for occult testing is not indicated.   GU: No CVA tenderness.  No bladder fullness or masses.  *** external genitalia, *** pubic hair distribution, no lesions.  Normal urethral meatus, no lesions, no prolapse, no discharge.   No urethral masses, tenderness and/or tenderness. No bladder fullness, tenderness or masses. *** vagina mucosa, *** estrogen effect, no discharge, no lesions, *** pelvic support, *** cystocele and *** rectocele noted.  No cervical motion tenderness.  Uterus is freely mobile and non-fixed.  No adnexal/parametria masses or tenderness noted.  Anus and perineum are without rashes or lesions.   ***  Skin: No rashes, bruises or suspicious lesions. Lymph: No cervical or inguinal adenopathy. Neurologic: Grossly intact, no focal deficits, moving all 4 extremities. Psychiatric: Normal mood and affect.    Laboratory Data: N/A     Pertinent Imaging: ***   Assessment & Plan:    1. Urgency Patient is at goal on Myrbetriq 25 mg daily and Toviaz 4 mg daily-I have sent refills of these medications to [facility] She will return in 1 year for OAB questionnaire and PVR  2. Incontinence See above  3. Nocturia See above   No follow-ups on file.  These notes generated with voice recognition software. I apologize for typographical errors.  Zara Council, PA-C  [facility] [address]  [place], [place] [address] [phone]

## 2021-03-18 ENCOUNTER — Encounter: Payer: Self-pay | Admitting: Urology

## 2021-03-18 ENCOUNTER — Ambulatory Visit: Payer: Self-pay | Admitting: Urology

## 2021-03-18 DIAGNOSIS — N3946 Mixed incontinence: Secondary | ICD-10-CM

## 2021-03-18 DIAGNOSIS — R3915 Urgency of urination: Secondary | ICD-10-CM

## 2021-03-18 DIAGNOSIS — R351 Nocturia: Secondary | ICD-10-CM

## 2021-03-21 ENCOUNTER — Other Ambulatory Visit: Payer: Self-pay | Admitting: Urology

## 2021-03-21 DIAGNOSIS — N3946 Mixed incontinence: Secondary | ICD-10-CM

## 2021-03-21 DIAGNOSIS — R351 Nocturia: Secondary | ICD-10-CM

## 2021-03-21 DIAGNOSIS — R3915 Urgency of urination: Secondary | ICD-10-CM

## 2021-03-28 ENCOUNTER — Other Ambulatory Visit: Payer: Self-pay | Admitting: Family Medicine

## 2021-03-28 ENCOUNTER — Other Ambulatory Visit: Payer: Self-pay | Admitting: Urology

## 2021-03-28 DIAGNOSIS — F320 Major depressive disorder, single episode, mild: Secondary | ICD-10-CM

## 2021-03-28 DIAGNOSIS — R351 Nocturia: Secondary | ICD-10-CM

## 2021-03-28 DIAGNOSIS — R3915 Urgency of urination: Secondary | ICD-10-CM

## 2021-03-28 DIAGNOSIS — N3946 Mixed incontinence: Secondary | ICD-10-CM

## 2021-03-30 NOTE — Progress Notes (Signed)
03/31/2021 4:23 PM   Elizabeth Hoffman 05/12/1971 [id number]  Referring provider: Delsa Grana, PA-C [address] [place],  [place] [address]  Chief Complaint  Patient presents with   Urinary Incontinence    Urological history: 1. OAB -contributing factors of diabetes, depression, vaginal atrophy, pelvic surgery, antihistamines, decongestants and diuretics  -cysto 2019-NED -PVR 0 mL -managed with Myrbetriq 25 mg daily and Toviaz 4 mg daily  2. Incontinence -contributing factors of obesity, diabetes, depression, vaginal atrophy, pelvic surgery, antihistamines, decongestants and diuretics   3. Nocturia -contributing factors of obstructive sleep apnea, hypertension, diabetes, anxiety and depression -on CPAP   HPI: Elizabeth Hoffman is a 50 y.o. female who presents today for yearly follow up.  PVR 0 mL   She has been experiencing 8 or more daytime voids, 3 or more night time voids and a strong urge to urinate.  She has been having leakage with urgency and stress.  She leaks 1-2 times weekly.  She is wearing 1-2 absorbent pads daily.  She does engage in toilet mapping and does restrict fluid intake at times.  Patient denies any modifying or aggravating factors.  Patient denies any gross hematuria, dysuria or suprapubic/flank pain.  Patient denies any fevers, chills, nausea or vomiting.    She has been without her Myrbetriq 25 mg daily and Toviaz 4 mg daily for one month.    PMH: Past Medical History:  Diagnosis Date   Anxiety    Arthritis    Breast cancer ([place]) 2016   Depression    Hot flashes    Hypertension    OSA on CPAP 11/03/2016   Personal history of radiation therapy     Surgical History: Past Surgical History:  Procedure Laterality Date   ABDOMINAL HYSTERECTOMY  06/15/2017   BREAST EXCISIONAL BIOPSY Left 03/18/2015   breast ca +   BREAST LUMPECTOMY WITH RADIOACTIVE SEED LOCALIZATION Left 03/18/2015   Procedure: BREAST LUMPECTOMY WITH  RADIOACTIVE SEED LOCALIZATION;  Surgeon: Erroll Luna, MD;  Location: [facility];  Service: General;  Laterality: Left;   CARPAL TUNNEL RELEASE Bilateral 04/16/2020   CERVICAL BIOPSY  W/ LOOP ELECTRODE EXCISION     CHOLECYSTECTOMY     HERNIA REPAIR     MASTECTOMY, PARTIAL Left 02/15/2015   NASAL SEPTUM SURGERY     TONSILLECTOMY AND ADENOIDECTOMY     UVULECTOMY      Home Medications:  Allergies as of 03/31/2021   No Known Allergies      Medication List        Accurate as of March 31, 2021  4:23 PM. If you have any questions, ask your nurse or doctor.          STOP taking these medications    ibuprofen 800 MG tablet Commonly known as: ADVIL Stopped by: Zara Council, PA-C       TAKE these medications    amLODipine 10 MG tablet Commonly known as: NORVASC Take 1 tablet (10 mg total) by mouth daily.   busPIRone 5 MG tablet Commonly known as: BUSPAR Take 1-2 tablets (5-10 mg total) by mouth 3 (three) times daily as needed (stress/anxiety).   fesoterodine 4 MG Tb24 tablet Commonly known as: Toviaz Take 1 tablet (4 mg total) by mouth daily.   fluticasone 50 MCG/ACT nasal spray Commonly known as: FLONASE Place 2 sprays into both nostrils daily. Can use twice daily for the first 3 days, then return to once daily   hydrochlorothiazide 25 MG tablet  Commonly known as: HYDRODIURIL Take 1 tablet (25 mg total) by mouth daily.   hydrOXYzine 25 MG capsule Commonly known as: VISTARIL Can take 25 mg PO up to TID for anxiety and can take 25-50 mg as needed at bedtime for insomnia   metFORMIN 500 MG 24 hr tablet Commonly known as: GLUCOPHAGE-XR TAKE 3 TABLETS BY MOUTH DAILY WITH BREAKFAST   mirabegron ER 50 MG Tb24 tablet Commonly known as: MYRBETRIQ Take 1 tablet (50 mg total) by mouth daily. What changed:  medication strength how much to take Changed by: Valda Christenson, PA-C   omeprazole 20 MG capsule Commonly known as: PRILOSEC Take 20  mg by mouth daily.   sertraline 100 MG tablet Commonly known as: ZOLOFT TAKE ONE TABLET BY MOUTH EVERY DAY        Allergies: No Known Allergies  Family History: Family History  Adopted: Yes  Problem Relation Age of Onset   Breast cancer Neg Hx     Social History:  reports that she has never smoked. She has never used smokeless tobacco. She reports that she does not drink alcohol and does not use drugs.  ROS: For pertinent review of systems please refer to history of present illness  Physical Exam: BP (!) 178/84   Pulse 91   Ht 5' 9"  (1.753 m)   Wt (!) 366 lb (166 kg)   LMP 02/18/2015 (Approximate)   BMI 54.05 kg/m   Constitutional:  Well nourished. Alert and oriented, No acute distress. HEENT: [place] AT, mask in place.  Trachea midline Cardiovascular: No clubbing, cyanosis, or edema. Respiratory: Normal respiratory effort, no increased work of breathing. Neurologic: Grossly intact, no focal deficits, moving all 4 extremities. Psychiatric: Normal mood and affect.    Laboratory Data: Component     Latest Ref Rng & Units 09/28/2020  WBC     3.8 - 10.8 Thousand/uL 7.1  RBC     3.80 - 5.10 Million/uL 4.72  Hemoglobin     11.7 - 15.5 g/dL 12.1  HCT     35.0 - 45.0 % 38.0  MCV     80.0 - 100.0 fL 80.5  MCH     27.0 - 33.0 pg 25.6 (L)  MCHC     32.0 - 36.0 g/dL 31.8 (L)  RDW     11.0 - 15.0 % 14.8  Platelets     140 - 400 Thousand/uL 279  MPV     7.5 - 12.5 fL 11.5  NEUT#     1,500 - 7,800 cells/uL 3,735  Lymphocyte #     850 - 3,900 cells/uL 2,414  Absolute Monocytes     200 - 950 cells/uL 426  Eosinophils Absolute     15 - 500 cells/uL 462  Basophils Absolute     0 - 200 cells/uL 64  Neutrophils     % 52.6  Total Lymphocyte     % 34.0  Monocytes Relative     % 6.0  Eosinophil     % 6.5  Basophil     % 0.9  WBC, UA     0 - 5 /hpf   Epithelial Cells (non renal)     0 - 10 /hpf   Bacteria, UA     None seen/Few   Yeast, UA     None seen     Component     Latest Ref Rng & Units 09/28/2020  Glucose     65 - 99 mg/dL 107 (H)  BUN  7 - 25 mg/dL 20  Creatinine     0.50 - 1.10 mg/dL 1.00  GFR, Est Non African American     > OR = 60 mL/min/1.43m 66  GFR, Est African American     > OR = 60 mL/min/1.724m77  BUN/Creatinine Ratio     6 - 22 (calc) NOT APPLICABLE  Sodium     13761 146 mmol/L 142  Potassium     3.5 - 5.3 mmol/L 4.0  Chloride     98 - 110 mmol/L 103  CO2     20 - 32 mmol/L 28  Calcium     8.6 - 10.2 mg/dL 9.4  Total Protein     6.1 - 8.1 g/dL 7.2  Albumin MSPROF     3.6 - 5.1 g/dL 4.3  Globulin     1.9 - 3.7 g/dL (calc) 2.9  AG Ratio     1.0 - 2.5 (calc) 1.5  Total Bilirubin     0.2 - 1.2 mg/dL 0.3  Alkaline phosphatase (APISO)     31 - 125 U/L 64  AST     10 - 35 U/L 23  ALT     6 - 29 U/L 29   Component     Latest Ref Rng & Units 09/28/2020  Hemoglobin A1C     <5.7 % of total Hgb 7.2 (H)  Mean Plasma Glucose     mg/dL 160  eAG (mmol/L)     mmol/L 8.9  I have reviewed the labs.      Pertinent Imaging: Component     Latest Ref Rng & Units 03/31/2021  Scan Result      0     Assessment & Plan:    1. Mixed incontinence -She states that her symptoms are manageable and taking Myrbetriq 25 mg daily and Toviaz 4 mg daily -She still experiences significant leakage, so we will have a trial of Myrbetriq 50 mg daily to see if her leaking episodes decrease -Myrbetriq 50 mg, #28 samples given-she will contact usKoreafter 3 weeks of samples regarding whether she wants her Myrbetriq increased to 50 mg daily or to remain on the Myrbetriq 25 mg daily  2. Nocturia -continue CPAP machine  3. Urgency -see # 1  Return in about 1 year (around 03/31/2022) for PVR and OAB questionnaire.  These notes generated with voice recognition software. I apologize for typographical errors.  SHZara CouncilPA-C  [facility] [address] DriveSuPerryvilleuTrenton[place]  [phone]

## 2021-03-31 ENCOUNTER — Encounter: Payer: Self-pay | Admitting: Urology

## 2021-03-31 ENCOUNTER — Other Ambulatory Visit: Payer: Self-pay

## 2021-03-31 ENCOUNTER — Ambulatory Visit: Payer: Medicaid Other | Admitting: Urology

## 2021-03-31 VITALS — BP 178/84 | HR 91 | Ht 69.0 in | Wt 366.0 lb

## 2021-03-31 DIAGNOSIS — N3946 Mixed incontinence: Secondary | ICD-10-CM | POA: Diagnosis not present

## 2021-03-31 DIAGNOSIS — R351 Nocturia: Secondary | ICD-10-CM

## 2021-03-31 DIAGNOSIS — R3915 Urgency of urination: Secondary | ICD-10-CM

## 2021-03-31 LAB — BLADDER SCAN AMB NON-IMAGING: Scan Result: 0

## 2021-03-31 MED ORDER — MIRABEGRON ER 50 MG PO TB24: 50.0000 mg | ORAL_TABLET | Freq: Every day | ORAL | 0 refills | Status: DC

## 2021-03-31 MED ORDER — FESOTERODINE FUMARATE ER 4 MG PO TB24: 4.0000 mg | ORAL_TABLET | Freq: Every day | ORAL | 3 refills | Status: DC

## 2021-04-26 ENCOUNTER — Other Ambulatory Visit: Payer: Self-pay | Admitting: Urology

## 2021-04-26 ENCOUNTER — Encounter: Payer: Self-pay | Admitting: Podiatry

## 2021-04-26 ENCOUNTER — Other Ambulatory Visit: Payer: Self-pay | Admitting: Podiatry

## 2021-04-26 DIAGNOSIS — N3946 Mixed incontinence: Secondary | ICD-10-CM

## 2021-04-26 MED ORDER — MIRABEGRON ER 50 MG PO TB24: 50.0000 mg | ORAL_TABLET | Freq: Every day | ORAL | 1 refills | Status: DC

## 2021-04-26 MED ORDER — MELOXICAM 15 MG PO TABS: 15.0000 mg | ORAL_TABLET | Freq: Every day | ORAL | 1 refills | Status: DC

## 2021-04-26 NOTE — Progress Notes (Signed)
PRN

## 2021-04-26 NOTE — Telephone Encounter (Signed)
Please advise 

## 2021-07-15 ENCOUNTER — Telehealth: Payer: Self-pay | Admitting: Family Medicine

## 2021-07-15 NOTE — Telephone Encounter (Signed)
Rx 09/29/20 #270 3RF- 1 year supply- too soon ?Requested Prescriptions  ?Pending Prescriptions Disp Refills  ?? metFORMIN (GLUCOPHAGE-XR) 500 MG 24 hr tablet [Pharmacy Med Name: METFORMIN HCL ER 500 MG TAB] 270 tablet 3  ?  Sig: TAKE 3 TABLETS BY MOUTH DAILY WITH BREAKFAST  ?  ? Endocrinology:  Diabetes - Biguanides Failed - 07/15/2021  1:19 PM  ?  ?  Failed - HBA1C is between 0 and 7.9 and within 180 days  ?  Hgb A1c MFr Bld  ?Date Value Ref Range Status  ?09/28/2020 7.2 (H) <5.7 % of total Hgb Final  ?  Comment:  ?  For someone without known diabetes, a hemoglobin A1c ?value of 6.5% or greater indicates that they may have  ?diabetes and this should be confirmed with a follow-up  ?test. ?. ?For someone with known diabetes, a value <7% indicates  ?that their diabetes is well controlled and a value  ?greater than or equal to 7% indicates suboptimal  ?control. A1c targets should be individualized based on  ?duration of diabetes, age, comorbid conditions, and  ?other considerations. ?. ?Currently, no consensus exists regarding use of ?hemoglobin A1c for diagnosis of diabetes for children. ?. ?  ?   ?  ?  Failed - B12 Level in normal range and within 720 days  ?  No results found for: VITAMINB12   ?  ?  Failed - Valid encounter within last 6 months  ?  Recent Outpatient Visits   ?      ? 9 months ago Hypertension goal BP (blood pressure) < 140/90  ? [facility] Delsa Grana, PA-C  ? 1 year ago COVID  ? [facility] Lebron Conners D, MD  ? 1 year ago Acute non-recurrent maxillary sinusitis  ? [facility] Lebron Conners D, MD  ? 1 year ago Acute bacterial conjunctivitis of left eye  ? [facility] Towanda Malkin, MD  ? 2 years ago Ritta Slot, oral  ? [facility] Towanda Malkin, MD  ?  ?  ?Future Appointments   ?        ? In 1 month Delsa Grana, PA-C [facility], [place]  ? In 9 months  [place] [place]  ?  ? ?  ?  ?  Passed - Cr in normal range and within 360 days  ?  Creatinine  ?Date Value Ref Range Status  ?02/24/2015 0.9 0.6 - 1.1 mg/dL Final  ? ?Creat  ?Date Value Ref Range Status  ?09/28/2020 1.00 0.50 - 1.10 mg/dL Final  ? ?Creatinine, Urine  ?Date Value Ref Range Status  ?01/21/2018 252 20 - 275 mg/dL Final  ?   ?  ?  Passed - eGFR in normal range and within 360 days  ?  GFR, Est African American  ?Date Value Ref Range Status  ?09/28/2020 77 > OR = 60 mL/min/1.106m Final  ? ?GFR, Est Non African American  ?Date Value Ref Range Status  ?09/28/2020 66 > OR = 60 mL/min/1.775mFinal  ? ?EGFR  ?Date Value Ref Range Status  ?02/24/2015 77 (L) >90 ml/min/1.73 m2 Final  ?  Comment:  ?  eGFR is calculated using the CKD-EPI Creatinine Equation (2009)  ?   ?  ?  Passed - CBC within normal limits and completed in the last 12 months  ?  WBC  ?Date Value Ref Range Status  ?09/28/2020 7.1  3.8 - 10.8 Thousand/uL Final  ? ?RBC  ?Date Value Ref Range Status  ?09/28/2020 4.72 3.80 - 5.10 Million/uL Final  ? ?Hemoglobin  ?Date Value Ref Range Status  ?09/28/2020 12.1 11.7 - 15.5 g/dL Final  ? ?HGB  ?Date Value Ref Range Status  ?02/24/2015 12.8 11.6 - 15.9 g/dL Final  ? ?HCT  ?Date Value Ref Range Status  ?09/28/2020 38.0 35.0 - 45.0 % Final  ?02/24/2015 40.1 34.8 - 46.6 % Final  ? ?MCHC  ?Date Value Ref Range Status  ?09/28/2020 31.8 (L) 32.0 - 36.0 g/dL Final  ? ?MCH  ?Date Value Ref Range Status  ?09/28/2020 25.6 (L) 27.0 - 33.0 pg Final  ? ?MCV  ?Date Value Ref Range Status  ?09/28/2020 80.5 80.0 - 100.0 fL Final  ?02/24/2015 85.0 79.5 - 101.0 fL Final  ? ?No results found for: PLTCOUNTKUC, LABPLAT, [place] ?RDW  ?Date Value Ref Range Status  ?09/28/2020 14.8 11.0 - 15.0 % Final  ?02/24/2015 15.0 (H) 11.2 - 14.5 % Final  ? ?  ?  ?  ? ?

## 2021-07-20 ENCOUNTER — Other Ambulatory Visit: Payer: Self-pay | Admitting: Urology

## 2021-07-20 ENCOUNTER — Other Ambulatory Visit: Payer: Self-pay | Admitting: Podiatry

## 2021-07-20 ENCOUNTER — Other Ambulatory Visit: Payer: Self-pay | Admitting: Family Medicine

## 2021-07-20 DIAGNOSIS — F320 Major depressive disorder, single episode, mild: Secondary | ICD-10-CM

## 2021-07-20 DIAGNOSIS — I10 Essential (primary) hypertension: Secondary | ICD-10-CM

## 2021-08-22 ENCOUNTER — Ambulatory Visit: Payer: Medicaid Other | Admitting: Family Medicine

## 2021-08-26 ENCOUNTER — Ambulatory Visit: Payer: Medicaid Other | Admitting: Family Medicine

## 2021-08-29 ENCOUNTER — Ambulatory Visit: Payer: Medicaid Other | Admitting: Family Medicine

## 2021-08-29 ENCOUNTER — Encounter: Payer: Self-pay | Admitting: Family Medicine

## 2021-08-29 VITALS — BP 126/68 | HR 95 | Temp 98.4°F | Resp 16 | Ht 69.0 in | Wt 375.6 lb

## 2021-08-29 DIAGNOSIS — Z23 Encounter for immunization: Secondary | ICD-10-CM | POA: Diagnosis not present

## 2021-08-29 DIAGNOSIS — I10 Essential (primary) hypertension: Secondary | ICD-10-CM

## 2021-08-29 DIAGNOSIS — F320 Major depressive disorder, single episode, mild: Secondary | ICD-10-CM | POA: Diagnosis not present

## 2021-08-29 DIAGNOSIS — Z5181 Encounter for therapeutic drug level monitoring: Secondary | ICD-10-CM | POA: Diagnosis not present

## 2021-08-29 DIAGNOSIS — E785 Hyperlipidemia, unspecified: Secondary | ICD-10-CM

## 2021-08-29 DIAGNOSIS — Z1231 Encounter for screening mammogram for malignant neoplasm of breast: Secondary | ICD-10-CM

## 2021-08-29 DIAGNOSIS — K219 Gastro-esophageal reflux disease without esophagitis: Secondary | ICD-10-CM | POA: Diagnosis not present

## 2021-08-29 DIAGNOSIS — R7303 Prediabetes: Secondary | ICD-10-CM | POA: Diagnosis not present

## 2021-08-29 DIAGNOSIS — E119 Type 2 diabetes mellitus without complications: Secondary | ICD-10-CM

## 2021-08-29 MED ORDER — SHINGRIX 50 MCG/0.5ML IM SUSR: 0.5000 mL | Freq: Once | INTRAMUSCULAR | 0 refills | Status: AC

## 2021-08-29 NOTE — Progress Notes (Signed)
? ?Name: Elizabeth Hoffman   MRN: [id number]    DOB: [birth year]   Date:08/29/2021 ? ?     Progress Note ? ?Chief Complaint  ?Patient presents with  ? Follow-up  ? PreDM  ? Hypertension  ? Gastroesophageal Reflux  ? Hyperlipidemia  ? ? ? ?Subjective:  ? ?Elizabeth Hoffman is a 51 y.o. female, presents to clinic for routine follow-up, it has been about 1 year since she has been in office ?May 2022 was the first time I met her after her PCP left the clinic and she has not been in since ? ?DM:   ?Pt managing DM with metformin extended release she is taking 3-500 mg once daily for total of 1500 mg once in the morning ?She reports good medication compliance no side effects or concerns, she does not check her blood sugar ?Denies: Polyuria, polydipsia, vision changes, neuropathy, hypoglycemia ?Recent pertinent labs: ?Lab Results  ?Component Value Date  ? HGBA1C 7.2 (H) 09/28/2020  ? HGBA1C 6.3 (H) 01/21/2018  ? HGBA1C 6.4 (A) 10/05/2017  ? ?Lab Results  ?Component Value Date  ? MICROALBUR 0.8 01/21/2018  ? [place] 95 09/28/2020  ? CREATININE 1.00 09/28/2020  ? ?Standard of care and health maintenance: ?Urine Microalbumin: Due ?Foot exam: Due and done today ?DM eye exam: Due ?ACEI/ARB: No ?Statin:  no ? ?Hypertension:  ?Currently managed on HCTZ 25 mg and amlodipine 10 mg daily ?Pt reports good med compliance and denies any SE.   ?Blood pressure today is above goal with initial vital signs but when repeated prior to leaving clinic it improved to 126/68 -well controlled. ?BP Readings from Last 3 Encounters:  ?08/29/21 138/72  ?03/31/21 (!) 178/84  ?09/28/20 122/80  ? ?Pt denies CP, SOB, exertional sx, LE edema, palpitation, Ha's, visual disturbances, lightheadedness, hypotension, syncope. ?She does experience intermittent lower extremity edema only when on long car rides ? ?Anxiety and depression she feels that this is much better controlled than last year, she got BuSpar and hydroxyzine but never even took the medications and  has continued on zoloft 100 mg doing well, less situational stress  ? ?  08/29/2021  ? 11:07 AM 09/28/2020  ?  9:46 AM 06/02/2020  ? 12:56 PM  ?Depression screen PHQ 2/9  ?Decreased Interest 0 1 0  ?Down, Depressed, Hopeless 0 1 0  ?PHQ - 2 Score 0 2 0  ?Altered sleeping 0 3 0  ?Tired, decreased energy 0 2 0  ?Change in appetite 0 1 0  ?Feeling bad or failure about yourself  0 1 0  ?Trouble concentrating 0 1 0  ?Moving slowly or fidgety/restless 0 0 0  ?Suicidal thoughts 0 0 0  ?PHQ-9 Score 0 10 0  ?Difficult doing work/chores Not difficult at all Somewhat difficult   ? ? ?  09/28/2020  ?  9:52 AM  ?GAD 7 : Generalized Anxiety Score  ?Nervous, Anxious, on Edge 1  ?Control/stop worrying 3  ?Worry too much - different things 3  ?Trouble relaxing 1  ?Restless 1  ?Easily annoyed or irritable 2  ?Afraid - awful might happen 2  ?Total GAD 7 Score 13  ?Anxiety Difficulty Somewhat difficult  ? ?Obesity: ?She has gained some weight, has been working on diet, is not exercising ?Wt Readings from Last 5 Encounters:  ?08/29/21 (!) 375 lb 9.6 oz (170.4 kg)  ?03/31/21 (!) 366 lb (166 kg)  ?09/28/20 (!) 366 lb 3.2 oz (166.1 kg)  ?03/12/20 (!) 330 lb (149.7 kg)  ?09/08/19 Marland Kitchen)  330 lb (149.7 kg)  ? ?BMI Readings from Last 5 Encounters:  ?08/29/21 55.47 kg/m?  ?03/31/21 54.05 kg/m?  ?09/28/20 54.08 kg/m?  ?03/12/20 48.73 kg/m?  ?09/08/19 48.73 kg/m?  ? ? ? ? ? ?Current Outpatient Medications:  ?  amLODipine (NORVASC) 10 MG tablet, Take 1 tablet (10 mg total) by mouth daily., Disp: 90 tablet, Rfl: 3 ?  fesoterodine (TOVIAZ) 4 MG TB24 tablet, Take 1 tablet (4 mg total) by mouth daily., Disp: 90 tablet, Rfl: 3 ?  fluticasone (FLONASE) 50 MCG/ACT nasal spray, Place 2 sprays into both nostrils daily. Can use twice daily for the first 3 days, then return to once daily, Disp: 9.9 mL, Rfl: 1 ?  hydrochlorothiazide (HYDRODIURIL) 25 MG tablet, TAKE 1 TABLET BY MOUTH DAILY, Disp: 90 tablet, Rfl: 0 ?  meloxicam (MOBIC) 15 MG tablet, TAKE 1 TABLET BY  MOUTH DAILY, Disp: 90 tablet, Rfl: 0 ?  metFORMIN (GLUCOPHAGE-XR) 500 MG 24 hr tablet, TAKE 3 TABLETS BY MOUTH DAILY WITH BREAKFAST, Disp: 270 tablet, Rfl: 3 ?  mirabegron ER (MYRBETRIQ) 50 MG TB24 tablet, Take 1 tablet (50 mg total) by mouth daily., Disp: 90 tablet, Rfl: 1 ?  omeprazole (PRILOSEC) 20 MG capsule, Take 20 mg by mouth daily., Disp: , Rfl:  ?  sertraline (ZOLOFT) 100 MG tablet, TAKE 1 TABLET BY MOUTH ONCE DAILY, Disp: 90 tablet, Rfl: 0 ?  busPIRone (BUSPAR) 5 MG tablet, Take 1-2 tablets (5-10 mg total) by mouth 3 (three) times daily as needed (stress/anxiety)., Disp: 180 tablet, Rfl: 2 ?  hydrOXYzine (VISTARIL) 25 MG capsule, Can take 25 mg PO up to TID for anxiety and can take 25-50 mg as needed at bedtime for insomnia, Disp: 60 capsule, Rfl: 1 ? ?Patient Active Problem List  ? Diagnosis Date Noted  ? Type 2 diabetes mellitus, uncontrolled 10/01/2020  ? Situational stress 09/28/2020  ? Cubital tunnel syndrome 11/11/2018  ? Cervical radiculopathy 05/23/2018  ? OSA on CPAP 11/03/2016  ? History of cervical cancer 02/02/2016  ? Hypercoagulable state ([place]) 02/02/2016  ? Abnormal uterine bleeding (AUB) 10/02/2015  ? Obesity, morbid, BMI 50 or higher ([place]) 07/05/2015  ? Malignant neoplasm of upper-outer quadrant of left breast in female, estrogen receptor positive ([place]) 02/22/2015  ? Hyperlipidemia LDL goal <100 02/08/2015  ? GERD without esophagitis 02/03/2015  ? Menorrhagia with irregular cycle 02/03/2015  ? Depressive disorder 02/03/2015  ? Hypertension goal BP (blood pressure) < 140/90 02/03/2015  ? De Quervain's tenosynovitis, bilateral 02/03/2015  ? Numerous moles 02/03/2015  ? Pre-diabetes 02/03/2015  ? History of loop electrosurgical excision procedure (LEEP) of cervix 02/03/2015  ? ? ?Past Surgical History:  ?Procedure Laterality Date  ? ABDOMINAL HYSTERECTOMY  06/15/2017  ? BREAST EXCISIONAL BIOPSY Left 03/18/2015  ? breast ca +  ? BREAST LUMPECTOMY WITH RADIOACTIVE SEED LOCALIZATION Left  03/18/2015  ? Procedure: BREAST LUMPECTOMY WITH RADIOACTIVE SEED LOCALIZATION;  Surgeon: Erroll Luna, MD;  Location: [facility];  Service: General;  Laterality: Left;  ? CARPAL TUNNEL RELEASE Bilateral 04/16/2020  ? CERVICAL BIOPSY  W/ LOOP ELECTRODE EXCISION    ? CHOLECYSTECTOMY    ? HERNIA REPAIR    ? MASTECTOMY, PARTIAL Left 02/15/2015  ? NASAL SEPTUM SURGERY    ? TONSILLECTOMY AND ADENOIDECTOMY    ? UVULECTOMY    ? ? ?Family History  ?Adopted: Yes  ?Problem Relation Age of Onset  ? Breast cancer Neg Hx   ? ? ?Social History  ? ?Tobacco Use  ? Smoking status:  Never  ? Smokeless tobacco: Never  ?Vaping Use  ? Vaping Use: Never used  ?Substance Use Topics  ? Alcohol use: No  ?  Alcohol/week: 0.0 standard drinks  ? Drug use: No  ?  ? ?No Known Allergies ? ?Health Maintenance  ?Topic Date Due  ? Zoster Vaccines- Shingrix (1 of 2) Never done  ? FOOT EXAM  10/03/2018  ? URINE MICROALBUMIN  01/22/2019  ? OPHTHALMOLOGY EXAM  04/08/2020  ? MAMMOGRAM  03/05/2021  ? HEMOGLOBIN A1C  03/31/2021  ? COVID-19 Vaccine (3 - Pfizer risk series) 09/14/2021 (Originally 09/30/2019)  ? INFLUENZA VACCINE  12/13/2021  ? Fecal DNA (Cologuard)  11/03/2023  ? TETANUS/TDAP  09/05/2025  ? Hepatitis C Screening  Completed  ? HIV Screening  Completed  ? HPV VACCINES  Aged Out  ? PAP SMEAR-Modifier  Discontinued  ? ? ?Chart Review Today: ?I personally reviewed active problem list, medication list, allergies, family history, social history, health maintenance, notes from last encounter, lab results, imaging with the patient/caregiver today. ? ? ?Review of Systems  ?Constitutional: Negative.   ?HENT: Negative.    ?Eyes: Negative.   ?Respiratory: Negative.    ?Cardiovascular: Negative.   ?Gastrointestinal: Negative.   ?Endocrine: Negative.   ?Genitourinary: Negative.   ?Musculoskeletal: Negative.   ?Skin: Negative.   ?Allergic/Immunologic: Negative.   ?Neurological: Negative.   ?Hematological: Negative.   ?Psychiatric/Behavioral:  Negative.    ?All other systems reviewed and are negative.  ? ?Objective:  ? ?Vitals:  ? 08/29/21 1111  ?BP: 138/72  ?Pulse: 95  ?Resp: 16  ?Temp: 98.4 ?F (36.9 ?C)  ?TempSrc: Oral  ?SpO2: 94%  ?Weight: (!) 375

## 2021-08-30 LAB — CBC WITH DIFFERENTIAL/PLATELET
Absolute Monocytes: 536 cells/uL (ref 200–950)
Basophils Absolute: 43 cells/uL (ref 0–200)
Basophils Relative: 0.5 %
Eosinophils Absolute: 544 cells/uL — ABNORMAL HIGH (ref 15–500)
Eosinophils Relative: 6.4 %
HCT: 41.2 % (ref 35.0–45.0)
Hemoglobin: 13 g/dL (ref 11.7–15.5)
Lymphs Abs: 2474 cells/uL (ref 850–3900)
MCH: 25.2 pg — ABNORMAL LOW (ref 27.0–33.0)
MCHC: 31.6 g/dL — ABNORMAL LOW (ref 32.0–36.0)
MCV: 80 fL (ref 80.0–100.0)
MPV: 11 fL (ref 7.5–12.5)
Monocytes Relative: 6.3 %
Neutro Abs: 4905 cells/uL (ref 1500–7800)
Neutrophils Relative %: 57.7 %
Platelets: 274 10*3/uL (ref 140–400)
RBC: 5.15 10*6/uL — ABNORMAL HIGH (ref 3.80–5.10)
RDW: 14.6 % (ref 11.0–15.0)
Total Lymphocyte: 29.1 %
WBC: 8.5 10*3/uL (ref 3.8–10.8)

## 2021-08-30 LAB — COMPLETE METABOLIC PANEL WITH GFR
AG Ratio: 1.3 (calc) (ref 1.0–2.5)
ALT: 29 U/L (ref 6–29)
AST: 23 U/L (ref 10–35)
Albumin: 4.4 g/dL (ref 3.6–5.1)
Alkaline phosphatase (APISO): 74 U/L (ref 37–153)
BUN: 17 mg/dL (ref 7–25)
CO2: 25 mmol/L (ref 20–32)
Calcium: 9.8 mg/dL (ref 8.6–10.4)
Chloride: 104 mmol/L (ref 98–110)
Creat: 0.85 mg/dL (ref 0.50–1.03)
Globulin: 3.4 g/dL (calc) (ref 1.9–3.7)
Glucose, Bld: 104 mg/dL — ABNORMAL HIGH (ref 65–99)
Potassium: 4.3 mmol/L (ref 3.5–5.3)
Sodium: 140 mmol/L (ref 135–146)
Total Bilirubin: 0.4 mg/dL (ref 0.2–1.2)
Total Protein: 7.8 g/dL (ref 6.1–8.1)
eGFR: 83 mL/min/{1.73_m2} (ref >=60)

## 2021-08-30 LAB — LIPID PANEL
Cholesterol: 179 mg/dL (ref <200)
HDL: 63 mg/dL (ref >=50)
LDL Cholesterol (Calc): 95 mg/dL (calc)
Non-HDL Cholesterol (Calc): 116 mg/dL (calc) (ref <130)
Total CHOL/HDL Ratio: 2.8 (calc) (ref <5.0)
Triglycerides: 115 mg/dL (ref <150)

## 2021-08-30 LAB — HEMOGLOBIN A1C
Hgb A1c MFr Bld: 7.8 % of total Hgb — ABNORMAL HIGH (ref <5.7)
Mean Plasma Glucose: 177 mg/dL
eAG (mmol/L): 9.8 mmol/L

## 2021-08-30 LAB — MICROALBUMIN, URINE: Microalb, Ur: 4.6 mg/dL

## 2021-09-02 ENCOUNTER — Other Ambulatory Visit: Payer: Self-pay | Admitting: Family Medicine

## 2021-09-02 MED ORDER — ROSUVASTATIN CALCIUM 5 MG PO TABS: 5.0000 mg | ORAL_TABLET | Freq: Every day | ORAL | 3 refills | Status: DC

## 2021-09-02 MED ORDER — METFORMIN HCL ER 500 MG PO TB24: 1000.0000 mg | ORAL_TABLET | Freq: Two times a day (BID) | ORAL | 1 refills | Status: DC

## 2021-09-02 MED ORDER — LISINOPRIL-HYDROCHLOROTHIAZIDE 10-12.5 MG PO TABS: 1.0000 | ORAL_TABLET | Freq: Every day | ORAL | 3 refills | Status: DC

## 2021-09-05 ENCOUNTER — Telehealth: Payer: Self-pay | Admitting: Family Medicine

## 2021-09-05 NOTE — Telephone Encounter (Signed)
Copied from [place] [id number]. Topic: General - Inquiry ?>> Sep 05, 2021  1:55 PM Royal Hawthorn, CMA wrote: ?Reason for CRM: Left voicemail to relay lab results and advice per Delsa Grana, PA. If patient calls back, please relay. ?

## 2021-09-07 NOTE — Telephone Encounter (Signed)
Patient called, left VM to return the call to the office for lab results.  ?

## 2021-10-18 ENCOUNTER — Encounter: Payer: Self-pay | Admitting: Family Medicine

## 2021-10-24 ENCOUNTER — Other Ambulatory Visit: Payer: Self-pay | Admitting: Urology

## 2021-10-24 ENCOUNTER — Other Ambulatory Visit: Payer: Self-pay | Admitting: Physician Assistant

## 2021-10-24 DIAGNOSIS — F320 Major depressive disorder, single episode, mild: Secondary | ICD-10-CM

## 2021-10-25 NOTE — Telephone Encounter (Signed)
Requested Prescriptions  Pending Prescriptions Disp Refills  . sertraline (ZOLOFT) 100 MG tablet [Pharmacy Med Name: SERTRALINE HCL 100 MG TAB] 90 tablet 1    Sig: TAKE 1 TABLET BY MOUTH ONCE DAILY     Psychiatry:  Antidepressants - SSRI - sertraline Passed - 10/24/2021  9:20 AM      Passed - AST in normal range and within 360 days    AST  Date Value Ref Range Status  08/29/2021 23 10 - 35 U/L Final  02/24/2015 29 5 - 34 U/L Final         Passed - ALT in normal range and within 360 days    ALT  Date Value Ref Range Status  08/29/2021 29 6 - 29 U/L Final  02/24/2015 32 0 - 55 U/L Final         Passed - Completed PHQ-2 or PHQ-9 in the last 360 days      Passed - Valid encounter within last 6 months    Recent Outpatient Visits          1 month ago Type 2 diabetes mellitus without complication, without long-term current use of insulin [facility])   [facility] [place], Kristeen Miss, PA-C   1 year ago Hypertension goal BP (blood pressure) < 140/90   [facility] Delsa Grana, PA-C   1 year ago [place], MD   1 year ago Acute non-recurrent maxillary sinusitis   [place], MD   2 years ago Acute bacterial conjunctivitis of left eye   [facility] Towanda Malkin, MD      Future Appointments            In 2 months Delsa Grana, PA-C [facility] [place]   In 5 months [place]

## 2021-10-28 ENCOUNTER — Encounter: Payer: Self-pay | Admitting: Family Medicine

## 2021-11-04 ENCOUNTER — Ambulatory Visit: Payer: Medicaid Other | Admitting: Podiatry

## 2021-12-23 ENCOUNTER — Other Ambulatory Visit: Payer: Self-pay | Admitting: Podiatry

## 2021-12-23 ENCOUNTER — Other Ambulatory Visit: Payer: Self-pay | Admitting: Family Medicine

## 2021-12-23 ENCOUNTER — Other Ambulatory Visit: Payer: Self-pay | Admitting: *Deleted

## 2021-12-23 DIAGNOSIS — N3946 Mixed incontinence: Secondary | ICD-10-CM

## 2021-12-23 MED ORDER — MIRABEGRON ER 50 MG PO TB24: 50.0000 mg | ORAL_TABLET | Freq: Every day | ORAL | 3 refills | Status: DC

## 2021-12-29 ENCOUNTER — Ambulatory Visit: Payer: Medicaid Other | Admitting: Family Medicine

## 2021-12-29 DIAGNOSIS — E119 Type 2 diabetes mellitus without complications: Secondary | ICD-10-CM

## 2021-12-29 DIAGNOSIS — I10 Essential (primary) hypertension: Secondary | ICD-10-CM

## 2021-12-29 DIAGNOSIS — E785 Hyperlipidemia, unspecified: Secondary | ICD-10-CM

## 2022-03-29 ENCOUNTER — Other Ambulatory Visit: Payer: Self-pay | Admitting: Urology

## 2022-03-29 ENCOUNTER — Other Ambulatory Visit: Payer: Self-pay | Admitting: Family Medicine

## 2022-03-29 DIAGNOSIS — R3915 Urgency of urination: Secondary | ICD-10-CM

## 2022-03-29 DIAGNOSIS — N3946 Mixed incontinence: Secondary | ICD-10-CM

## 2022-03-29 DIAGNOSIS — R351 Nocturia: Secondary | ICD-10-CM

## 2022-04-11 NOTE — Progress Notes (Deleted)
04/12/2022 9:31 PM   Elizabeth Hoffman Dec 21, 1970 [id number]  Referring provider: Delsa Grana, PA-C [address] [place],  [place] [address]  Urological history: 1. OAB -contributing factors of diabetes, depression, vaginal atrophy, pelvic surgery, antihistamines, sleep apnea, decongestants and diuretics  -cysto (2019) - NED -PVR *** mL -managed with Myrbetriq 50 mg daily and Toviaz 4 mg daily  2. Incontinence -contributing factors of obesity, diabetes, depression, vaginal atrophy, pelvic surgery, antihistamines, decongestants and diuretics  -PVR ***  3. Nocturia -contributing factors of obstructive sleep apnea, hypertension, diabetes, anxiety and depression -on CPAP ***  No chief complaint on file.    HPI: Elizabeth Hoffman is a 51 y.o. female who presents today for yearly follow up.  UA ***  PVR ***  PMH: Past Medical History:  Diagnosis Date   Anxiety    Arthritis    Breast cancer ([facility]) 2016   Depression    Hot flashes    Hypertension    OSA on CPAP 11/03/2016   Personal history of radiation therapy     Surgical History: Past Surgical History:  Procedure Laterality Date   ABDOMINAL HYSTERECTOMY  06/15/2017   BREAST EXCISIONAL BIOPSY Left 03/18/2015   breast ca +   BREAST LUMPECTOMY WITH RADIOACTIVE SEED LOCALIZATION Left 03/18/2015   Procedure: BREAST LUMPECTOMY WITH RADIOACTIVE SEED LOCALIZATION;  Surgeon: Erroll Luna, MD;  Location: [facility];  Service: General;  Laterality: Left;   CARPAL TUNNEL RELEASE Bilateral 04/16/2020   CERVICAL BIOPSY  W/ LOOP ELECTRODE EXCISION     CHOLECYSTECTOMY     HERNIA REPAIR     MASTECTOMY, PARTIAL Left 02/15/2015   NASAL SEPTUM SURGERY     TONSILLECTOMY AND ADENOIDECTOMY     UVULECTOMY      Home Medications:  Allergies as of 04/12/2022   No Known Allergies      Medication List        Accurate as of April 11, 2022  9:31 PM. If you have any questions, ask your nurse or  doctor.          fluticasone 50 MCG/ACT nasal spray Commonly known as: FLONASE Place 2 sprays into both nostrils daily. Can use twice daily for the first 3 days, then return to once daily   lisinopril-hydrochlorothiazide 10-12.5 MG tablet Commonly known as: ZESTORETIC Take 1 tablet by mouth daily.   meloxicam 15 MG tablet Commonly known as: MOBIC TAKE 1 TABLET BY MOUTH DAILY   metFORMIN 500 MG 24 hr tablet Commonly known as: GLUCOPHAGE-XR TAKE 3 TABLETS BY MOUTH DAILY WITH BREAKFAST   mirabegron ER 50 MG Tb24 tablet Commonly known as: MYRBETRIQ Take 1 tablet (50 mg total) by mouth daily.   omeprazole 20 MG capsule Commonly known as: PRILOSEC Take 20 mg by mouth daily.   rosuvastatin 5 MG tablet Commonly known as: Crestor Take 1 tablet (5 mg total) by mouth at bedtime.   sertraline 100 MG tablet Commonly known as: ZOLOFT TAKE 1 TABLET BY MOUTH ONCE DAILY   Toviaz 4 MG Tb24 tablet Generic drug: fesoterodine TAKE 1 TABLET BY MOUTH DAILY        Allergies: No Known Allergies  Family History: Family History  Adopted: Yes  Problem Relation Age of Onset   Breast cancer Neg Hx     Social History:  reports that she has never smoked. She has never used smokeless tobacco. She reports that she does not drink alcohol and does not use drugs.  ROS: For pertinent  review of systems please refer to history of present illness  Physical Exam: LMP 02/18/2015 (Approximate)   Constitutional:  Well nourished. Alert and oriented, No acute distress. HEENT: [place] AT, moist mucus membranes.  Trachea midline, no masses. Cardiovascular: No clubbing, cyanosis, or edema. Respiratory: Normal respiratory effort, no increased work of breathing. GU: No CVA tenderness.  No bladder fullness or masses. Vulvovaginal atrophy w/ pallor, loss of rugae, introital retraction, excoriations.  Vulvar thinning, fusion of labia, clitoral hood retraction, prominent urethral meatus.   *** external  genitalia, *** pubic hair distribution, no lesions.  Normal urethral meatus, no lesions, no prolapse, no discharge.   No urethral masses, tenderness and/or tenderness. No bladder fullness, tenderness or masses. *** vagina mucosa, *** estrogen effect, no discharge, no lesions, *** pelvic support, *** cystocele and *** rectocele noted.  No cervical motion tenderness.  Uterus is freely mobile and non-fixed.  No adnexal/parametria masses or tenderness noted.  Anus and perineum are without rashes or lesions.   ***  Neurologic: Grossly intact, no focal deficits, moving all 4 extremities. Psychiatric: Normal mood and affect.    Laboratory Data: Hemoglobin A1c (08/2021) 7.8% Serum creatinine (08/2021) 0.85  Urinalysis *** I have reviewed the labs.      Pertinent Imaging: ***    Assessment & Plan:    1. Mixed incontinence -She states that her symptoms are manageable and taking Myrbetriq 25 mg daily and Toviaz 4 mg daily -She still experiences significant leakage, so we will have a trial of Myrbetriq 50 mg daily to see if her leaking episodes decrease -Myrbetriq 50 mg, #28 samples given-she will contact us after 3 weeks of samples regarding whether she wants her Myrbetriq increased to 50 mg daily or to remain on the Myrbetriq 25 mg daily  2. Nocturia -continue CPAP machine  3. Urgency -see # 1  No follow-ups on file.  These notes generated with voice recognition software. I apologize for typographical errors.  [place], [place] [address]  [place], [place] [address] [phone]

## 2022-04-12 ENCOUNTER — Ambulatory Visit: Payer: Medicaid Other | Admitting: Urology

## 2022-04-12 DIAGNOSIS — R3915 Urgency of urination: Secondary | ICD-10-CM

## 2022-04-12 DIAGNOSIS — N3946 Mixed incontinence: Secondary | ICD-10-CM

## 2022-04-12 DIAGNOSIS — R351 Nocturia: Secondary | ICD-10-CM

## 2022-04-13 NOTE — Progress Notes (Signed)
04/14/2022 9:19 PM   Elizabeth Hoffman 06-02-1970 [id number]  Referring provider: Delsa Grana, PA-C [address] [place],  [place] [address]  Urological history: 1. OAB -contributing factors of diabetes, depression, vaginal atrophy, pelvic surgery, antihistamines, sleep apnea, decongestants and diuretics  -cysto (2019) - NED -PVR 10 mL -managed with Myrbetriq 50 mg daily and Toviaz 4 mg daily  2. Incontinence -contributing factors of obesity, diabetes, depression, vaginal atrophy, pelvic surgery, antihistamines, decongestants and diuretics  -PVR 10 mL  3. Nocturia -contributing factors of obstructive sleep apnea, hypertension, diabetes, anxiety and depression -not on CPAP   Chief Complaint  Patient presents with   Follow-up     HPI: Elizabeth Hoffman is a 51 y.o. female who presents today for yearly follow up.  She has been without her OAB medications for the last 6 weeks due to insurance issues.  She is having 8 or more daytime urinations, 3 or more nighttime urinations with a severe urge to urinate.  She has urge incontinence 3 or more times daily and goes through 5 absorbent pads daily.  She is limiting her fluid intake and does engage in toilet mapping.  Patient denies any modifying or aggravating factors.  Patient denies any gross hematuria, dysuria or suprapubic/flank pain.  Patient denies any fevers, chills, nausea or vomiting.    UA yellow cloudy, specific gravity greater than 1.030, pH 5.0, 2+ protein, 0-5 WBCs, 0-2 RBCs, greater than 10 epithelial cells and moderate bacteria.  PVR 10 mL   PMH: Past Medical History:  Diagnosis Date   Anxiety    Arthritis    Breast cancer ([facility]) 2016   Depression    Hot flashes    Hypertension    OSA on CPAP 11/03/2016   Personal history of radiation therapy     Surgical History: Past Surgical History:  Procedure Laterality Date   ABDOMINAL HYSTERECTOMY  06/15/2017   BREAST EXCISIONAL BIOPSY Left  03/18/2015   breast ca +   BREAST LUMPECTOMY WITH RADIOACTIVE SEED LOCALIZATION Left 03/18/2015   Procedure: BREAST LUMPECTOMY WITH RADIOACTIVE SEED LOCALIZATION;  Surgeon: Erroll Luna, MD;  Location: [facility];  Service: General;  Laterality: Left;   CARPAL TUNNEL RELEASE Bilateral 04/16/2020   CERVICAL BIOPSY  W/ LOOP ELECTRODE EXCISION     CHOLECYSTECTOMY     HERNIA REPAIR     MASTECTOMY, PARTIAL Left 02/15/2015   NASAL SEPTUM SURGERY     TONSILLECTOMY AND ADENOIDECTOMY     UVULECTOMY      Home Medications:  Allergies as of 04/14/2022   No Known Allergies      Medication List        Accurate as of April 14, 2022 11:59 PM. If you have any questions, ask your nurse or doctor.          fesoterodine 4 MG Tb24 tablet Commonly known as: Toviaz Take 1 tablet (4 mg total) by mouth daily.   fluticasone 50 MCG/ACT nasal spray Commonly known as: FLONASE Place 2 sprays into both nostrils daily. Can use twice daily for the first 3 days, then return to once daily   lisinopril-hydrochlorothiazide 10-12.5 MG tablet Commonly known as: ZESTORETIC Take 1 tablet by mouth daily.   meloxicam 15 MG tablet Commonly known as: MOBIC TAKE 1 TABLET BY MOUTH DAILY   metFORMIN 500 MG 24 hr tablet Commonly known as: GLUCOPHAGE-XR TAKE 3 TABLETS BY MOUTH DAILY WITH BREAKFAST   mirabegron ER 50 MG Tb24 tablet Commonly known as:  MYRBETRIQ Take 1 tablet (50 mg total) by mouth daily.   omeprazole 20 MG capsule Commonly known as: PRILOSEC Take 20 mg by mouth daily.   rosuvastatin 5 MG tablet Commonly known as: Crestor Take 1 tablet (5 mg total) by mouth at bedtime.   sertraline 100 MG tablet Commonly known as: ZOLOFT TAKE 1 TABLET BY MOUTH ONCE DAILY        Allergies: No Known Allergies  Family History: Family History  Adopted: Yes  Problem Relation Age of Onset   Breast cancer Neg Hx     Social History:  reports that she has never smoked. She has never  used smokeless tobacco. She reports that she does not drink alcohol and does not use drugs.  ROS: For pertinent review of systems please refer to history of present illness  Physical Exam: BP (!) 159/96   Pulse 74   Ht '5\' 9"'$  (1.753 m)   Wt (!) 365 lb (165.6 kg)   LMP 02/18/2015 (Approximate)   BMI 53.90 kg/m   Constitutional:  Well nourished. Alert and oriented, No acute distress. HEENT: [place] AT, moist mucus membranes.  Trachea midline Cardiovascular: No clubbing, cyanosis, or edema. Respiratory: Normal respiratory effort, no increased work of breathing. Neurologic: Grossly intact, no focal deficits, moving all 4 extremities. Psychiatric: Normal mood and affect.    Laboratory Data: Hemoglobin A1c (08/2021) 7.8% Serum creatinine (08/2021) 0.85  Urinalysis Results for orders placed or performed in visit on 04/14/22  Microscopic Examination   Urine  Result Value Ref Range   WBC, UA 0-5 0 - 5 /hpf   RBC, Urine 0-2 0 - 2 /hpf   Epithelial Cells (non renal) >10 (A) 0 - 10 /hpf   Bacteria, UA Moderate (A) None seen/Few  Urinalysis, Complete  Result Value Ref Range   Specific Gravity, UA 1.030 1.005 - 1.030   pH, UA 5.0 5.0 - 7.5   Color, UA Yellow Yellow   Appearance Ur Hazy (A) Clear   Leukocytes,UA Negative Negative   Protein,UA 2+ (A) Negative/Trace   Glucose, UA Negative Negative   Ketones, UA Negative Negative   RBC, UA Negative Negative   Bilirubin, UA Negative Negative   Urobilinogen, Ur 0.2 0.2 - 1.0 mg/dL   Nitrite, UA Negative Negative   Microscopic Examination See below:   Bladder Scan (Post Void Residual) in office  Result Value Ref Range   Scan Result 10   I have reviewed the labs.      Pertinent Imaging:  04/14/2022  Scan Result 10    Assessment & Plan:    1. Mixed incontinence -She has been without her medication for 6 weeks and her urinary symptoms are significantly worsened -Refilled her Toviaz 4 mg daily and Myrbetriq 50 mg daily  2.  Nocturia -continue CPAP machine  3. Urgency -see # 1  Return in about 6 weeks (around 05/26/2022) for 6 week follow up oab .  These notes generated with voice recognition software. I apologize for typographical errors.  [facility], [place] [address]  [place], [place] [address] [phone]

## 2022-04-14 ENCOUNTER — Ambulatory Visit (INDEPENDENT_AMBULATORY_CARE_PROVIDER_SITE_OTHER): Payer: Medicaid Other | Admitting: Urology

## 2022-04-14 ENCOUNTER — Other Ambulatory Visit: Payer: Self-pay | Admitting: *Deleted

## 2022-04-14 ENCOUNTER — Other Ambulatory Visit: Payer: Self-pay | Admitting: Family Medicine

## 2022-04-14 VITALS — BP 159/96 | HR 74 | Ht 69.0 in | Wt 365.0 lb

## 2022-04-14 DIAGNOSIS — R351 Nocturia: Secondary | ICD-10-CM

## 2022-04-14 DIAGNOSIS — N3946 Mixed incontinence: Secondary | ICD-10-CM | POA: Diagnosis not present

## 2022-04-14 DIAGNOSIS — R3915 Urgency of urination: Secondary | ICD-10-CM | POA: Diagnosis not present

## 2022-04-14 DIAGNOSIS — Z419 Encounter for procedure for purposes other than remedying health state, unspecified: Secondary | ICD-10-CM | POA: Diagnosis not present

## 2022-04-14 LAB — URINALYSIS, COMPLETE
Bilirubin, UA: NEGATIVE
Glucose, UA: NEGATIVE
Ketones, UA: NEGATIVE
Leukocytes,UA: NEGATIVE
Nitrite, UA: NEGATIVE
RBC, UA: NEGATIVE
Specific Gravity, UA: 1.03 (ref 1.005–1.030)
Urobilinogen, Ur: 0.2 mg/dL (ref 0.2–1.0)
pH, UA: 5 (ref 5.0–7.5)

## 2022-04-14 LAB — MICROSCOPIC EXAMINATION: Epithelial Cells (non renal): >10 /hpf — AB (ref 0–10)

## 2022-04-14 LAB — BLADDER SCAN AMB NON-IMAGING: Scan Result: 10

## 2022-04-14 MED ORDER — MIRABEGRON ER 50 MG PO TB24: 50.0000 mg | ORAL_TABLET | Freq: Every day | ORAL | 3 refills | Status: DC

## 2022-04-14 MED ORDER — FESOTERODINE FUMARATE ER 4 MG PO TB24: 4.0000 mg | ORAL_TABLET | Freq: Every day | ORAL | 0 refills | Status: DC

## 2022-04-14 NOTE — Telephone Encounter (Signed)
From: [name] To: Office of [place], [place] Sent: 04/14/2022 11:27 AM EST Subject: Medication Renewal Request  Refills have been requested for the following medications:   fesoterodine (TOVIAZ) 4 MG TB24 tablet [SHANNON MCGOWAN]  Preferred pharmacy: [facility], [place] Delivery method: Pickup   Medication renewals requested in this message routed separately:   metFORMIN (GLUCOPHAGE-XR) 500 MG 24 hr tablet [Leisa Tapia]

## 2022-04-16 ENCOUNTER — Encounter: Payer: Self-pay | Admitting: Urology

## 2022-04-20 ENCOUNTER — Other Ambulatory Visit: Payer: Self-pay | Admitting: Family Medicine

## 2022-04-20 NOTE — Telephone Encounter (Signed)
Lvm to return call to sch appt for additional refills

## 2022-04-20 NOTE — Telephone Encounter (Signed)
Pt needs an appt

## 2022-04-21 ENCOUNTER — Ambulatory Visit: Payer: Medicaid Other | Admitting: Podiatry

## 2022-04-21 ENCOUNTER — Encounter: Payer: Self-pay | Admitting: Podiatry

## 2022-04-21 VITALS — BP 130/65 | HR 73

## 2022-04-21 DIAGNOSIS — M778 Other enthesopathies, not elsewhere classified: Secondary | ICD-10-CM | POA: Diagnosis not present

## 2022-04-21 MED ORDER — MELOXICAM 15 MG PO TABS: 15.0000 mg | ORAL_TABLET | Freq: Every day | ORAL | 1 refills | Status: DC

## 2022-04-21 MED ORDER — METHYLPREDNISOLONE 4 MG PO TBPK: ORAL_TABLET | ORAL | 0 refills | Status: DC

## 2022-04-21 MED ORDER — BETAMETHASONE SOD PHOS & ACET 6 (3-3) MG/ML IJ SUSP: 3.0000 mg | Freq: Once | INTRAMUSCULAR | Status: DC

## 2022-04-21 NOTE — Progress Notes (Signed)
   Chief Complaint  Patient presents with   injection left foot    Patient is here for pain in left foot she is wanting injection .    HPI: 51 year old female presenting today for follow up evaluation of throbbing left foot pain.  Patient states that the injection did help for quite a few months.  She states that slowly the pain has returned.  Patient states that recently she ran out of meloxicam.  She says that the meloxicam does help significantly  Past Medical History:  Diagnosis Date   Anxiety    Arthritis    Breast cancer ([facility]) 2016   Depression    Hot flashes    Hypertension    OSA on CPAP 11/03/2016   Personal history of radiation therapy      Physical Exam: General: The patient is alert and oriented x3 in no acute distress.  Dermatology: Skin is warm, dry and supple bilateral lower extremities. Negative for open lesions or macerations.  There is some hyperkeratotic preulcerative callus tissue to the lateral aspect of the fifth MTPJ left foot.  Associated tenderness to palpation  Vascular: Palpable pedal pulses bilaterally. No edema or erythema noted. Capillary refill within normal limits.  Neurological: Epicritic and protective threshold grossly intact bilaterally.   Musculoskeletal Exam: No pedal deformity.  There is some tenderness with palpation along the fourth and fifth MTP of the left foot.  Tenderness with range of motion as well.  Assessment: 1.  Fourth and fifth MTP capsulitis left   Plan of Care:  1. Patient evaluated.  2.  Injection of 0.5 cc Celestone Soluspan injected along the fourth and fifth MTP left 3.  Prescription for Medrol Dosepak 4.  Prescription for meloxicam 15 mg daily 5.  Recommend good supportive shoes and sneakers that the provide arch support and offload pressure from the forefoot 6.  Return to clinic as needed  Daycare teacher standing all day.     Edrick Kins, DPM [facility]  Dr. Edrick Kins, DPM    [address]  [place], [place] [address]                Office [phone]  Fax [phone]

## 2022-04-27 ENCOUNTER — Encounter: Payer: Self-pay | Admitting: Family Medicine

## 2022-05-05 ENCOUNTER — Ambulatory Visit: Payer: Medicaid Other | Admitting: Family Medicine

## 2022-05-10 NOTE — Progress Notes (Signed)
Name: Elizabeth Hoffman   MRN: [id number]    DOB: [birth year]   Date:05/11/2022       Progress Note  Chief Complaint  Patient presents with   Medication Refill     Subjective:   Elizabeth Hoffman is a 51 y.o. female, presents to clinic for f/up on chronic conditions  Last OV was advised to come for f/up in 3-4 months with multiple new meds - she lost insurance and then got new insurance recently so she has come in for f/up  DM:  uncontrolled, lost to f/up due to insurance Pt managing DM with metformin 1000 mg BID - meds changed April 2023 due to higher A1C Reports good med compliance Pt has no  SE from meds. Denies: Polyuria, polydipsia, vision changes, neuropathy, hypoglycemia Recent pertinent labs: Lab Results  Component Value Date   HGBA1C 7.8 (H) 08/29/2021   HGBA1C 7.2 (H) 09/28/2020   HGBA1C 6.3 (H) 01/21/2018   Lab Results  Component Value Date   MICROALBUR 4.6 08/29/2021   Selz 95 08/29/2021   CREATININE 0.85 08/29/2021   Standard of care and health maintenance: Urine Microalbumin:  due Foot exam:  UTD DM eye exam:  due ACEI/ARB:  discussed today Statin:  on crestor  HLD - given crestor 5 mg daily to start Lab Results  Component Value Date   CHOL 179 08/29/2021   HDL 63 08/29/2021   LDLCALC 95 08/29/2021   TRIG 115 08/29/2021   CHOLHDL 2.8 08/29/2021   HTN: multiple med changes due to new onset DM dx Last Rx lisinopril hctz 10-12.5, previously on norvasc 10 and HCTZ 25  MDD was on zoloft 100 mg - mood and circumstances getting better    05/11/2022   11:07 AM 08/29/2021   11:07 AM 09/28/2020    9:46 AM  Depression screen PHQ 2/9  Decreased Interest 0 0 1  Down, Depressed, Hopeless 0 0 1  PHQ - 2 Score 0 0 2  Altered sleeping 0 0 3  Tired, decreased energy 0 0 2  Change in appetite 0 0 1  Feeling bad or failure about yourself  0 0 1  Trouble concentrating 0 0 1  Moving slowly or fidgety/restless 0 0 0  Suicidal thoughts 0 0 0  PHQ-9 Score  0 0 10  Difficult doing work/chores  Not difficult at all Somewhat difficult   OAB - goes to urology- worse urinary sx when unable to get meds      Current Outpatient Medications:    fesoterodine (TOVIAZ) 4 MG TB24 tablet, Take 1 tablet (4 mg total) by mouth daily., Disp: 30 tablet, Rfl: 0   fluticasone (FLONASE) 50 MCG/ACT nasal spray, Place 2 sprays into both nostrils daily. Can use twice daily for the first 3 days, then return to once daily, Disp: 9.9 mL, Rfl: 1   lisinopril-hydrochlorothiazide (ZESTORETIC) 10-12.5 MG tablet, TAKE 1 TABLET BY MOUTH DAILY., Disp: 30 tablet, Rfl: 0   meloxicam (MOBIC) 15 MG tablet, Take 1 tablet (15 mg total) by mouth daily., Disp: 90 tablet, Rfl: 1   metFORMIN (GLUCOPHAGE-XR) 500 MG 24 hr tablet, TAKE 3 TABLETS BY MOUTH DAILY WITH BREAKFAST, Disp: 270 tablet, Rfl: 0   mirabegron ER (MYRBETRIQ) 50 MG TB24 tablet, Take 1 tablet (50 mg total) by mouth daily., Disp: 90 tablet, Rfl: 3   omeprazole (PRILOSEC) 20 MG capsule, Take 20 mg by mouth daily., Disp: , Rfl:    sertraline (ZOLOFT) 100 MG tablet, TAKE 1 TABLET BY  MOUTH ONCE DAILY, Disp: 90 tablet, Rfl: 1   rosuvastatin (CRESTOR) 5 MG tablet, Take 1 tablet (5 mg total) by mouth daily., Disp: 90 tablet, Rfl: 3  Current Facility-Administered Medications:    betamethasone acetate-betamethasone sodium phosphate (CELESTONE) injection 3 mg, 3 mg, Intra-articular, Once, Edrick Kins, DPM  Patient Active Problem List   Diagnosis Date Noted   Uncontrolled type 2 diabetes mellitus with hyperglycemia, without long-term current use of insulin ([place]) 10/01/2020   Situational stress 09/28/2020   Cubital tunnel syndrome 11/11/2018   Cervical radiculopathy 05/23/2018   OSA on CPAP 11/03/2016   History of cervical cancer 02/02/2016   Hypercoagulable state ([place]) 02/02/2016   Abnormal uterine bleeding (AUB) 10/02/2015   Obesity, morbid, BMI 50 or higher ([place]) 07/05/2015   Malignant neoplasm of upper-outer quadrant of  left breast in female, estrogen receptor positive ([place]) 02/22/2015   Mixed hyperlipidemia 02/08/2015   GERD without esophagitis 02/03/2015   Primary hypertension 02/03/2015   De Quervain's tenosynovitis, bilateral 02/03/2015   Numerous moles 02/03/2015   History of loop electrosurgical excision procedure (LEEP) of cervix 02/03/2015    Past Surgical History:  Procedure Laterality Date   ABDOMINAL HYSTERECTOMY  06/15/2017   BREAST EXCISIONAL BIOPSY Left 03/18/2015   breast ca +   BREAST LUMPECTOMY WITH RADIOACTIVE SEED LOCALIZATION Left 03/18/2015   Procedure: BREAST LUMPECTOMY WITH RADIOACTIVE SEED LOCALIZATION;  Surgeon: Erroll Luna, MD;  Location: [facility];  Service: General;  Laterality: Left;   CARPAL TUNNEL RELEASE Bilateral 04/16/2020   CERVICAL BIOPSY  W/ LOOP ELECTRODE EXCISION     CHOLECYSTECTOMY     HERNIA REPAIR     MASTECTOMY, PARTIAL Left 02/15/2015   NASAL SEPTUM SURGERY     TONSILLECTOMY AND ADENOIDECTOMY     UVULECTOMY      Family History  Adopted: Yes  Problem Relation Age of Onset   Breast cancer Neg Hx     Social History   Tobacco Use   Smoking status: Never   Smokeless tobacco: Never  Vaping Use   Vaping Use: Never used  Substance Use Topics   Alcohol use: No    Alcohol/week: 0.0 standard drinks of alcohol   Drug use: No     No Known Allergies  Health Maintenance  Topic Date Due   Zoster Vaccines- Shingrix (1 of 2) Never done   Diabetic kidney evaluation - Urine ACR  01/22/2019   COVID-19 Vaccine (3 - Pfizer risk series) 09/30/2019   OPHTHALMOLOGY EXAM  04/08/2020   MAMMOGRAM  03/05/2021   INFLUENZA VACCINE  12/13/2021   HEMOGLOBIN A1C  02/28/2022   Diabetic kidney evaluation - eGFR measurement  08/30/2022   FOOT EXAM  08/30/2022   Fecal DNA (Cologuard)  11/03/2023   DTaP/Tdap/Td (2 - Td or Tdap) 09/05/2025   Hepatitis C Screening  Completed   HIV Screening  Completed   HPV VACCINES  Aged Out   PAP SMEAR-Modifier   Discontinued    Chart Review Today: I have reviewed the patient's medical history in detail and updated the computerized patient record.   Review of Systems  Constitutional: Negative.   HENT: Negative.    Eyes: Negative.   Respiratory: Negative.    Cardiovascular: Negative.   Gastrointestinal: Negative.   Endocrine: Negative.   Genitourinary: Negative.   Musculoskeletal: Negative.   Skin: Negative.   Allergic/Immunologic: Negative.   Neurological: Negative.   Hematological: Negative.   Psychiatric/Behavioral: Negative.    All other systems reviewed and are negative.  Objective:   Vitals:   05/11/22 1108 05/11/22 1145  BP: 128/78   Pulse: (!) 101 80  Resp: 16   SpO2: 98%   Weight: (!) 365 lb (165.6 kg)   Height: _0  (1.753 m)     Body mass index is 53.9 kg/m.  Physical Exam Vitals and nursing note reviewed.  Constitutional:      General: She is not in acute distress.    Appearance: Normal appearance. She is well-developed. She is obese. She is not ill-appearing, toxic-appearing or diaphoretic.  HENT:     Head: Normocephalic and atraumatic.     Nose: Nose normal.  Eyes:     General:        Right eye: No discharge.        Left eye: No discharge.     Conjunctiva/sclera: Conjunctivae normal.  Neck:     Trachea: No tracheal deviation.  Cardiovascular:     Rate and Rhythm: Normal rate and regular rhythm.  Pulmonary:     Effort: Pulmonary effort is normal. No respiratory distress.     Breath sounds: No stridor.  Musculoskeletal:        General: Normal range of motion.  Skin:    General: Skin is warm and dry.     Findings: No rash.  Neurological:     Mental Status: She is alert.     Motor: No abnormal muscle tone.     Coordination: Coordination normal.  Psychiatric:        Behavior: Behavior normal.         Assessment & Plan:   Problem List Items Addressed This Visit       Cardiovascular and Mediastinum   Primary hypertension    Blood  pressure at goal today BP Readings from Last 3 Encounters:  05/11/22 128/78  04/21/22 130/65  04/14/22 (!) 159/96  On lisinopril HCTZ 10-12.5 Good compliance, no side effects or concerns, continue medication, labs due today       Relevant Medications   rosuvastatin (CRESTOR) 5 MG tablet   Other Relevant Orders   COMPLETE METABOLIC PANEL WITH GFR (Completed)     Respiratory   OSA on CPAP   Relevant Orders   CBC with Differential/Platelet (Completed)     Digestive   GERD without esophagitis    Symptoms stable Can continue omeprazole over-the-counter as needed did encourage her to use it for few days or week or 2 at a time when she has severe persistent symptoms and needs it preventatively she also was encouraged to take breaks from it and can use Pepcid over-the-counter Encouraged to continue diet and lifestyle efforts      Relevant Orders   CBC with Differential/Platelet (Completed)   COMPLETE METABOLIC PANEL WITH GFR (Completed)     Endocrine   Uncontrolled type 2 diabetes mellitus with hyperglycemia, without long-term current use of insulin ([place]) - Primary    Lost to follow-up after last office visit in April, she lost insurance but she was able to continue her metformin she has been doing maximum dose, due for labs and follow-up today Now that she has insurance she was encouraged to do a diabetic eye exam Labs per orders see below Extensive discussion today about GLP-1's and other medications which she could use to help get diabetes controlled she has no history or family history of thyroid cancer or M EN 2 syndrome, no personal history of pancreatitis Reviewed her insurance coverage it does look like they will cover Ozempic if  she has failed to control disease with metformin or other medications but we would likely have to do a prior authorization      Relevant Medications   rosuvastatin (CRESTOR) 5 MG tablet   Other Relevant Orders   COMPLETE METABOLIC PANEL WITH GFR  (Completed)   Hemoglobin A1c (Completed)   Lipid panel (Completed)   Microalbumin / creatinine urine ratio (Completed)     Genitourinary   OAB (overactive bladder)    Per urology patient on multiple medications, reviewed last office visit and her current meds        Other   Mixed hyperlipidemia   Relevant Medications   rosuvastatin (CRESTOR) 5 MG tablet   Other Relevant Orders   COMPLETE METABOLIC PANEL WITH GFR (Completed)   Lipid panel (Completed)   Malignant neoplasm of upper-outer quadrant of left breast in female, estrogen receptor positive ([place])    Was previously monitored by oncology Mammogram was ordered in April patient was encouraged to call and schedule a follow-up mammogram which is due      Obesity, morbid, BMI 50 or higher ([place])    discussion on ozempic as option, no containdications, educated on diet/lifestyle efforts, can do consult with nutritionist  Multiple associated comorbidities including uncontrolled diabetes, hypertension, hyperlipidemia      Relevant Orders   CBC with Differential/Platelet (Completed)   COMPLETE METABOLIC PANEL WITH GFR (Completed)   Hemoglobin A1c (Completed)   Lipid panel (Completed)   Hypercoagulable state ([place])    Do not have full prior workup or specific diagnosis, will monitor CBCs Patient without any symptoms concerning for recurrent blood clots or bleeding      Situational stress    Has improved over time Her husband will get out of prison in about 6 months they do visit regularly She has continued the medications and stress anxiety and mood have improved PHQ-9 was reviewed and mood is significantly improved she will stay on Zoloft Unfortunately GAD-7 was not repeated today    05/11/2022   11:07 AM 08/29/2021   11:07 AM 09/28/2020    9:46 AM  Depression screen PHQ 2/9  Decreased Interest 0 0 1  Down, Depressed, Hopeless 0 0 1  PHQ - 2 Score 0 0 2  Altered sleeping 0 0 3  Tired, decreased energy 0 0 2  Change in  appetite 0 0 1  Feeling bad or failure about yourself  0 0 1  Trouble concentrating 0 0 1  Moving slowly or fidgety/restless 0 0 0  Suicidal thoughts 0 0 0  PHQ-9 Score 0 0 10  Difficult doing work/chores  Not difficult at all Somewhat difficult      09/28/2020    9:52 AM  GAD 7 : Generalized Anxiety Score  Nervous, Anxious, on Edge 1  Control/stop worrying 3  Worry too much - different things 3  Trouble relaxing 1  Restless 1  Easily annoyed or irritable 2  Afraid - awful might happen 2  Total GAD 7 Score 13  Anxiety Difficulty Somewhat difficult          Current mild episode of major depressive disorder, unspecified whether recurrent ([place])    See above PHQ-9 reviewed today negative and patient will continue on same medications without dose adjustments at this time      Other Visit Diagnoses     Encounter for medication monitoring       Relevant Orders   CBC with Differential/Platelet (Completed)   COMPLETE METABOLIC PANEL WITH GFR (Completed)  Hemoglobin A1c (Completed)   Lipid panel (Completed)   Microalbumin / creatinine urine ratio (Completed)   Need for influenza vaccination       Declined today   Need for pneumococcal vaccination       Patient declined at this time but was strongly encouraged to get the pneumococcal shot with her diabetes   Need for shingles vaccine       Encourage patient to complete, reviewed with her that we can send to her pharmacy   Tachycardia       rechecked manually personally by me at time of exam and was in normal range        Patient with multiple uncontrolled chronic conditions who was lost to follow-up due to loss of insurance she has been off multiple medications and was unable to afford office visit for the previously recommended follow-up. Office visit today took more than 40 minutes with the patient in the exam room and additional time before and after to review the chart extensively, arrange new medications new referrals  and follow-up visits, and to complete medical documentation.   Return in about 3 months (around 08/10/2022) for Routine follow-up.   Delsa Grana, PA-C 05/11/22 11:29 AM

## 2022-05-11 ENCOUNTER — Ambulatory Visit: Payer: Medicaid Other | Admitting: Family Medicine

## 2022-05-11 ENCOUNTER — Encounter: Payer: Self-pay | Admitting: Family Medicine

## 2022-05-11 VITALS — BP 128/78 | HR 80 | Resp 16 | Ht 69.0 in | Wt 365.0 lb

## 2022-05-11 DIAGNOSIS — G4733 Obstructive sleep apnea (adult) (pediatric): Secondary | ICD-10-CM | POA: Diagnosis not present

## 2022-05-11 DIAGNOSIS — K219 Gastro-esophageal reflux disease without esophagitis: Secondary | ICD-10-CM | POA: Diagnosis not present

## 2022-05-11 DIAGNOSIS — Z5181 Encounter for therapeutic drug level monitoring: Secondary | ICD-10-CM | POA: Diagnosis not present

## 2022-05-11 DIAGNOSIS — N3281 Overactive bladder: Secondary | ICD-10-CM

## 2022-05-11 DIAGNOSIS — E1165 Type 2 diabetes mellitus with hyperglycemia: Secondary | ICD-10-CM | POA: Diagnosis not present

## 2022-05-11 DIAGNOSIS — D6859 Other primary thrombophilia: Secondary | ICD-10-CM | POA: Diagnosis not present

## 2022-05-11 DIAGNOSIS — Z23 Encounter for immunization: Secondary | ICD-10-CM

## 2022-05-11 DIAGNOSIS — E782 Mixed hyperlipidemia: Secondary | ICD-10-CM

## 2022-05-11 DIAGNOSIS — I10 Essential (primary) hypertension: Secondary | ICD-10-CM

## 2022-05-11 DIAGNOSIS — Z170 Estrogen receptor positive status [ER+]: Secondary | ICD-10-CM

## 2022-05-11 DIAGNOSIS — R000 Tachycardia, unspecified: Secondary | ICD-10-CM

## 2022-05-11 DIAGNOSIS — C50412 Malignant neoplasm of upper-outer quadrant of left female breast: Secondary | ICD-10-CM

## 2022-05-11 DIAGNOSIS — F320 Major depressive disorder, single episode, mild: Secondary | ICD-10-CM

## 2022-05-11 DIAGNOSIS — F439 Reaction to severe stress, unspecified: Secondary | ICD-10-CM | POA: Diagnosis not present

## 2022-05-11 MED ORDER — ROSUVASTATIN CALCIUM 5 MG PO TABS: 5.0000 mg | ORAL_TABLET | Freq: Every day | ORAL | 3 refills | Status: DC

## 2022-05-12 LAB — COMPLETE METABOLIC PANEL WITH GFR
AG Ratio: 1.3 (calc) (ref 1.0–2.5)
ALT: 36 U/L — ABNORMAL HIGH (ref 6–29)
AST: 40 U/L — ABNORMAL HIGH (ref 10–35)
Albumin: 4.1 g/dL (ref 3.6–5.1)
Alkaline phosphatase (APISO): 88 U/L (ref 37–153)
BUN: 19 mg/dL (ref 7–25)
CO2: 27 mmol/L (ref 20–32)
Calcium: 9 mg/dL (ref 8.6–10.4)
Chloride: 105 mmol/L (ref 98–110)
Creat: 0.93 mg/dL (ref 0.50–1.03)
Globulin: 3.1 g/dL (calc) (ref 1.9–3.7)
Glucose, Bld: 117 mg/dL — ABNORMAL HIGH (ref 65–99)
Potassium: 4.2 mmol/L (ref 3.5–5.3)
Sodium: 142 mmol/L (ref 135–146)
Total Bilirubin: 0.5 mg/dL (ref 0.2–1.2)
Total Protein: 7.2 g/dL (ref 6.1–8.1)
eGFR: 75 mL/min/{1.73_m2} (ref >=60)

## 2022-05-12 LAB — CBC WITH DIFFERENTIAL/PLATELET
Absolute Monocytes: 560 cells/uL (ref 200–950)
Basophils Absolute: 28 cells/uL (ref 0–200)
Basophils Relative: 0.4 %
Eosinophils Absolute: 448 cells/uL (ref 15–500)
Eosinophils Relative: 6.4 %
HCT: 42.8 % (ref 35.0–45.0)
Hemoglobin: 13.7 g/dL (ref 11.7–15.5)
Lymphs Abs: 2044 cells/uL (ref 850–3900)
MCH: 25.6 pg — ABNORMAL LOW (ref 27.0–33.0)
MCHC: 32 g/dL (ref 32.0–36.0)
MCV: 79.9 fL — ABNORMAL LOW (ref 80.0–100.0)
MPV: 11.9 fL (ref 7.5–12.5)
Monocytes Relative: 8 %
Neutro Abs: 3920 cells/uL (ref 1500–7800)
Neutrophils Relative %: 56 %
Platelets: 228 10*3/uL (ref 140–400)
RBC: 5.36 10*6/uL — ABNORMAL HIGH (ref 3.80–5.10)
RDW: 15.1 % — ABNORMAL HIGH (ref 11.0–15.0)
Total Lymphocyte: 29.2 %
WBC: 7 10*3/uL (ref 3.8–10.8)

## 2022-05-12 LAB — HEMOGLOBIN A1C
Hgb A1c MFr Bld: 8.1 % of total Hgb — ABNORMAL HIGH (ref <5.7)
Mean Plasma Glucose: 186 mg/dL
eAG (mmol/L): 10.3 mmol/L

## 2022-05-12 LAB — LIPID PANEL
Cholesterol: 150 mg/dL (ref <200)
HDL: 51 mg/dL (ref >=50)
LDL Cholesterol (Calc): 80 mg/dL (calc)
Non-HDL Cholesterol (Calc): 99 mg/dL (calc) (ref <130)
Total CHOL/HDL Ratio: 2.9 (calc) (ref <5.0)
Triglycerides: 104 mg/dL (ref <150)

## 2022-05-12 LAB — MICROALBUMIN / CREATININE URINE RATIO
Creatinine, Urine: 174 mg/dL (ref 20–275)
Microalb Creat Ratio: 27 mcg/mg creat (ref <30)
Microalb, Ur: 4.7 mg/dL

## 2022-05-15 DIAGNOSIS — Z419 Encounter for procedure for purposes other than remedying health state, unspecified: Secondary | ICD-10-CM | POA: Diagnosis not present

## 2022-05-16 ENCOUNTER — Other Ambulatory Visit: Payer: Self-pay | Admitting: Family Medicine

## 2022-05-16 DIAGNOSIS — E1165 Type 2 diabetes mellitus with hyperglycemia: Secondary | ICD-10-CM

## 2022-05-16 MED ORDER — SEMAGLUTIDE(0.25 OR 0.5MG/DOS) 2 MG/3ML ~~LOC~~ SOPN: PEN_INJECTOR | SUBCUTANEOUS | 1 refills | Status: DC

## 2022-05-17 ENCOUNTER — Encounter: Payer: Self-pay | Admitting: Family Medicine

## 2022-05-19 ENCOUNTER — Encounter: Payer: Self-pay | Admitting: Family Medicine

## 2022-05-19 DIAGNOSIS — F320 Major depressive disorder, single episode, mild: Secondary | ICD-10-CM | POA: Insufficient documentation

## 2022-05-19 DIAGNOSIS — N3281 Overactive bladder: Secondary | ICD-10-CM | POA: Insufficient documentation

## 2022-05-19 NOTE — Assessment & Plan Note (Addendum)
discussion on ozempic as option, no containdications, educated on diet/lifestyle efforts, can do consult with nutritionist  Multiple associated comorbidities including uncontrolled diabetes, hypertension, hyperlipidemia

## 2022-05-19 NOTE — Assessment & Plan Note (Signed)
Per urology patient on multiple medications, reviewed last office visit and her current meds

## 2022-05-19 NOTE — Assessment & Plan Note (Signed)
Do not have full prior workup or specific diagnosis, will monitor CBCs Patient without any symptoms concerning for recurrent blood clots or bleeding

## 2022-05-19 NOTE — Assessment & Plan Note (Addendum)
Has improved over time Her husband will get out of prison in about 6 months they do visit regularly She has continued the medications and stress anxiety and mood have improved PHQ-9 was reviewed and mood is significantly improved she will stay on Zoloft Unfortunately GAD-7 was not repeated today    05/11/2022   11:07 AM 08/29/2021   11:07 AM 09/28/2020    9:46 AM  Depression screen PHQ 2/9  Decreased Interest 0 0 1  Down, Depressed, Hopeless 0 0 1  PHQ - 2 Score 0 0 2  Altered sleeping 0 0 3  Tired, decreased energy 0 0 2  Change in appetite 0 0 1  Feeling bad or failure about yourself  0 0 1  Trouble concentrating 0 0 1  Moving slowly or fidgety/restless 0 0 0  Suicidal thoughts 0 0 0  PHQ-9 Score 0 0 10  Difficult doing work/chores  Not difficult at all Somewhat difficult      09/28/2020    9:52 AM  GAD 7 : Generalized Anxiety Score  Nervous, Anxious, on Edge 1  Control/stop worrying 3  Worry too much - different things 3  Trouble relaxing 1  Restless 1  Easily annoyed or irritable 2  Afraid - awful might happen 2  Total GAD 7 Score 13  Anxiety Difficulty Somewhat difficult

## 2022-05-19 NOTE — Assessment & Plan Note (Signed)
Blood pressure at goal today BP Readings from Last 3 Encounters:  05/11/22 128/78  04/21/22 130/65  04/14/22 (!) 159/96  On lisinopril HCTZ 10-12.5 Good compliance, no side effects or concerns, continue medication, labs due today

## 2022-05-19 NOTE — Assessment & Plan Note (Signed)
Symptoms stable Can continue omeprazole over-the-counter as needed did encourage her to use it for few days or week or 2 at a time when she has severe persistent symptoms and needs it preventatively she also was encouraged to take breaks from it and can use Pepcid over-the-counter Encouraged to continue diet and lifestyle efforts

## 2022-05-19 NOTE — Assessment & Plan Note (Deleted)
discussion on ozempic as option, no containdications, educated on diet/lifestyle efforts, can do consult with nutritionist  Multiple associated comorbidities including uncontrolled diabetes, hypertension, hyperlipidemia

## 2022-05-19 NOTE — Assessment & Plan Note (Addendum)
Lost to follow-up after last office visit in April, she lost insurance but she was able to continue her metformin she has been doing maximum dose, due for labs and follow-up today Now that she has insurance she was encouraged to do a diabetic eye exam Labs per orders see below Extensive discussion today about GLP-1's and other medications which she could use to help get diabetes controlled she has no history or family history of thyroid cancer or M EN 2 syndrome, no personal history of pancreatitis Reviewed her insurance coverage it does look like they will cover Ozempic if she has failed to control disease with metformin or other medications but we would likely have to do a prior authorization

## 2022-05-19 NOTE — Assessment & Plan Note (Signed)
Was previously monitored by oncology Mammogram was ordered in April patient was encouraged to call and schedule a follow-up mammogram which is due

## 2022-05-19 NOTE — Assessment & Plan Note (Signed)
See above PHQ-9 reviewed today negative and patient will continue on same medications without dose adjustments at this time

## 2022-05-23 NOTE — Progress Notes (Deleted)
04/14/2022 9:19 PM   Elizabeth Hoffman Dick 02/07/1971 [id number]  Referring provider: Delsa Grana, PA-C [address] Elizabeth Hoffman,  [place] [address]  Urological history: 1. OAB -contributing factors of diabetes, depression, vaginal atrophy, pelvic surgery, antihistamines, sleep apnea, decongestants and diuretics  -cysto (2019) - NED -PVR 10 mL -managed with Myrbetriq 50 mg daily and Toviaz 4 mg daily  2. Incontinence -contributing factors of obesity, diabetes, depression, vaginal atrophy, pelvic surgery, antihistamines, decongestants and diuretics  -PVR 10 mL  3. Nocturia -contributing factors of obstructive sleep apnea, hypertension, diabetes, anxiety and depression -not on CPAP   Chief Complaint  Patient presents with   Follow-up     HPI: Elizabeth Hoffman is a 52 y.o. female who presents today for 6-week follow-up after restarting her OAB meds.  PVR ***    PMH: Past Medical History:  Diagnosis Date   Anxiety    Arthritis    Breast cancer ([place]) 2016   Depression    Hot flashes    Hypertension    OSA on CPAP 11/03/2016   Personal history of radiation therapy     Surgical History: Past Surgical History:  Procedure Laterality Date   ABDOMINAL HYSTERECTOMY  06/15/2017   BREAST EXCISIONAL BIOPSY Left 03/18/2015   breast ca +   BREAST LUMPECTOMY WITH RADIOACTIVE SEED LOCALIZATION Left 03/18/2015   Procedure: BREAST LUMPECTOMY WITH RADIOACTIVE SEED LOCALIZATION;  Surgeon: Erroll Luna, MD;  Location: [facility];  Service: General;  Laterality: Left;   CARPAL TUNNEL RELEASE Bilateral 04/16/2020   CERVICAL BIOPSY  W/ LOOP ELECTRODE EXCISION     CHOLECYSTECTOMY     HERNIA REPAIR     MASTECTOMY, PARTIAL Left 02/15/2015   NASAL SEPTUM SURGERY     TONSILLECTOMY AND ADENOIDECTOMY     UVULECTOMY      Home Medications:  Allergies as of 04/14/2022   No Known Allergies      Medication List        Accurate as of April 14, 2022 11:59  PM. If you have any questions, ask your nurse or doctor.          fesoterodine 4 MG Tb24 tablet Commonly known as: Toviaz Take 1 tablet (4 mg total) by mouth daily.   fluticasone 50 MCG/ACT nasal spray Commonly known as: FLONASE Place 2 sprays into both nostrils daily. Can use twice daily for the first 3 days, then return to once daily   lisinopril-hydrochlorothiazide 10-12.5 MG tablet Commonly known as: ZESTORETIC Take 1 tablet by mouth daily.   meloxicam 15 MG tablet Commonly known as: MOBIC TAKE 1 TABLET BY MOUTH DAILY   metFORMIN 500 MG 24 hr tablet Commonly known as: GLUCOPHAGE-XR TAKE 3 TABLETS BY MOUTH DAILY WITH BREAKFAST   mirabegron ER 50 MG Tb24 tablet Commonly known as: MYRBETRIQ Take 1 tablet (50 mg total) by mouth daily.   omeprazole 20 MG capsule Commonly known as: PRILOSEC Take 20 mg by mouth daily.   rosuvastatin 5 MG tablet Commonly known as: Crestor Take 1 tablet (5 mg total) by mouth at bedtime.   sertraline 100 MG tablet Commonly known as: ZOLOFT TAKE 1 TABLET BY MOUTH ONCE DAILY        Allergies: No Known Allergies  Family History: Family History  Adopted: Yes  Problem Relation Age of Onset   Breast cancer Neg Hx     Social History:  reports that she has never smoked. She has never used smokeless tobacco. She reports that she  does not drink alcohol and does not use drugs.  ROS: For pertinent review of systems please refer to history of present illness  Physical Exam: BP (!) 159/96   Pulse 74   Ht 5' 9"$  (1.753 m)   Wt (!) 365 lb (165.6 kg)   LMP 02/18/2015 (Approximate)   BMI 53.90 kg/m   Constitutional:  Well nourished. Alert and oriented, No acute distress. HEENT: [place] AT, moist mucus membranes.  Trachea midline, no masses. Cardiovascular: No clubbing, cyanosis, or edema. Respiratory: Normal respiratory effort, no increased work of breathing. GU: No CVA tenderness.  No bladder fullness or masses. Vulvovaginal atrophy w/  pallor, loss of rugae, introital retraction, excoriations.  Vulvar thinning, fusion of labia, clitoral hood retraction, prominent urethral meatus.   *** external genitalia, *** pubic hair distribution, no lesions.  Normal urethral meatus, no lesions, no prolapse, no discharge.   No urethral masses, tenderness and/or tenderness. No bladder fullness, tenderness or masses. *** vagina mucosa, *** estrogen effect, no discharge, no lesions, *** pelvic support, *** cystocele and *** rectocele noted.  No cervical motion tenderness.  Uterus is freely mobile and non-fixed.  No adnexal/parametria masses or tenderness noted.  Anus and perineum are without rashes or lesions.   ***  Neurologic: Grossly intact, no focal deficits, moving all 4 extremities. Psychiatric: Normal mood and affect.    Laboratory Data: Hemoglobin A1c (04/2022) 8.1 Serum creatinine (04/2022) 0.93 Urinalysis Results for orders placed or performed in visit on 04/14/22  Microscopic Examination   Urine  Result Value Ref Range   WBC, UA 0-5 0 - 5 /hpf   RBC, Urine 0-2 0 - 2 /hpf   Epithelial Cells (non renal) >10 (A) 0 - 10 /hpf   Bacteria, UA Moderate (A) None seen/Few  Urinalysis, Complete  Result Value Ref Range   Specific Gravity, UA 1.030 1.005 - 1.030   pH, UA 5.0 5.0 - 7.5   Color, UA Yellow Yellow   Appearance Ur Hazy (A) Clear   Leukocytes,UA Negative Negative   Protein,UA 2+ (A) Negative/Trace   Glucose, UA Negative Negative   Ketones, UA Negative Negative   RBC, UA Negative Negative   Bilirubin, UA Negative Negative   Urobilinogen, Ur 0.2 0.2 - 1.0 mg/dL   Nitrite, UA Negative Negative   Microscopic Examination See below:   Bladder Scan (Post Void Residual) in office  Result Value Ref Range   Scan Result 10   I have reviewed the labs.    Pertinent Imaging: ***  Assessment & Plan:    1. Mixed incontinence -She has been without her medication for 6 weeks and her urinary symptoms are significantly  worsened -Refilled her Toviaz 4 mg daily and Myrbetriq 50 mg daily  2. Nocturia -continue CPAP machine  3. Urgency -see # 1  Return in about 6 weeks (around 05/26/2022) for 6 week follow up oab .  These notes generated with voice recognition software. I apologize for typographical errors.  [place], [place] [address]  [place], [place] [address] [phone]

## 2022-05-24 ENCOUNTER — Ambulatory Visit: Payer: Medicaid Other | Admitting: Urology

## 2022-05-24 DIAGNOSIS — N3946 Mixed incontinence: Secondary | ICD-10-CM

## 2022-05-24 DIAGNOSIS — R3915 Urgency of urination: Secondary | ICD-10-CM

## 2022-05-24 DIAGNOSIS — R351 Nocturia: Secondary | ICD-10-CM

## 2022-06-01 ENCOUNTER — Other Ambulatory Visit: Payer: Self-pay | Admitting: Urology

## 2022-06-01 ENCOUNTER — Encounter: Payer: Self-pay | Admitting: Family Medicine

## 2022-06-01 ENCOUNTER — Other Ambulatory Visit: Payer: Self-pay | Admitting: Family Medicine

## 2022-06-01 DIAGNOSIS — R3915 Urgency of urination: Secondary | ICD-10-CM

## 2022-06-01 DIAGNOSIS — R351 Nocturia: Secondary | ICD-10-CM

## 2022-06-01 DIAGNOSIS — N3946 Mixed incontinence: Secondary | ICD-10-CM

## 2022-06-02 ENCOUNTER — Other Ambulatory Visit: Payer: Self-pay | Admitting: Urology

## 2022-06-02 ENCOUNTER — Other Ambulatory Visit: Payer: Self-pay | Admitting: Family Medicine

## 2022-06-02 DIAGNOSIS — F320 Major depressive disorder, single episode, mild: Secondary | ICD-10-CM

## 2022-06-02 DIAGNOSIS — R351 Nocturia: Secondary | ICD-10-CM

## 2022-06-02 DIAGNOSIS — N3946 Mixed incontinence: Secondary | ICD-10-CM

## 2022-06-02 DIAGNOSIS — R3915 Urgency of urination: Secondary | ICD-10-CM

## 2022-06-02 MED ORDER — SERTRALINE HCL 100 MG PO TABS: 100.0000 mg | ORAL_TABLET | Freq: Every day | ORAL | 0 refills | Status: DC

## 2022-06-02 MED ORDER — FESOTERODINE FUMARATE ER 4 MG PO TB24: 4.0000 mg | ORAL_TABLET | Freq: Every day | ORAL | 0 refills | Status: DC

## 2022-06-15 DIAGNOSIS — Z419 Encounter for procedure for purposes other than remedying health state, unspecified: Secondary | ICD-10-CM | POA: Diagnosis not present

## 2022-06-19 NOTE — Progress Notes (Signed)
06/20/2022 9:20 AM   [name] 1970-10-22 [id number]  Referring provider: Delsa Grana, PA-C [address] [place],  [place] [address]  Urological history: 1. OAB -contributing factors of diabetes, depression, vaginal atrophy, pelvic surgery, antihistamines, sleep apnea, decongestants and diuretics  -cysto (2019) - NED -PVR 10 mL -managed with Myrbetriq 50 mg daily and Toviaz 4 mg daily  2. Incontinence -contributing factors of obesity, diabetes, depression, vaginal atrophy, pelvic surgery, antihistamines, decongestants and diuretics  -PVR 10 mL  3. Nocturia -contributing factors of obstructive sleep apnea, hypertension, diabetes, anxiety and depression -not on CPAP   Chief Complaint  Patient presents with   Urinary Frequency     HPI: Elizabeth Hoffman is a 52 y.o. female who presents today for 6-week follow-up after restarting her OAB meds.  She is having 8 or more daytime urinations, nocturia x 3 or more with a severe urge to urinate.  She is having urge incontinence.  She is leaking 3 more times daily.  She is going through 8 absorbent pads daily.  She is limiting her fluid intake in order to reduce trips to the bathroom.  She does engage in toilet mapping.  Patient denies any modifying or aggravating factors.  Patient denies any gross hematuria, dysuria or suprapubic/flank pain.  Patient denies any fevers, chills, nausea or vomiting.    She states that she is completely dry on Toviaz 4 mg daily and Myrbetriq 50 mg daily  PVR 0 mL   PMH: Past Medical History:  Diagnosis Date   Anxiety    Arthritis    Breast cancer ([place]) 2016   Depression    Hot flashes    Hypertension    OSA on CPAP 11/03/2016   Personal history of radiation therapy     Surgical History: Past Surgical History:  Procedure Laterality Date   ABDOMINAL HYSTERECTOMY  06/15/2017   BREAST EXCISIONAL BIOPSY Left 03/18/2015   breast ca +   BREAST LUMPECTOMY WITH RADIOACTIVE  SEED LOCALIZATION Left 03/18/2015   Procedure: BREAST LUMPECTOMY WITH RADIOACTIVE SEED LOCALIZATION;  Surgeon: Erroll Luna, MD;  Location: [facility];  Service: General;  Laterality: Left;   CARPAL TUNNEL RELEASE Bilateral 04/16/2020   CERVICAL BIOPSY  W/ LOOP ELECTRODE EXCISION     CHOLECYSTECTOMY     HERNIA REPAIR     MASTECTOMY, PARTIAL Left 02/15/2015   NASAL SEPTUM SURGERY     TONSILLECTOMY AND ADENOIDECTOMY     UVULECTOMY      Home Medications:  Allergies as of 06/20/2022   No Known Allergies      Medication List        Accurate as of June 20, 2022  9:20 AM. If you have any questions, ask your nurse or doctor.          fesoterodine 4 MG Tb24 tablet Commonly known as: TOVIAZ Take 1 tablet (4 mg total) by mouth daily.   fluticasone 50 MCG/ACT nasal spray Commonly known as: FLONASE Place 2 sprays into both nostrils daily. Can use twice daily for the first 3 days, then return to once daily   lisinopril-hydrochlorothiazide 10-12.5 MG tablet Commonly known as: ZESTORETIC TAKE 1 TABLET BY MOUTH DAILY.   meloxicam 15 MG tablet Commonly known as: MOBIC Take 1 tablet (15 mg total) by mouth daily.   metFORMIN 500 MG 24 hr tablet Commonly known as: GLUCOPHAGE-XR TAKE 3 TABLETS BY MOUTH DAILY WITH BREAKFAST   mirabegron ER 50 MG Tb24 tablet Commonly known as:  MYRBETRIQ Take 1 tablet (50 mg total) by mouth daily. Started by: Zara Council, PA-C   omeprazole 20 MG capsule Commonly known as: PRILOSEC Take 20 mg by mouth daily.   rosuvastatin 5 MG tablet Commonly known as: Crestor Take 1 tablet (5 mg total) by mouth daily.   Semaglutide(0.25 or 0.'5MG'$ /DOS) 2 MG/3ML Sopn Inject 0.25 mg into the skin once a week for 14 days, THEN 0.5 mg once a week for 28 days. Start taking on: May 16, 2022   sertraline 100 MG tablet Commonly known as: ZOLOFT Take 1 tablet (100 mg total) by mouth daily.        Allergies: No Known Allergies  Family  History: Family History  Adopted: Yes  Problem Relation Age of Onset   Breast cancer Neg Hx     Social History:  reports that she has never smoked. She has never used smokeless tobacco. She reports that she does not drink alcohol and does not use drugs.  ROS: For pertinent review of systems please refer to history of present illness  Physical Exam: BP (!) 179/76   Pulse 69   Ht '5\' 9"'$  (1.753 m)   Wt (!) 368 lb (166.9 kg)   LMP 02/18/2015 (Approximate)   BMI 54.34 kg/m   Constitutional:  Well nourished. Alert and oriented, No acute distress. HEENT: [place] AT, moist mucus membranes.  Trachea midline Cardiovascular: No clubbing, cyanosis, or edema. Respiratory: Normal respiratory effort, no increased work of breathing. Neurologic: Grossly intact, no focal deficits, moving all 4 extremities. Psychiatric: Normal mood and affect.    Laboratory Data: Hemoglobin A1c (04/2022) 8.1 Serum creatinine (04/2022) 0.93 Urinalysis Results for orders placed or performed in visit on 06/20/22  Bladder Scan (Post Void Residual) in office  Result Value Ref Range   Scan Result 0   I have reviewed the labs.    Pertinent Imaging:  06/20/22 08:57  Scan Result 0    Assessment & Plan:    1. Mixed incontinence -She has been on her [name] and Myrbetriq for approximately 1 month and that is why her symptoms were so severe on the OAB questionnaire -She states that she is completely dry when she takes both the Myrbetriq and Toviaz  2. Nocturia -continue CPAP machine  3. Urgency -see # 1  Return in about 1 year (around 06/21/2023) for PVR and OAB questionnaire.  These notes generated with voice recognition software. I apologize for typographical errors.  [place], [place] [address]  [place], [place] [address] [phone]

## 2022-06-20 ENCOUNTER — Encounter: Payer: Self-pay | Admitting: Urology

## 2022-06-20 ENCOUNTER — Ambulatory Visit (INDEPENDENT_AMBULATORY_CARE_PROVIDER_SITE_OTHER): Payer: Medicaid Other | Admitting: Urology

## 2022-06-20 VITALS — BP 179/76 | HR 69 | Ht 69.0 in | Wt 368.0 lb

## 2022-06-20 DIAGNOSIS — R351 Nocturia: Secondary | ICD-10-CM

## 2022-06-20 DIAGNOSIS — N3946 Mixed incontinence: Secondary | ICD-10-CM

## 2022-06-20 DIAGNOSIS — R3915 Urgency of urination: Secondary | ICD-10-CM

## 2022-06-20 LAB — BLADDER SCAN AMB NON-IMAGING: Scan Result: 0

## 2022-06-20 MED ORDER — MIRABEGRON ER 50 MG PO TB24: 50.0000 mg | ORAL_TABLET | Freq: Every day | ORAL | 3 refills | Status: DC

## 2022-06-20 MED ORDER — FESOTERODINE FUMARATE ER 4 MG PO TB24: 4.0000 mg | ORAL_TABLET | Freq: Every day | ORAL | 3 refills | Status: DC

## 2022-06-26 ENCOUNTER — Other Ambulatory Visit: Payer: Self-pay | Admitting: Family Medicine

## 2022-06-26 DIAGNOSIS — E1165 Type 2 diabetes mellitus with hyperglycemia: Secondary | ICD-10-CM

## 2022-07-05 ENCOUNTER — Encounter: Payer: Self-pay | Admitting: Family Medicine

## 2022-07-05 ENCOUNTER — Ambulatory Visit (INDEPENDENT_AMBULATORY_CARE_PROVIDER_SITE_OTHER): Payer: Medicaid Other | Admitting: Family Medicine

## 2022-07-05 VITALS — BP 150/82 | HR 94 | Temp 98.8°F | Resp 16 | Ht 69.0 in | Wt 365.4 lb

## 2022-07-05 DIAGNOSIS — R509 Fever, unspecified: Secondary | ICD-10-CM

## 2022-07-05 DIAGNOSIS — M791 Myalgia, unspecified site: Secondary | ICD-10-CM

## 2022-07-05 DIAGNOSIS — U071 COVID-19: Secondary | ICD-10-CM

## 2022-07-05 DIAGNOSIS — R52 Pain, unspecified: Secondary | ICD-10-CM | POA: Diagnosis not present

## 2022-07-05 LAB — POCT INFLUENZA A/B
Influenza A, POC: NEGATIVE
Influenza B, POC: NEGATIVE

## 2022-07-05 NOTE — Progress Notes (Unsigned)
Patient ID: Elizabeth Hoffman, female    DOB: 1970/06/13, 52 y.o.   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 out with illness though she hasn't been told anything in particular is going around She has no uri sx, sore throat, cough or SOB This does feel similar to when she had the flu several years ago Flu in office is negative   Patient Active Problem List   Diagnosis Date Noted   OAB (overactive bladder) 05/19/2022   Current mild episode of major depressive disorder, unspecified whether recurrent ([place]) 05/19/2022   Uncontrolled type 2 diabetes mellitus with hyperglycemia, without long-term current use of insulin ([place]) 10/01/2020   Situational stress 09/28/2020   Cubital tunnel syndrome 11/11/2018   Cervical radiculopathy 05/23/2018   OSA on CPAP 11/03/2016   History of cervical cancer 02/02/2016   Hypercoagulable state ([place]) 02/02/2016   Abnormal uterine bleeding (AUB) 10/02/2015   Obesity, morbid, BMI 50 or higher ([place]) 07/05/2015   Malignant neoplasm of upper-outer quadrant of left breast in female, estrogen receptor positive ([place]) 02/22/2015   Mixed hyperlipidemia 02/08/2015   GERD without esophagitis 02/03/2015   Primary hypertension 02/03/2015   De Quervain's tenosynovitis, bilateral 02/03/2015   Numerous moles 02/03/2015   History of loop electrosurgical excision procedure (LEEP) of cervix 02/03/2015      Current Outpatient Medications:    fesoterodine (TOVIAZ) 4 MG TB24 tablet, Take 1 tablet (4 mg total) by mouth daily., Disp: 90 tablet, Rfl: 3   fluticasone (FLONASE) 50 MCG/ACT nasal spray, Place 2 sprays into both nostrils  daily. Can use twice daily for the first 3 days, then return to once daily, Disp: 9.9 mL, Rfl: 1   lisinopril-hydrochlorothiazide (ZESTORETIC) 10-12.5 MG tablet, TAKE 1 TABLET BY MOUTH DAILY., Disp: 90 tablet, Rfl: 0   meloxicam (MOBIC) 15 MG tablet, Take 1 tablet (15 mg total) by mouth daily., Disp: 90 tablet, Rfl: 1   metFORMIN (GLUCOPHAGE-XR) 500 MG 24 hr tablet, TAKE 3 TABLETS BY MOUTH DAILY WITH BREAKFAST, Disp: 270 tablet, Rfl: 0   mirabegron ER (MYRBETRIQ) 50 MG TB24 tablet, Take 1 tablet (50 mg total) by mouth daily., Disp: 90 tablet, Rfl: 3   omeprazole (PRILOSEC) 20 MG capsule, Take 20 mg by mouth daily., Disp: , Rfl:    OZEMPIC, 0.25 OR 0.5 MG/DOSE, 2 MG/3ML SOPN, INJECT 0.25MG INTO THE SKIN ONCE A WEEK FOR 14 DAYS AND THEN INJECT 0.5MG ONCE AWEEK AS DIRECTED, Disp: 3 mL, Rfl: 1   rosuvastatin (CRESTOR) 5 MG tablet, Take 1 tablet (5 mg total) by mouth daily., Disp: 90 tablet, Rfl: 3   sertraline (ZOLOFT) 100 MG tablet, Take 1 tablet (100 mg total) by mouth daily., Disp: 90 tablet, Rfl: 0  Current Facility-Administered Medications:    betamethasone acetate-betamethasone sodium phosphate (CELESTONE) injection 3 mg, 3 mg, Intra-articular, Once, Evans, Brent M, DPM   No Known Allergies   Social History   Tobacco Use   Smoking status: Never   Smokeless tobacco: Never  Vaping Use   Vaping Use: Never used  Substance Use Topics   Alcohol use: No  Alcohol/week: 0.0 standard drinks of alcohol   Drug use: No      Chart Review Today: I personally reviewed active problem list, medication list, allergies, family history, social history, health maintenance, notes from last encounter, lab results, imaging with the patient/caregiver today.   Review of Systems  Constitutional: Negative.   HENT: Negative.    Eyes: Negative.   Respiratory: Negative.    Cardiovascular: Negative.   Gastrointestinal: Negative.   Endocrine: Negative.   Genitourinary: Negative.   Musculoskeletal:  Negative.   Skin: Negative.   Allergic/Immunologic: Negative.   Neurological: Negative.   Hematological: Negative.   Psychiatric/Behavioral: Negative.    All other systems reviewed and are negative.      Objective:   Vitals:   07/05/22 1537  BP: (!) 150/82  Pulse: 94  Resp: 16  Temp: 98.8 F (37.1 C)  TempSrc: Oral  SpO2: 93%  Weight: (!) 365 lb 6.4 oz (165.7 kg)  Height: 5' 9"$  (1.753 m)    Body mass index is 53.96 kg/m.  Physical Exam Vitals and nursing note reviewed.  Constitutional:      General: She is not in acute distress.    Appearance: She is well-developed. She is obese. She is not ill-appearing, toxic-appearing or diaphoretic.     Comments: Appears a little tired, NAD, non-toxic appearing  HENT:     Head: Normocephalic and atraumatic.     Nose: Nose normal.  Eyes:     General:        Right eye: No discharge.        Left eye: No discharge.     Conjunctiva/sclera: Conjunctivae normal.  Neck:     Trachea: No tracheal deviation.  Cardiovascular:     Rate and Rhythm: Normal rate and regular rhythm.     Heart sounds: Normal heart sounds. No murmur heard.    No friction rub. No gallop.  Pulmonary:     Effort: Pulmonary effort is normal. No respiratory distress.     Breath sounds: Normal breath sounds. No stridor. No wheezing, rhonchi or rales.  Musculoskeletal:        General: Normal range of motion.  Skin:    General: Skin is warm and dry.     Findings: No rash.  Neurological:     Mental Status: She is alert.     Motor: No abnormal muscle tone.     Coordination: Coordination normal.  Psychiatric:        Behavior: Behavior normal.      Results for orders placed or performed in visit on 06/20/22  Bladder Scan (Post Void Residual) in office  Result Value Ref Range   Scan Result 0        Assessment & Plan:   1. Febrile illness - POCT Influenza A/B  2. Myalgia  3. Body aches - POCT Influenza A/B   Out of work with febrile illness until  no fever for more than 24 hours (w/o meds) Push fluids, rest, can use tylenol and ibuprofen for fever/myalgias Test at home for covid and notify us if positive  Work note given Discussed antiviral med options  Pt later reported home COVID test was positive: 4. Positive self-administered antigen test for COVID-19  - nirmatrelvir/ritonavir (PAXLOVID) 20 x 150 MG & 10 x 100MG TABS; Take 3 tablets by mouth 2 (two) times daily for 5 days. Patient GFR is 75 Take nirmatrelvir (150 mg) two tablets twice daily for 5 days and ritonavir (100 mg) one tablet twice daily  for 5 days.  Dispense: 30 tablet; Refill: 0  Onset of sx Tuesday 2/20, isolate at home day 1-5, 2/21- 2/25, can RTW if fever free and improving sx on 2/26 with masking for 5 day - should remain out of work longer if febrile or any worsening sx - she should contact us to f/up and adjust work note, etc    Elizabeth Grana, PA-C 07/05/22 3:56 PM

## 2022-07-06 ENCOUNTER — Other Ambulatory Visit: Payer: Self-pay

## 2022-07-06 ENCOUNTER — Encounter: Payer: Self-pay | Admitting: Family Medicine

## 2022-07-06 DIAGNOSIS — R509 Fever, unspecified: Secondary | ICD-10-CM

## 2022-07-06 DIAGNOSIS — M791 Myalgia, unspecified site: Secondary | ICD-10-CM

## 2022-07-06 DIAGNOSIS — U071 COVID-19: Secondary | ICD-10-CM

## 2022-07-06 MED ORDER — NIRMATRELVIR/RITONAVIR (PAXLOVID)TABLET: 3.0000 | ORAL_TABLET | Freq: Two times a day (BID) | ORAL | 0 refills | Status: DC

## 2022-07-06 MED ORDER — NIRMATRELVIR/RITONAVIR (PAXLOVID)TABLET: 3.0000 | ORAL_TABLET | Freq: Two times a day (BID) | ORAL | 0 refills | Status: AC

## 2022-07-10 ENCOUNTER — Encounter: Payer: Self-pay | Admitting: Family Medicine

## 2022-07-12 ENCOUNTER — Telehealth (INDEPENDENT_AMBULATORY_CARE_PROVIDER_SITE_OTHER): Payer: Medicaid Other | Admitting: Family Medicine

## 2022-07-12 DIAGNOSIS — R197 Diarrhea, unspecified: Secondary | ICD-10-CM | POA: Diagnosis not present

## 2022-07-12 DIAGNOSIS — R110 Nausea: Secondary | ICD-10-CM | POA: Diagnosis not present

## 2022-07-12 DIAGNOSIS — U071 COVID-19: Secondary | ICD-10-CM | POA: Diagnosis not present

## 2022-07-12 DIAGNOSIS — T887XXA Unspecified adverse effect of drug or medicament, initial encounter: Secondary | ICD-10-CM | POA: Diagnosis not present

## 2022-07-12 MED ORDER — LOPERAMIDE HCL 2 MG PO CAPS: 2.0000 mg | ORAL_CAPSULE | Freq: Four times a day (QID) | ORAL | 0 refills | Status: DC | PRN

## 2022-07-12 MED ORDER — FAMOTIDINE 20 MG PO TABS: 20.0000 mg | ORAL_TABLET | Freq: Two times a day (BID) | ORAL | 0 refills | Status: DC

## 2022-07-12 NOTE — Progress Notes (Signed)
Name: Elizabeth Hoffman   MRN: [id number]    DOB: Sep 18, 1970   Date:07/12/2022       Progress Note  Subjective:    Chief Complaint  Chief Complaint  Patient presents with   Diarrhea    Still not eating well sx since previous appointment   Nausea    I connected with  [name]  on 07/12/22 at  2:00 PM EST by a video enabled telemedicine application and verified that I am speaking with the correct person using two identifiers.  I discussed the limitations of evaluation and management by telemedicine and the availability of in person appointments. The patient expressed understanding and agreed to proceed. Staff also discussed with the patient that there may be a patient responsible charge related to this service. Patient Location: home Provider Location: [facility] clinic Additional Individuals present: none   Diarrhea    PT recently ill and presented in office with viral sx and was positive for COVID.  She took paxlovid Over the past week she had continued viral sx and most sx improved except for N/V/D  Vomiting as stopped but she still feels nauseous. Diarrhea is still frequent and profuse - watery, often has to run to the bathroom as soon as she tries to eat anything even having some incontinence of stool     Patient Active Problem List   Diagnosis Date Noted   OAB (overactive bladder) 05/19/2022   Current mild episode of major depressive disorder, unspecified whether recurrent ([place]) 05/19/2022   Uncontrolled type 2 diabetes mellitus with hyperglycemia, without long-term current use of insulin ([place]) 10/01/2020   Situational stress 09/28/2020   Cubital tunnel syndrome 11/11/2018   Cervical radiculopathy 05/23/2018   OSA on CPAP 11/03/2016   History of cervical cancer 02/02/2016   Hypercoagulable state ([place]) 02/02/2016   Abnormal uterine bleeding (AUB) 10/02/2015   Obesity, morbid, BMI 50 or higher ([place]) 07/05/2015   Malignant neoplasm of upper-outer quadrant of left  breast in female, estrogen receptor positive ([place]) 02/22/2015   Mixed hyperlipidemia 02/08/2015   GERD without esophagitis 02/03/2015   Primary hypertension 02/03/2015   De Quervain's tenosynovitis, bilateral 02/03/2015   Numerous moles 02/03/2015   History of loop electrosurgical excision procedure (LEEP) of cervix 02/03/2015    Social History   Tobacco Use   Smoking status: Never   Smokeless tobacco: Never  Substance Use Topics   Alcohol use: No    Alcohol/week: 0.0 standard drinks of alcohol     Current Outpatient Medications:    fesoterodine (TOVIAZ) 4 MG TB24 tablet, Take 1 tablet (4 mg total) by mouth daily., Disp: 90 tablet, Rfl: 3   fluticasone (FLONASE) 50 MCG/ACT nasal spray, Place 2 sprays into both nostrils daily. Can use twice daily for the first 3 days, then return to once daily, Disp: 9.9 mL, Rfl: 1   lisinopril-hydrochlorothiazide (ZESTORETIC) 10-12.5 MG tablet, TAKE 1 TABLET BY MOUTH DAILY., Disp: 90 tablet, Rfl: 0   meloxicam (MOBIC) 15 MG tablet, Take 1 tablet (15 mg total) by mouth daily., Disp: 90 tablet, Rfl: 1   metFORMIN (GLUCOPHAGE-XR) 500 MG 24 hr tablet, TAKE 3 TABLETS BY MOUTH DAILY WITH BREAKFAST, Disp: 270 tablet, Rfl: 0   mirabegron ER (MYRBETRIQ) 50 MG TB24 tablet, Take 1 tablet (50 mg total) by mouth daily., Disp: 90 tablet, Rfl: 3   omeprazole (PRILOSEC) 20 MG capsule, Take 20 mg by mouth daily., Disp: , Rfl:    OZEMPIC, 0.25 OR 0.5 MG/DOSE, 2 MG/3ML SOPN, INJECT  0.'25MG'$  INTO THE SKIN ONCE A WEEK FOR 14 DAYS AND THEN INJECT 0.'5MG'$  ONCE AWEEK AS DIRECTED, Disp: 3 mL, Rfl: 1   rosuvastatin (CRESTOR) 5 MG tablet, Take 1 tablet (5 mg total) by mouth daily., Disp: 90 tablet, Rfl: 3   sertraline (ZOLOFT) 100 MG tablet, Take 1 tablet (100 mg total) by mouth daily., Disp: 90 tablet, Rfl: 0  Current Facility-Administered Medications:    betamethasone acetate-betamethasone sodium phosphate (CELESTONE) injection 3 mg, 3 mg, Intra-articular, Once, Evans, Dorathy Daft,  DPM  No Known Allergies   I personally reviewed active problem list, medication list, allergies, family history, social history, health maintenance, notes from last encounter, lab results, imaging with the patient/caregiver today.   Review of Systems  Constitutional: Negative.   HENT: Negative.    Eyes: Negative.   Respiratory: Negative.    Cardiovascular: Negative.   Gastrointestinal:  Positive for diarrhea.  Endocrine: Negative.   Genitourinary: Negative.   Musculoskeletal: Negative.   Skin: Negative.   Allergic/Immunologic: Negative.   Neurological: Negative.   Hematological: Negative.   Psychiatric/Behavioral: Negative.    All other systems reviewed and are negative.   Objective:   Virtual encounter, vitals limited, only able to obtain the following There were no vitals filed for this visit. There is no height or weight on file to calculate BMI. Nursing Note and Vital Signs reviewed.  Physical Exam  PE limited by virtual encounter  No results found for this or any previous visit (from the past 72 hour(s)).  Assessment and Plan:     ICD-10-CM   1. Diarrhea, unspecified type  R19.7 loperamide (IMODIUM) 2 MG capsule   can try immodium and continue to slowly advance diet, recommend OV next week if not improving (for exam, labs)    2. COVID-19 virus infection  U07.1    onset of sx last week 8 d ago, fever body aches are better, but shes had GI sx continue    3. Nausea  R11.0 famotidine (PEPCID) 20 MG tablet   continue omeprazole and add pepcid bid prn while slowly advancing diet    4. Medication side effect  T88.7XXA    ?  paxlovid SE? vs GI sx of covid?     Return for f/up next week if GI sx aren't improving.   -Red flags and when to present for emergency care or RTC including fever >101.66F, chest pain, shortness of breath, new/worsening/un-resolving symptoms, reviewed with patient at time of visit. Follow up and care instructions discussed and provided in  AVS. - I discussed the assessment and treatment plan with the patient. The patient was provided an opportunity to ask questions and all were answered. The patient agreed with the plan and demonstrated an understanding of the instructions.  I provided 20 minutes of non-face-to-face time during this encounter.  Delsa Grana, PA-C 07/12/22 2:28 PM

## 2022-07-14 DIAGNOSIS — Z419 Encounter for procedure for purposes other than remedying health state, unspecified: Secondary | ICD-10-CM | POA: Diagnosis not present

## 2022-07-18 ENCOUNTER — Encounter: Payer: Self-pay | Admitting: Family Medicine

## 2022-07-19 DIAGNOSIS — R32 Unspecified urinary incontinence: Secondary | ICD-10-CM | POA: Diagnosis not present

## 2022-07-20 ENCOUNTER — Ambulatory Visit: Payer: Medicaid Other | Admitting: Family Medicine

## 2022-07-20 ENCOUNTER — Encounter: Payer: Self-pay | Admitting: Family Medicine

## 2022-07-26 ENCOUNTER — Encounter: Payer: Self-pay | Admitting: Family Medicine

## 2022-07-26 ENCOUNTER — Ambulatory Visit (INDEPENDENT_AMBULATORY_CARE_PROVIDER_SITE_OTHER): Payer: Medicaid Other | Admitting: Family Medicine

## 2022-07-26 VITALS — BP 130/74 | HR 88 | Temp 97.9°F | Resp 16 | Ht 69.0 in | Wt 352.0 lb

## 2022-07-26 DIAGNOSIS — R197 Diarrhea, unspecified: Secondary | ICD-10-CM

## 2022-07-26 DIAGNOSIS — R110 Nausea: Secondary | ICD-10-CM | POA: Diagnosis not present

## 2022-07-26 MED ORDER — PANTOPRAZOLE SODIUM 20 MG PO TBEC: 20.0000 mg | DELAYED_RELEASE_TABLET | ORAL | 1 refills | Status: DC

## 2022-07-26 MED ORDER — ONDANSETRON HCL 4 MG PO TABS: 4.0000 mg | ORAL_TABLET | Freq: Three times a day (TID) | ORAL | 0 refills | Status: DC | PRN

## 2022-07-26 NOTE — Progress Notes (Unsigned)
Patient ID: Elizabeth Hoffman, female    DOB: 1970/06/29, 52 y.o.   MRN: [id number]  PCP: Delsa Grana, PA-C  Chief Complaint  Patient presents with   Diarrhea    Sx remain the same as previous visit, pt states now has loss appetite   Follow-up    Subjective:   Elizabeth Hoffman is a 52 y.o. female, presents to clinic with CC of the following:  HPI  Diarrhea x 3 weeks ever since having covid and taking paxlovid Only small amount of improvement Lost some weight, no fever, sweats, mild cramping and nausea with diarrhea, but no abd pain   Patient Active Problem List   Diagnosis Date Noted   OAB (overactive bladder) 05/19/2022   Current mild episode of major depressive disorder, unspecified whether recurrent ([place]) 05/19/2022   Uncontrolled type 2 diabetes mellitus with hyperglycemia, without long-term current use of insulin ([place]) 10/01/2020   Situational stress 09/28/2020   Cubital tunnel syndrome 11/11/2018   Cervical radiculopathy 05/23/2018   OSA on CPAP 11/03/2016   History of cervical cancer 02/02/2016   Hypercoagulable state ([place]) 02/02/2016   Abnormal uterine bleeding (AUB) 10/02/2015   Obesity, morbid, BMI 50 or higher ([place]) 07/05/2015   Malignant neoplasm of upper-outer quadrant of left breast in female, estrogen receptor positive ([place]) 02/22/2015   Mixed hyperlipidemia 02/08/2015   GERD without esophagitis 02/03/2015   Primary hypertension 02/03/2015   De Quervain's tenosynovitis, bilateral 02/03/2015   Numerous moles 02/03/2015   History of loop electrosurgical excision procedure (LEEP) of cervix 02/03/2015      Current Outpatient Medications:    famotidine (PEPCID) 20 MG tablet, Take 1 tablet (20 mg total) by mouth 2 (two) times daily., Disp: 60 tablet, Rfl: 0   fesoterodine (TOVIAZ) 4 MG TB24 tablet, Take 1 tablet (4 mg total) by mouth daily., Disp: 90 tablet, Rfl: 3   fluticasone (FLONASE) 50 MCG/ACT nasal spray, Place 2 sprays into both nostrils daily.  Can use twice daily for the first 3 days, then return to once daily, Disp: 9.9 mL, Rfl: 1   lisinopril-hydrochlorothiazide (ZESTORETIC) 10-12.5 MG tablet, TAKE 1 TABLET BY MOUTH DAILY., Disp: 90 tablet, Rfl: 0   loperamide (IMODIUM) 2 MG capsule, Take 1 capsule (2 mg total) by mouth 4 (four) times daily as needed for diarrhea or loose stools., Disp: 24 capsule, Rfl: 0   meloxicam (MOBIC) 15 MG tablet, Take 1 tablet (15 mg total) by mouth daily., Disp: 90 tablet, Rfl: 1   metFORMIN (GLUCOPHAGE-XR) 500 MG 24 hr tablet, TAKE 3 TABLETS BY MOUTH DAILY WITH BREAKFAST, Disp: 270 tablet, Rfl: 0   mirabegron ER (MYRBETRIQ) 50 MG TB24 tablet, Take 1 tablet (50 mg total) by mouth daily., Disp: 90 tablet, Rfl: 3   omeprazole (PRILOSEC) 20 MG capsule, Take 20 mg by mouth daily., Disp: , Rfl:    OZEMPIC, 0.25 OR 0.5 MG/DOSE, 2 MG/3ML SOPN, INJECT 0.'25MG'$  INTO THE SKIN ONCE A WEEK FOR 14 DAYS AND THEN INJECT 0.'5MG'$  ONCE AWEEK AS DIRECTED, Disp: 3 mL, Rfl: 1   rosuvastatin (CRESTOR) 5 MG tablet, Take 1 tablet (5 mg total) by mouth daily., Disp: 90 tablet, Rfl: 3   sertraline (ZOLOFT) 100 MG tablet, Take 1 tablet (100 mg total) by mouth daily., Disp: 90 tablet, Rfl: 0  Current Facility-Administered Medications:    betamethasone acetate-betamethasone sodium phosphate (CELESTONE) injection 3 mg, 3 mg, Intra-articular, Once, Evans, Dorathy Daft, DPM   No Known Allergies   Social History   Tobacco  Use   Smoking status: Never   Smokeless tobacco: Never  Vaping Use   Vaping Use: Never used  Substance Use Topics   Alcohol use: No    Alcohol/week: 0.0 standard drinks of alcohol   Drug use: No      Chart Review Today: I personally reviewed active problem list, medication list, allergies, family history, social history, health maintenance, notes from last encounter, lab results, imaging with the patient/caregiver today.   Review of Systems  Constitutional: Negative.   HENT: Negative.    Eyes: Negative.    Respiratory: Negative.    Cardiovascular: Negative.   Gastrointestinal: Negative.   Endocrine: Negative.   Genitourinary: Negative.   Musculoskeletal: Negative.   Skin: Negative.   Allergic/Immunologic: Negative.   Neurological: Negative.   Hematological: Negative.   Psychiatric/Behavioral: Negative.    All other systems reviewed and are negative.      Objective:   Vitals:   07/26/22 1336  BP: 130/74  Pulse: 88  Resp: 16  Temp: 97.9 F (36.6 C)  TempSrc: Oral  SpO2: 97%  Weight: (!) 352 lb (159.7 kg)  Height: '5\' 9"'$  (1.753 m)    Body mass index is 51.98 kg/m.  Physical Exam Vitals and nursing note reviewed.  Constitutional:      General: She is not in acute distress.    Appearance: Normal appearance. She is well-developed. She is obese. She is not ill-appearing or diaphoretic.  HENT:     Head: Normocephalic and atraumatic.     Right Ear: External ear normal.     Left Ear: External ear normal.     Nose: Nose normal.     Mouth/Throat:     Mouth: Mucous membranes are moist.     Pharynx: No oropharyngeal exudate or posterior oropharyngeal erythema.  Eyes:     General: No scleral icterus.       Right eye: No discharge.        Left eye: No discharge.     Conjunctiva/sclera: Conjunctivae normal.  Neck:     Trachea: No tracheal deviation.  Cardiovascular:     Rate and Rhythm: Normal rate and regular rhythm.     Pulses: Normal pulses.     Heart sounds: Normal heart sounds.  Pulmonary:     Effort: Pulmonary effort is normal. No respiratory distress.     Breath sounds: Normal breath sounds. No stridor.  Abdominal:     General: Bowel sounds are normal. There is no distension.     Palpations: Abdomen is soft.     Tenderness: There is no abdominal tenderness. There is no right CVA tenderness, left CVA tenderness, guarding or rebound.     Comments: Obese, exam limited somewhat by body habitus  Musculoskeletal:        General: Normal range of motion.  Skin:     General: Skin is warm and dry.     Findings: No rash.  Neurological:     Mental Status: She is alert.     Motor: No abnormal muscle tone.     Coordination: Coordination normal.  Psychiatric:        Mood and Affect: Mood normal.        Behavior: Behavior normal.      Results for orders placed or performed in visit on 07/05/22  POCT Influenza A/B  Result Value Ref Range   Influenza A, POC Negative Negative   Influenza B, POC Negative Negative       Assessment & Plan:  1. Diarrhea, unspecified type Profuse, watery to loose, occurs quickly after eating onset 3-4 weeks ago after having covid and taking paxlovid No recent fever, sweats, chills Abd is nontender, no vomiting, but she has lost some weight No blood or melena in stool - for the past few weeks we waited to see if it would get better after acute illness and after d/c of paxlovid, she has noticed no improvement with diarrhea and its interfering with life/work.  Will do labs and stool studies today - she appears well hydrated on exam If work up is negative and there is nothing found to tx, then we will get her to GI - CBC with Differential/Platelet - COMPLETE METABOLIC PANEL WITH GFR - Urinalysis, Routine w reflex microscopic - Lipase - Hepatitis Panel (REFL) - GI pathogen panel by PCR, stool - C. difficile GDH and Toxin A/B  2. Nausea Some loss of appetite with nausea  - CBC with Differential/Platelet - COMPLETE METABOLIC PANEL WITH GFR - Urinalysis, Routine w reflex microscopic - Lipase - Hepatitis Panel (REFL) - GI pathogen panel by PCR, stool - C. difficile GDH and Toxin A/B  zofran and protonix sent in for pt to try    Delsa Grana, PA-C 07/26/22 2:07 PM

## 2022-07-27 ENCOUNTER — Encounter: Payer: Self-pay | Admitting: Family Medicine

## 2022-07-27 DIAGNOSIS — R110 Nausea: Secondary | ICD-10-CM | POA: Diagnosis not present

## 2022-07-27 DIAGNOSIS — R197 Diarrhea, unspecified: Secondary | ICD-10-CM

## 2022-07-27 MED ORDER — METRONIDAZOLE 500 MG PO TABS: 500.0000 mg | ORAL_TABLET | Freq: Three times a day (TID) | ORAL | 0 refills | Status: AC

## 2022-07-27 MED ORDER — CIPROFLOXACIN HCL 500 MG PO TABS: 500.0000 mg | ORAL_TABLET | Freq: Two times a day (BID) | ORAL | 0 refills | Status: AC

## 2022-07-28 ENCOUNTER — Telehealth: Payer: Self-pay | Admitting: Family Medicine

## 2022-07-28 ENCOUNTER — Encounter: Payer: Self-pay | Admitting: Family Medicine

## 2022-07-28 NOTE — Telephone Encounter (Unsigned)
Copied from [place] [id number]. Topic: General - Other >> Jul 28, 2022  2:27 PM Chapman Fitch wrote: Reason for CRM: Pt is very concerned about her lab results and would like to speak with a nurse to know if what she is seeing on her results are accurate / please advise asap / pt would like to know before the weekend

## 2022-07-29 ENCOUNTER — Other Ambulatory Visit: Payer: Self-pay | Admitting: Family Medicine

## 2022-07-29 ENCOUNTER — Other Ambulatory Visit: Payer: Self-pay | Admitting: Podiatry

## 2022-07-29 LAB — CBC WITH DIFFERENTIAL/PLATELET
Absolute Monocytes: 619 cells/uL (ref 200–950)
Basophils Absolute: 20 cells/uL (ref 0–200)
Basophils Relative: 0.3 %
Eosinophils Absolute: 190 cells/uL (ref 15–500)
Eosinophils Relative: 2.8 %
HCT: 35 % (ref 35.0–45.0)
Hemoglobin: 11.3 g/dL — ABNORMAL LOW (ref 11.7–15.5)
Lymphs Abs: 2169 cells/uL (ref 850–3900)
MCH: 25.9 pg — ABNORMAL LOW (ref 27.0–33.0)
MCHC: 32.3 g/dL (ref 32.0–36.0)
MCV: 80.3 fL (ref 80.0–100.0)
MPV: 11.2 fL (ref 7.5–12.5)
Monocytes Relative: 9.1 %
Neutro Abs: 3801 cells/uL (ref 1500–7800)
Neutrophils Relative %: 55.9 %
Platelets: 297 10*3/uL (ref 140–400)
RBC: 4.36 10*6/uL (ref 3.80–5.10)
RDW: 14.1 % (ref 11.0–15.0)
Total Lymphocyte: 31.9 %
WBC: 6.8 10*3/uL (ref 3.8–10.8)

## 2022-07-29 LAB — HEPATITIS PANEL(REFL)
HEPATITIS C ANTIBODY REFILL$(REFL): REACTIVE — AB
Hep B Core Total Ab: NONREACTIVE
Hep B S Ab: REACTIVE — AB
Hepatitis A AB,Total: REACTIVE — AB
Hepatitis B Surface Ag: NONREACTIVE

## 2022-07-29 LAB — URINALYSIS, ROUTINE W REFLEX MICROSCOPIC
Bilirubin Urine: NEGATIVE
Glucose, UA: NEGATIVE
Hgb urine dipstick: NEGATIVE
Ketones, ur: NEGATIVE
Leukocytes,Ua: NEGATIVE
Nitrite: NEGATIVE
RBC / HPF: NONE SEEN /HPF (ref 0–2)
Specific Gravity, Urine: 1.024 (ref 1.001–1.035)
pH: 5.5 (ref 5.0–8.0)

## 2022-07-29 LAB — HCV RNA,QUANTITATIVE REAL TIME PCR
HCV Quantitative Log: <1.18 Log IU/mL
HCV RNA, PCR, QN: <15 IU/mL

## 2022-07-29 LAB — COMPLETE METABOLIC PANEL WITH GFR
AG Ratio: 1.3 (calc) (ref 1.0–2.5)
ALT: 18 U/L (ref 6–29)
AST: 15 U/L (ref 10–35)
Albumin: 3.8 g/dL (ref 3.6–5.1)
Alkaline phosphatase (APISO): 61 U/L (ref 37–153)
BUN/Creatinine Ratio: 14 (calc) (ref 6–22)
BUN: 15 mg/dL (ref 7–25)
CO2: 27 mmol/L (ref 20–32)
Calcium: 8.6 mg/dL (ref 8.6–10.4)
Chloride: 107 mmol/L (ref 98–110)
Creat: 1.05 mg/dL — ABNORMAL HIGH (ref 0.50–1.03)
Globulin: 3 g/dL (calc) (ref 1.9–3.7)
Glucose, Bld: 107 mg/dL — ABNORMAL HIGH (ref 65–99)
Potassium: 3.7 mmol/L (ref 3.5–5.3)
Sodium: 145 mmol/L (ref 135–146)
Total Bilirubin: 0.3 mg/dL (ref 0.2–1.2)
Total Protein: 6.8 g/dL (ref 6.1–8.1)
eGFR: 64 mL/min/{1.73_m2} (ref >=60)

## 2022-07-29 LAB — REFLEX TIQ

## 2022-07-29 LAB — LIPASE: Lipase: 36 U/L (ref 7–60)

## 2022-07-29 LAB — MICROSCOPIC MESSAGE

## 2022-07-31 LAB — C. DIFFICILE GDH AND TOXIN A/B
GDH ANTIGEN: NOT DETECTED
MICRO NUMBER:: 14694621
SPECIMEN QUALITY:: ADEQUATE
TOXIN A AND B: NOT DETECTED

## 2022-07-31 LAB — GASTROINTESTINAL PATHOGEN PNL

## 2022-07-31 NOTE — Telephone Encounter (Signed)
Called and left voicemail with results. I let her know any other pending results will come from [facility]

## 2022-08-02 ENCOUNTER — Encounter: Payer: Self-pay | Admitting: Family Medicine

## 2022-08-04 ENCOUNTER — Other Ambulatory Visit: Payer: Self-pay | Admitting: Family Medicine

## 2022-08-04 DIAGNOSIS — B159 Hepatitis A without hepatic coma: Secondary | ICD-10-CM

## 2022-08-04 DIAGNOSIS — R197 Diarrhea, unspecified: Secondary | ICD-10-CM

## 2022-08-04 NOTE — Telephone Encounter (Signed)
If she just restarted amlodipine 10 mg that can sometimes cause LE edema

## 2022-08-07 ENCOUNTER — Encounter: Payer: Self-pay | Admitting: Family Medicine

## 2022-08-14 DIAGNOSIS — Z419 Encounter for procedure for purposes other than remedying health state, unspecified: Secondary | ICD-10-CM | POA: Diagnosis not present

## 2022-08-23 DIAGNOSIS — R32 Unspecified urinary incontinence: Secondary | ICD-10-CM | POA: Diagnosis not present

## 2022-09-05 ENCOUNTER — Other Ambulatory Visit: Payer: Self-pay | Admitting: Family Medicine

## 2022-09-05 DIAGNOSIS — E1165 Type 2 diabetes mellitus with hyperglycemia: Secondary | ICD-10-CM

## 2022-09-13 DIAGNOSIS — Z419 Encounter for procedure for purposes other than remedying health state, unspecified: Secondary | ICD-10-CM | POA: Diagnosis not present

## 2022-10-01 ENCOUNTER — Other Ambulatory Visit: Payer: Self-pay | Admitting: Family Medicine

## 2022-10-01 DIAGNOSIS — F320 Major depressive disorder, single episode, mild: Secondary | ICD-10-CM

## 2022-10-02 ENCOUNTER — Other Ambulatory Visit: Payer: Self-pay | Admitting: Family Medicine

## 2022-10-02 ENCOUNTER — Encounter: Payer: Self-pay | Admitting: Family Medicine

## 2022-10-02 ENCOUNTER — Other Ambulatory Visit: Payer: Self-pay | Admitting: Physician Assistant

## 2022-10-02 DIAGNOSIS — F320 Major depressive disorder, single episode, mild: Secondary | ICD-10-CM

## 2022-10-02 MED ORDER — SERTRALINE HCL 100 MG PO TABS: 100.0000 mg | ORAL_TABLET | Freq: Every day | ORAL | 0 refills | Status: DC

## 2022-10-02 NOTE — Telephone Encounter (Signed)
Pt needs an appt for refills.  

## 2022-10-02 NOTE — Telephone Encounter (Signed)
MyChart message sent to pt for appt.

## 2022-10-03 NOTE — Telephone Encounter (Signed)
Requested medication (s) are due for refill today: yes   Requested medication (s) are on the active medication list: yes   Last refill:  06/01/22 #90 0 refills  Future visit scheduled: yes in 1 week   Notes to clinic:  last ordered by E. Mecum, PA. 06/01/22 do you want to refill Rx?     Requested Prescriptions  Pending Prescriptions Disp Refills   lisinopril-hydrochlorothiazide (ZESTORETIC) 10-12.5 MG tablet [Pharmacy Med Name: LISINOPRIL-HCTZ 10-12.5 MG TAB] 90 tablet 0    Sig: TAKE 1 TABLET BY MOUTH DAILY.     Cardiovascular:  ACEI + Diuretic Combos Failed - 10/02/2022 11:33 AM      Failed - Cr in normal range and within 180 days    Creatinine  Date Value Ref Range Status  02/24/2015 0.9 0.6 - 1.1 mg/dL Final   Creat  Date Value Ref Range Status  07/26/2022 1.05 (H) 0.50 - 1.03 mg/dL Final   Creatinine, Urine  Date Value Ref Range Status  05/11/2022 174 20 - 275 mg/dL Final         Passed - Na in normal range and within 180 days    Sodium  Date Value Ref Range Status  07/26/2022 145 135 - 146 mmol/L Final  02/24/2015 141 136 - 145 mEq/L Final         Passed - K in normal range and within 180 days    Potassium  Date Value Ref Range Status  07/26/2022 3.7 3.5 - 5.3 mmol/L Final  02/24/2015 3.7 3.5 - 5.1 mEq/L Final         Passed - eGFR is 30 or above and within 180 days    GFR, Est African American  Date Value Ref Range Status  09/28/2020 77 > OR = 60 mL/min/1.64m2 Final   GFR, Est Non African American  Date Value Ref Range Status  09/28/2020 66 > OR = 60 mL/min/1.45m2 Final   eGFR  Date Value Ref Range Status  07/26/2022 64 > OR = 60 mL/min/1.6m2 Final         Passed - Patient is not pregnant      Passed - Last BP in normal range    BP Readings from Last 1 Encounters:  07/26/22 130/74         Passed - Valid encounter within last 6 months    Recent Outpatient Visits           2 months ago Diarrhea, unspecified type   [facility] Danelle Berry, PA-C   2 months ago Diarrhea, unspecified type   [facility] Danelle Berry, PA-C   3 months ago Febrile illness   [facility] Danelle Berry, PA-C   4 months ago Uncontrolled type 2 diabetes mellitus with hyperglycemia, without long-term current use of insulin [facility])   [place] [facility] Danelle Berry, PA-C   1 year ago Type 2 diabetes mellitus without complication, without long-term current use of insulin [facility])   [facility] Danelle Berry, PA-C       Future Appointments             In 1 week Danelle Berry, PA-C [facility]   In 8 months [place], [place] [facility]

## 2022-10-10 ENCOUNTER — Ambulatory Visit: Payer: Medicaid Other | Admitting: Family Medicine

## 2022-10-10 ENCOUNTER — Encounter: Payer: Self-pay | Admitting: Family Medicine

## 2022-10-10 VITALS — BP 128/82 | HR 95 | Temp 98.2°F | Resp 16 | Ht 69.0 in | Wt 349.4 lb

## 2022-10-10 DIAGNOSIS — Z7985 Long-term (current) use of injectable non-insulin antidiabetic drugs: Secondary | ICD-10-CM

## 2022-10-10 DIAGNOSIS — I10 Essential (primary) hypertension: Secondary | ICD-10-CM | POA: Diagnosis not present

## 2022-10-10 DIAGNOSIS — D649 Anemia, unspecified: Secondary | ICD-10-CM | POA: Diagnosis not present

## 2022-10-10 DIAGNOSIS — G4733 Obstructive sleep apnea (adult) (pediatric): Secondary | ICD-10-CM

## 2022-10-10 DIAGNOSIS — K219 Gastro-esophageal reflux disease without esophagitis: Secondary | ICD-10-CM

## 2022-10-10 DIAGNOSIS — E1165 Type 2 diabetes mellitus with hyperglycemia: Secondary | ICD-10-CM

## 2022-10-10 DIAGNOSIS — E782 Mixed hyperlipidemia: Secondary | ICD-10-CM | POA: Diagnosis not present

## 2022-10-10 DIAGNOSIS — F320 Major depressive disorder, single episode, mild: Secondary | ICD-10-CM | POA: Diagnosis not present

## 2022-10-10 DIAGNOSIS — Z1231 Encounter for screening mammogram for malignant neoplasm of breast: Secondary | ICD-10-CM

## 2022-10-10 DIAGNOSIS — Z7984 Long term (current) use of oral hypoglycemic drugs: Secondary | ICD-10-CM

## 2022-10-10 DIAGNOSIS — Z5181 Encounter for therapeutic drug level monitoring: Secondary | ICD-10-CM

## 2022-10-10 LAB — CBC WITH DIFFERENTIAL/PLATELET
Absolute Monocytes: 510 cells/uL (ref 200–950)
Eosinophils Relative: 6.6 %
MCH: 25.5 pg — ABNORMAL LOW (ref 27.0–33.0)
MPV: 10.8 fL (ref 7.5–12.5)
Neutro Abs: 5174 cells/uL (ref 1500–7800)
Neutrophils Relative %: 58.8 %
WBC: 8.8 10*3/uL (ref 3.8–10.8)

## 2022-10-10 MED ORDER — SERTRALINE HCL 100 MG PO TABS
100.0000 mg | ORAL_TABLET | Freq: Every day | ORAL | 3 refills | Status: DC
Start: 2022-10-10 — End: 2023-09-10

## 2022-10-10 MED ORDER — AMLODIPINE BESYLATE 10 MG PO TABS
10.0000 mg | ORAL_TABLET | Freq: Every day | ORAL | 1 refills | Status: DC
Start: 2022-10-10 — End: 2023-06-04

## 2022-10-10 MED ORDER — LISINOPRIL-HYDROCHLOROTHIAZIDE 10-12.5 MG PO TABS: 1.0000 | ORAL_TABLET | Freq: Every day | ORAL | 1 refills | Status: DC

## 2022-10-10 NOTE — Assessment & Plan Note (Signed)
Wt Readings from Last 5 Encounters:  10/10/22 (!) 349 lb 6.4 oz (158.5 kg)  07/26/22 (!) 352 lb (159.7 kg)  07/05/22 (!) 365 lb 6.4 oz (165.7 kg)  06/20/22 (!) 368 lb (166.9 kg)  05/11/22 (!) 365 lb (165.6 kg)   BMI Readings from Last 5 Encounters:  10/10/22 51.60 kg/m  07/26/22 51.98 kg/m  07/05/22 53.96 kg/m  06/20/22 54.34 kg/m  05/11/22 53.90 kg/m   Weight down a little, continue ozempic for DM, anticipate dose increase, she can do DM/nutritional consult, diet/lifestyle efforts encouraged Prior plan which is unchanged -  discussion on ozempic -no containdications, educated on diet/lifestyle efforts, can do consult with nutritionist  Multiple associated comorbidities including uncontrolled diabetes, hypertension, hyperlipidemia

## 2022-10-10 NOTE — Assessment & Plan Note (Signed)
BP at goal today, managed on lisinopril-HCTZ 10-12.5 and norvasc 10 mg There has been some med confusion and she also ran out of medications BP Readings from Last 3 Encounters:  10/10/22 128/82  07/26/22 130/74  07/05/22 (!) 150/82  We will want her to continue on ACEI due to DM She was previously on norvasc and HCTZ - we can titrate those doses down if BP is adequately controlled For now refill on 10-12.5 sent in

## 2022-10-10 NOTE — Assessment & Plan Note (Signed)
Stable, well controlled on rosuvastatin Lab Results  Component Value Date   CHOL 150 05/11/2022   HDL 51 05/11/2022   LDLCALC 80 05/11/2022   TRIG 104 05/11/2022   CHOLHDL 2.9 05/11/2022  Tolerating statin w/o SE or concerns

## 2022-10-10 NOTE — Assessment & Plan Note (Signed)
Sx remain well controlled with zoloft 100 mg daily She ran out of meds and has noted much worse sx and irritability She does have refills    10/10/2022   10:59 AM 07/26/2022    1:36 PM 07/12/2022    1:24 PM  Depression screen PHQ 2/9  Decreased Interest 2 0 0  Down, Depressed, Hopeless 2 0 0  PHQ - 2 Score 4 0 0  Altered sleeping 0 0 0  Tired, decreased energy 0 0 0  Change in appetite 0 0 0  Feeling bad or failure about yourself  0 0 0  Trouble concentrating 0 0 0  Moving slowly or fidgety/restless 0 0 0  Suicidal thoughts 0 0 0  PHQ-9 Score 4 0 0  Difficult doing work/chores Somewhat difficult Not difficult at all Not difficult at all

## 2022-10-10 NOTE — Assessment & Plan Note (Signed)
Uncontrolled on metformin XR 1500 mg daily - explained to pt she can do 1000 mg with one meal and 500 mg with another meal if GI SE, also on starting pen of ozempic - currently 0.50 mg dose - anticipate increasing to 1.0 once labs are reviewed  Denies: Polyuria, polydipsia, vision changes, neuropathy, hypoglycemia Recent pertinent labs: Lab Results  Component Value Date   HGBA1C 8.1 (H) 05/11/2022   HGBA1C 7.8 (H) 08/29/2021   HGBA1C 7.2 (H) 09/28/2020    Standard of care and health maintenance: Urine Microalbumin:  done/UTD Foot exam:  done today DM eye exam:  due - referral entered ACEI/ARB:  yes - lisinopril Statin:  yes - rosuvastatin

## 2022-10-10 NOTE — Assessment & Plan Note (Signed)
She reports sx are well controlled on omeprazole and she is not taking protonix and pepcid

## 2022-10-10 NOTE — Progress Notes (Signed)
Name: Elizabeth Hoffman   MRN: [id number]    DOB: 10/14/1970   Date:10/10/2022       Progress Note  Chief Complaint  Patient presents with   Follow-up   Hypertension   Hyperlipidemia   Diabetes     Subjective:   Elizabeth Hoffman is a 52 y.o. female, presents to clinic for routine f/up  Hypertension:  Currently managed on lisinopril-HCTZ 10-12.5 and norvasc 10 - wrong meds on chart (prior on 25 mg HCTZ which got refilled but her correct med did not get refilled - reviewed and discussed today) Pt reports good med compliance and denies any SE.   Blood pressure today is well controlled. BP Readings from Last 3 Encounters:  10/10/22 128/82  07/26/22 130/74  07/05/22 (!) 150/82   Pt denies CP, SOB, exertional sx, LE edema, palpitation, Ha's, visual disturbances, lightheadedness, hypotension, syncope.  Hyperlipidemia: Currently treated with crestor 10 mg, pt reports good med compliance Last Lipids: Lab Results  Component Value Date   CHOL 150 05/11/2022   HDL 51 05/11/2022   LDLCALC 80 05/11/2022   TRIG 104 05/11/2022   CHOLHDL 2.9 05/11/2022   - Denies: Chest pain, shortness of breath, myalgias, claudication  DM:   Pt managing DM with metformin XR 1500 mg daily and ozempic 0.50 Reports good med compliance Pt has no SE from meds. Denies: Polyuria, polydipsia, vision changes, neuropathy, hypoglycemia Recent pertinent labs: Lab Results  Component Value Date   HGBA1C 8.1 (H) 05/11/2022   HGBA1C 7.8 (H) 08/29/2021   HGBA1C 7.2 (H) 09/28/2020   Lab Results  Component Value Date   MICROALBUR 4.7 05/11/2022   LDLCALC 80 05/11/2022   CREATININE 1.05 (H) 07/26/2022   Standard of care and health maintenance: Urine Microalbumin:  done Foot exam:  due/done today DM eye exam:  due ACEI/ARB:   Statin:  yes  MDD/anxiety - zoloft - very irritable last week having run out of medications A refill was sent to the pharmacy on 5/20 but she did not get a call from them and  did not pick it up     10/10/2022   10:59 AM 07/26/2022    1:36 PM 07/12/2022    1:24 PM  Depression screen PHQ 2/9  Decreased Interest 2 0 0  Down, Depressed, Hopeless 2 0 0  PHQ - 2 Score 4 0 0  Altered sleeping 0 0 0  Tired, decreased energy 0 0 0  Change in appetite 0 0 0  Feeling bad or failure about yourself  0 0 0  Trouble concentrating 0 0 0  Moving slowly or fidgety/restless 0 0 0  Suicidal thoughts 0 0 0  PHQ-9 Score 4 0 0  Difficult doing work/chores Somewhat difficult Not difficult at all Not difficult at all    Diarrhea illness, finally feeling better No abd pain, no blood in stool Stool and blood tests both were not completed due to various issues (stool sample could not be processed, reflex testing could not be performed by lab)   OAB - per specialists - on two medications       Current Outpatient Medications:    amLODipine (NORVASC) 10 MG tablet, TAKE 1 TABLET BY MOUTH DAILY, Disp: 90 tablet, Rfl: 0   fesoterodine (TOVIAZ) 4 MG TB24 tablet, Take 1 tablet (4 mg total) by mouth daily., Disp: 90 tablet, Rfl: 3   fluticasone (FLONASE) 50 MCG/ACT nasal spray, Place 2 sprays into both nostrils daily. Can use twice daily for the  first 3 days, then return to once daily, Disp: 9.9 mL, Rfl: 1   hydrochlorothiazide (HYDRODIURIL) 25 MG tablet, TAKE 1 TABLET BY MOUTH ONCE DAILY, Disp: 30 tablet, Rfl: 0   lisinopril-hydrochlorothiazide (ZESTORETIC) 10-12.5 MG tablet, TAKE 1 TABLET BY MOUTH DAILY., Disp: 90 tablet, Rfl: 0   loperamide (IMODIUM) 2 MG capsule, Take 1 capsule (2 mg total) by mouth 4 (four) times daily as needed for diarrhea or loose stools., Disp: 24 capsule, Rfl: 0   meloxicam (MOBIC) 15 MG tablet, TAKE 1 TABLET BY MOUTH ONCE DAILY, Disp: 90 tablet, Rfl: 1   metFORMIN (GLUCOPHAGE-XR) 500 MG 24 hr tablet, TAKE 3 TABLETS BY MOUTH DAILY WITH BREAKFAST, Disp: 270 tablet, Rfl: 0   mirabegron ER (MYRBETRIQ) 50 MG TB24 tablet, Take 1 tablet (50 mg total) by mouth  daily., Disp: 90 tablet, Rfl: 3   OZEMPIC, 0.25 OR 0.5 MG/DOSE, 2 MG/3ML SOPN, INJECT 0.25MG  INTO THE SKIN ONCE A WEEK FOR 14 DAYS AND THEN INJECT 0.5MG  ONCE AWEEK AS DIRECTED, Disp: 3 mL, Rfl: 1   rosuvastatin (CRESTOR) 5 MG tablet, Take 1 tablet (5 mg total) by mouth daily., Disp: 90 tablet, Rfl: 3   sertraline (ZOLOFT) 100 MG tablet, Take 1 tablet (100 mg total) by mouth daily., Disp: 90 tablet, Rfl: 0   famotidine (PEPCID) 20 MG tablet, Take 1 tablet (20 mg total) by mouth 2 (two) times daily. (Patient not taking: Reported on 10/10/2022), Disp: 60 tablet, Rfl: 0   ondansetron (ZOFRAN) 4 MG tablet, Take 1 tablet (4 mg total) by mouth every 8 (eight) hours as needed for nausea or vomiting. (Patient not taking: Reported on 10/10/2022), Disp: 20 tablet, Rfl: 0   pantoprazole (PROTONIX) 20 MG tablet, Take 1 tablet (20 mg total) by mouth every morning. One hour before breakfast (Patient not taking: Reported on 10/10/2022), Disp: 30 tablet, Rfl: 1  Current Facility-Administered Medications:    betamethasone acetate-betamethasone sodium phosphate (CELESTONE) injection 3 mg, 3 mg, Intra-articular, Once, Felecia Shelling, DPM  Patient Active Problem List   Diagnosis Date Noted   OAB (overactive bladder) 05/19/2022   Current mild episode of major depressive disorder, unspecified whether recurrent (HCC) 05/19/2022   Uncontrolled type 2 diabetes mellitus with hyperglycemia, without long-term current use of insulin (HCC) 10/01/2020   Situational stress 09/28/2020   Cubital tunnel syndrome 11/11/2018   Cervical radiculopathy 05/23/2018   OSA on CPAP 11/03/2016   History of cervical cancer 02/02/2016   Hypercoagulable state (HCC) 02/02/2016   Abnormal uterine bleeding (AUB) 10/02/2015   Obesity, morbid, BMI 50 or higher (HCC) 07/05/2015   Malignant neoplasm of upper-outer quadrant of left breast in female, estrogen receptor positive (HCC) 02/22/2015   Mixed hyperlipidemia 02/08/2015   GERD without  esophagitis 02/03/2015   Primary hypertension 02/03/2015   De Quervain's tenosynovitis, bilateral 02/03/2015   Numerous moles 02/03/2015   History of loop electrosurgical excision procedure (LEEP) of cervix 02/03/2015    Past Surgical History:  Procedure Laterality Date   ABDOMINAL HYSTERECTOMY  06/15/2017   BREAST EXCISIONAL BIOPSY Left 03/18/2015   breast ca +   BREAST LUMPECTOMY WITH RADIOACTIVE SEED LOCALIZATION Left 03/18/2015   Procedure: BREAST LUMPECTOMY WITH RADIOACTIVE SEED LOCALIZATION;  Surgeon: Harriette Bouillon, MD;  Location: [facility];  Service: General;  Laterality: Left;   CARPAL TUNNEL RELEASE Bilateral 04/16/2020   CERVICAL BIOPSY  W/ LOOP ELECTRODE EXCISION     CHOLECYSTECTOMY     HERNIA REPAIR     MASTECTOMY, PARTIAL Left 02/15/2015  NASAL SEPTUM SURGERY     TONSILLECTOMY AND ADENOIDECTOMY     UVULECTOMY      Family History  Adopted: Yes  Problem Relation Age of Onset   Breast cancer Neg Hx     Social History   Tobacco Use   Smoking status: Never   Smokeless tobacco: Never  Vaping Use   Vaping Use: Never used  Substance Use Topics   Alcohol use: No    Alcohol/week: 0.0 standard drinks of alcohol   Drug use: No     No Known Allergies  Health Maintenance  Topic Date Due   OPHTHALMOLOGY EXAM  04/08/2020   MAMMOGRAM  03/05/2021   FOOT EXAM  08/30/2022   COVID-19 Vaccine (3 - Pfizer risk series) 10/26/2022 (Originally 09/30/2019)   Zoster Vaccines- Shingrix (1 of 2) 01/10/2023 (Originally 07/06/1990)   HEMOGLOBIN A1C  11/10/2022   INFLUENZA VACCINE  12/14/2022   Diabetic kidney evaluation - Urine ACR  05/12/2023   Diabetic kidney evaluation - eGFR measurement  07/26/2023   Fecal DNA (Cologuard)  11/03/2023   DTaP/Tdap/Td (2 - Td or Tdap) 09/05/2025   Hepatitis C Screening  Completed   HIV Screening  Completed   HPV VACCINES  Aged Out   PAP SMEAR-Modifier  Discontinued    Chart Review Today: I personally reviewed active  problem list, medication list, allergies, family history, social history, health maintenance, notes from last encounter, lab results, imaging with the patient/caregiver today.   Review of Systems  Constitutional: Negative.   HENT: Negative.    Eyes: Negative.   Respiratory: Negative.    Cardiovascular: Negative.   Gastrointestinal: Negative.   Endocrine: Negative.   Genitourinary: Negative.   Musculoskeletal: Negative.   Skin: Negative.   Allergic/Immunologic: Negative.   Neurological: Negative.   Hematological: Negative.   Psychiatric/Behavioral: Negative.    All other systems reviewed and are negative.    Objective:   Vitals:   10/10/22 1101  BP: 128/82  Pulse: 95  Resp: 16  Temp: 98.2 F (36.8 C)  TempSrc: Oral  SpO2: 95%  Weight: (!) 349 lb 6.4 oz (158.5 kg)  Height: 5\' 9"  (1.753 m)    Body mass index is 51.6 kg/m.  Physical Exam Vitals and nursing note reviewed.  Constitutional:      General: She is not in acute distress.    Appearance: Normal appearance. She is well-developed and well-groomed. She is obese. She is not ill-appearing, toxic-appearing or diaphoretic.  HENT:     Head: Normocephalic and atraumatic.     Nose: Nose normal.  Eyes:     General:        Right eye: No discharge.        Left eye: No discharge.     Conjunctiva/sclera: Conjunctivae normal.  Neck:     Trachea: No tracheal deviation.  Cardiovascular:     Rate and Rhythm: Normal rate and regular rhythm.     Pulses: Normal pulses.     Heart sounds: Normal heart sounds. No murmur heard.    No friction rub. No gallop.  Pulmonary:     Effort: Pulmonary effort is normal. No respiratory distress.     Breath sounds: Normal breath sounds. No stridor.  Musculoskeletal:     Right lower leg: No edema.     Left lower leg: No edema.  Skin:    General: Skin is warm and dry.     Findings: No rash.  Neurological:     Mental Status: She is alert.  Motor: No abnormal muscle tone.      Coordination: Coordination normal.  Psychiatric:        Mood and Affect: Mood normal.        Behavior: Behavior normal. Behavior is cooperative.         Assessment & Plan:   Problem List Items Addressed This Visit       Cardiovascular and Mediastinum   Primary hypertension    BP at goal today, managed on lisinopril-HCTZ 10-12.5 and norvasc 10 mg There has been some med confusion and she also ran out of medications BP Readings from Last 3 Encounters:  10/10/22 128/82  07/26/22 130/74  07/05/22 (!) 150/82  We will want her to continue on ACEI due to DM She was previously on norvasc and HCTZ - we can titrate those doses down if BP is adequately controlled For now refill on 10-12.5 sent in       Relevant Medications   amLODipine (NORVASC) 10 MG tablet   lisinopril-hydrochlorothiazide (ZESTORETIC) 10-12.5 MG tablet   Other Relevant Orders   COMPLETE METABOLIC PANEL WITH GFR     Digestive   GERD without esophagitis    She reports sx are well controlled on omeprazole and she is not taking protonix and pepcid        Endocrine   Uncontrolled type 2 diabetes mellitus with hyperglycemia, without long-term current use of insulin (HCC) - Primary    Uncontrolled on metformin XR 1500 mg daily - explained to pt she can do 1000 mg with one meal and 500 mg with another meal if GI SE, also on starting pen of ozempic - currently 0.50 mg dose - anticipate increasing to 1.0 once labs are reviewed  Denies: Polyuria, polydipsia, vision changes, neuropathy, hypoglycemia Recent pertinent labs: Lab Results  Component Value Date   HGBA1C 8.1 (H) 05/11/2022   HGBA1C 7.8 (H) 08/29/2021   HGBA1C 7.2 (H) 09/28/2020    Standard of care and health maintenance: Urine Microalbumin:  done/UTD Foot exam:  done today DM eye exam:  due - referral entered ACEI/ARB:  yes - lisinopril Statin:  yes - rosuvastatin        Relevant Medications   lisinopril-hydrochlorothiazide (ZESTORETIC) 10-12.5  MG tablet   Other Relevant Orders   Ambulatory referral to Ophthalmology   Hemoglobin A1c   COMPLETE METABOLIC PANEL WITH GFR     Other   Mixed hyperlipidemia    Stable, well controlled on rosuvastatin Lab Results  Component Value Date   CHOL 150 05/11/2022   HDL 51 05/11/2022   LDLCALC 80 05/11/2022   TRIG 104 05/11/2022   CHOLHDL 2.9 05/11/2022  Tolerating statin w/o SE or concerns       Relevant Medications   amLODipine (NORVASC) 10 MG tablet   lisinopril-hydrochlorothiazide (ZESTORETIC) 10-12.5 MG tablet   Other Relevant Orders   COMPLETE METABOLIC PANEL WITH GFR   Obesity, morbid, BMI 50 or higher (HCC)    Wt Readings from Last 5 Encounters:  10/10/22 (!) 349 lb 6.4 oz (158.5 kg)  07/26/22 (!) 352 lb (159.7 kg)  07/05/22 (!) 365 lb 6.4 oz (165.7 kg)  06/20/22 (!) 368 lb (166.9 kg)  05/11/22 (!) 365 lb (165.6 kg)   BMI Readings from Last 5 Encounters:  10/10/22 51.60 kg/m  07/26/22 51.98 kg/m  07/05/22 53.96 kg/m  06/20/22 54.34 kg/m  05/11/22 53.90 kg/m  Weight down a little, continue ozempic for DM, anticipate dose increase, she can do DM/nutritional consult, diet/lifestyle efforts encouraged Prior plan  which is unchanged -  discussion on ozempic -no containdications, educated on diet/lifestyle efforts, can do consult with nutritionist  Multiple associated comorbidities including uncontrolled diabetes, hypertension, hyperlipidemia      Current mild episode of major depressive disorder, unspecified whether recurrent (HCC)    Sx remain well controlled with zoloft 100 mg daily She ran out of meds and has noted much worse sx and irritability She does have refills    10/10/2022   10:59 AM 07/26/2022    1:36 PM 07/12/2022    1:24 PM  Depression screen PHQ 2/9  Decreased Interest 2 0 0  Down, Depressed, Hopeless 2 0 0  PHQ - 2 Score 4 0 0  Altered sleeping 0 0 0  Tired, decreased energy 0 0 0  Change in appetite 0 0 0  Feeling bad or failure about  yourself  0 0 0  Trouble concentrating 0 0 0  Moving slowly or fidgety/restless 0 0 0  Suicidal thoughts 0 0 0  PHQ-9 Score 4 0 0  Difficult doing work/chores Somewhat difficult Not difficult at all Not difficult at all        Relevant Medications   sertraline (ZOLOFT) 100 MG tablet   Other Visit Diagnoses     Encounter for screening mammogram for malignant neoplasm of breast       Relevant Orders   MM 3D SCREENING MAMMOGRAM BILATERAL BREAST   Encounter for medication monitoring       Relevant Orders   Hemoglobin A1c   COMPLETE METABOLIC PANEL WITH GFR   CBC with Differential/Platelet   Anemia, unspecified type       last h/h decreased a little in setting of acute GI illness, will recheck CBC   Relevant Orders   CBC with Differential/Platelet        Return in about 3 months (around 01/10/2023) for dm f/up in office with labs.   Danelle Berry, PA-C 10/10/22 11:08 AM

## 2022-10-11 ENCOUNTER — Other Ambulatory Visit: Payer: Self-pay | Admitting: Family Medicine

## 2022-10-11 DIAGNOSIS — E1165 Type 2 diabetes mellitus with hyperglycemia: Secondary | ICD-10-CM

## 2022-10-11 LAB — HEMOGLOBIN A1C
Hgb A1c MFr Bld: 7.5 % of total Hgb — ABNORMAL HIGH (ref <5.7)
Mean Plasma Glucose: 169 mg/dL
eAG (mmol/L): 9.3 mmol/L

## 2022-10-11 LAB — COMPLETE METABOLIC PANEL WITH GFR
AG Ratio: 1.5 (calc) (ref 1.0–2.5)
ALT: 28 U/L (ref 6–29)
AST: 21 U/L (ref 10–35)
Albumin: 4.5 g/dL (ref 3.6–5.1)
Alkaline phosphatase (APISO): 71 U/L (ref 37–153)
BUN: 19 mg/dL (ref 7–25)
CO2: 26 mmol/L (ref 20–32)
Calcium: 9.9 mg/dL (ref 8.6–10.4)
Chloride: 104 mmol/L (ref 98–110)
Creat: 0.96 mg/dL (ref 0.50–1.03)
Globulin: 3.1 g/dL (calc) (ref 1.9–3.7)
Glucose, Bld: 140 mg/dL — ABNORMAL HIGH (ref 65–99)
Potassium: 4.8 mmol/L (ref 3.5–5.3)
Sodium: 142 mmol/L (ref 135–146)
Total Bilirubin: 0.5 mg/dL (ref 0.2–1.2)
Total Protein: 7.6 g/dL (ref 6.1–8.1)
eGFR: 72 mL/min/{1.73_m2} (ref >=60)

## 2022-10-11 LAB — CBC WITH DIFFERENTIAL/PLATELET
Basophils Absolute: 44 cells/uL (ref 0–200)
Basophils Relative: 0.5 %
Eosinophils Absolute: 581 cells/uL — ABNORMAL HIGH (ref 15–500)
HCT: 42.3 % (ref 35.0–45.0)
Hemoglobin: 13.2 g/dL (ref 11.7–15.5)
Lymphs Abs: 2490 cells/uL (ref 850–3900)
MCHC: 31.2 g/dL — ABNORMAL LOW (ref 32.0–36.0)
MCV: 81.7 fL (ref 80.0–100.0)
Monocytes Relative: 5.8 %
Platelets: 280 10*3/uL (ref 140–400)
RBC: 5.18 10*6/uL — ABNORMAL HIGH (ref 3.80–5.10)
RDW: 14.6 % (ref 11.0–15.0)
Total Lymphocyte: 28.3 %

## 2022-10-11 MED ORDER — METFORMIN HCL ER 500 MG PO TB24
ORAL_TABLET | ORAL | 1 refills | Status: DC
Start: 2022-10-11 — End: 2023-06-04

## 2022-10-11 MED ORDER — SEMAGLUTIDE (1 MG/DOSE) 4 MG/3ML ~~LOC~~ SOPN
1.0000 mg | PEN_INJECTOR | SUBCUTANEOUS | 0 refills | Status: DC
Start: 2022-10-11 — End: 2022-12-26

## 2022-10-13 ENCOUNTER — Encounter: Payer: Self-pay | Admitting: Family Medicine

## 2022-10-14 DIAGNOSIS — Z419 Encounter for procedure for purposes other than remedying health state, unspecified: Secondary | ICD-10-CM | POA: Diagnosis not present

## 2022-11-13 DIAGNOSIS — Z419 Encounter for procedure for purposes other than remedying health state, unspecified: Secondary | ICD-10-CM | POA: Diagnosis not present

## 2022-11-22 DIAGNOSIS — R32 Unspecified urinary incontinence: Secondary | ICD-10-CM | POA: Diagnosis not present

## 2022-12-14 DIAGNOSIS — Z419 Encounter for procedure for purposes other than remedying health state, unspecified: Secondary | ICD-10-CM | POA: Diagnosis not present

## 2022-12-25 ENCOUNTER — Other Ambulatory Visit: Payer: Self-pay | Admitting: Family Medicine

## 2022-12-26 ENCOUNTER — Other Ambulatory Visit: Payer: Self-pay | Admitting: Family Medicine

## 2022-12-26 DIAGNOSIS — E1165 Type 2 diabetes mellitus with hyperglycemia: Secondary | ICD-10-CM

## 2022-12-26 MED ORDER — SEMAGLUTIDE (1 MG/DOSE) 4 MG/3ML ~~LOC~~ SOPN
1.0000 mg | PEN_INJECTOR | SUBCUTANEOUS | 0 refills | Status: DC
Start: 2022-12-26 — End: 2023-01-19

## 2023-01-10 ENCOUNTER — Ambulatory Visit: Payer: Medicaid Other | Admitting: Family Medicine

## 2023-01-10 DIAGNOSIS — E1165 Type 2 diabetes mellitus with hyperglycemia: Secondary | ICD-10-CM

## 2023-01-14 DIAGNOSIS — Z419 Encounter for procedure for purposes other than remedying health state, unspecified: Secondary | ICD-10-CM | POA: Diagnosis not present

## 2023-01-16 ENCOUNTER — Other Ambulatory Visit: Payer: Self-pay | Admitting: Family Medicine

## 2023-01-19 ENCOUNTER — Ambulatory Visit: Payer: Medicaid Other | Admitting: Family Medicine

## 2023-01-19 ENCOUNTER — Encounter: Payer: Self-pay | Admitting: Family Medicine

## 2023-01-19 VITALS — BP 130/84 | HR 94 | Temp 98.4°F | Resp 16 | Ht 69.0 in | Wt 339.9 lb

## 2023-01-19 DIAGNOSIS — Z23 Encounter for immunization: Secondary | ICD-10-CM | POA: Diagnosis not present

## 2023-01-19 DIAGNOSIS — F320 Major depressive disorder, single episode, mild: Secondary | ICD-10-CM | POA: Diagnosis not present

## 2023-01-19 DIAGNOSIS — E1165 Type 2 diabetes mellitus with hyperglycemia: Secondary | ICD-10-CM

## 2023-01-19 DIAGNOSIS — I10 Essential (primary) hypertension: Secondary | ICD-10-CM

## 2023-01-19 DIAGNOSIS — Z7984 Long term (current) use of oral hypoglycemic drugs: Secondary | ICD-10-CM | POA: Diagnosis not present

## 2023-01-19 DIAGNOSIS — E782 Mixed hyperlipidemia: Secondary | ICD-10-CM

## 2023-01-19 DIAGNOSIS — F439 Reaction to severe stress, unspecified: Secondary | ICD-10-CM

## 2023-01-19 DIAGNOSIS — N3281 Overactive bladder: Secondary | ICD-10-CM | POA: Diagnosis not present

## 2023-01-19 MED ORDER — SEMAGLUTIDE (1 MG/DOSE) 4 MG/3ML ~~LOC~~ SOPN
1.0000 mg | PEN_INJECTOR | SUBCUTANEOUS | 1 refills | Status: DC
Start: 2023-01-19 — End: 2023-06-04

## 2023-01-19 NOTE — Assessment & Plan Note (Signed)
Per urology - doing well with current meds

## 2023-01-19 NOTE — Patient Instructions (Addendum)
[facility] [address], [place], [place] [address] Scheduling phone #: [phone]  Call to schedule your mammogram   Health Maintenance  Topic Date Due   Eye exam for diabetics  04/08/2020   Mammogram  03/05/2021   Flu Shot  12/14/2022   COVID-19 Vaccine (3 - Pfizer risk series) 02/04/2023*   Zoster (Shingles) Vaccine (1 of 2) 04/20/2023*   Hemoglobin A1C  04/12/2023   Yearly kidney health urinalysis for diabetes  05/12/2023   Yearly kidney function blood test for diabetes  10/10/2023   Complete foot exam   10/10/2023   Cologuard (Stool DNA test)  11/03/2023   DTaP/Tdap/Td vaccine (2 - Td or Tdap) 09/05/2025   Hepatitis C Screening  Completed   HIV Screening  Completed   HPV Vaccine  Aged Out   Pap Smear  Discontinued  *Topic was postponed. The date shown is not the original due date.

## 2023-01-19 NOTE — Assessment & Plan Note (Signed)
Good statin compliance managed on crestor Due for labs in Dec

## 2023-01-19 NOTE — Assessment & Plan Note (Signed)
Losing weight gradually over the past 6-7 months Never felt like she over ate and recently no big changes to her diet but she is losing weight, ozempic may have curbed her appetite a little bit   Wt Readings from Last 5 Encounters:  01/19/23 (!) 339 lb 14.4 oz (154.2 kg)  10/10/22 (!) 349 lb 6.4 oz (158.5 kg)  07/26/22 (!) 352 lb (159.7 kg)  07/05/22 (!) 365 lb 6.4 oz (165.7 kg)  06/20/22 (!) 368 lb (166.9 kg)   BMI Readings from Last 5 Encounters:  01/19/23 50.19 kg/m  10/10/22 51.60 kg/m  07/26/22 51.98 kg/m  07/05/22 53.96 kg/m  06/20/22 54.34 kg/m

## 2023-01-19 NOTE — Progress Notes (Signed)
Name: Elizabeth Hoffman   MRN: [id number]    DOB: [birth year]   Date:01/19/2023       Progress Note  Chief Complaint  Patient presents with   Follow-up   Diabetes     Subjective:   Elizabeth Hoffman is a 52 y.o. female, presents to clinic for routine f/up on chronic conditions  DM:   Pt managing DM with ozempic and metformin 1500 mg daily dose, tried to increase ozempic to 1 mg dose but she has not gotten the correct pen for all refills, in the last 3 months she got 2 months of 0.50 dose and then 1 month of 1 mg dose Reports good med compliance Pt has no SE from meds. Blood sugars not checking Denies: Polyuria, polydipsia, vision changes, neuropathy, hypoglycemia Recent pertinent labs: Lab Results  Component Value Date   HGBA1C 7.5 (H) 10/10/2022   HGBA1C 8.1 (H) 05/11/2022   HGBA1C 7.8 (H) 08/29/2021   Lab Results  Component Value Date   MICROALBUR 4.7 05/11/2022   LDLCALC 80 05/11/2022   CREATININE 0.96 10/10/2022   Standard of care and health maintenance: Urine Microalbumin:  UTD Foot exam:  UTD DM eye exam:  due - looking for provider ACEI/ARB:  yes Statin:  yes  On crestor due for labs in december Lab Results  Component Value Date   CHOL 150 05/11/2022   HDL 51 05/11/2022   LDLCALC 80 05/11/2022   TRIG 104 05/11/2022   CHOLHDL 2.9 05/11/2022   Obesity: Never felt like she over ate and recently no big changes to her diet but she is losing weight, ozempic may have curbed her appetite a little bit   Wt Readings from Last 5 Encounters:  01/19/23 (!) 339 lb 14.4 oz (154.2 kg)  10/10/22 (!) 349 lb 6.4 oz (158.5 kg)  07/26/22 (!) 352 lb (159.7 kg)  07/05/22 (!) 365 lb 6.4 oz (165.7 kg)  06/20/22 (!) 368 lb (166.9 kg)   BMI Readings from Last 5 Encounters:  01/19/23 50.19 kg/m  10/10/22 51.60 kg/m  07/26/22 51.98 kg/m  07/05/22 53.96 kg/m  06/20/22 54.34 kg/m   Mood/anxiety stable on zoloft 100    01/19/2023   11:25 AM 10/10/2022   10:59 AM  07/26/2022    1:36 PM 07/12/2022    1:24 PM 07/05/2022    3:36 PM  Depression screen PHQ 2/9  Decreased Interest 0 2 0 0 0  Down, Depressed, Hopeless 0 2 0 0 0  PHQ - 2 Score 0 4 0 0 0  Altered sleeping 0 0 0 0 0  Tired, decreased energy 0 0 0 0 0  Change in appetite 0 0 0 0 0  Feeling bad or failure about yourself  0 0 0 0 0  Trouble concentrating 0 0 0 0 0  Moving slowly or fidgety/restless 0 0 0 0 0  Suicidal thoughts 0 0 0 0 0  PHQ-9 Score 0 4 0 0 0  Difficult doing work/chores Not difficult at all Somewhat difficult Not difficult at all Not difficult at all Not difficult at all       Current Outpatient Medications:    amLODipine (NORVASC) 10 MG tablet, Take 1 tablet (10 mg total) by mouth daily., Disp: 90 tablet, Rfl: 1   fesoterodine (TOVIAZ) 4 MG TB24 tablet, Take 1 tablet (4 mg total) by mouth daily., Disp: 90 tablet, Rfl: 3   fluticasone (FLONASE) 50 MCG/ACT nasal spray, Place 2 sprays into both nostrils daily. Can  use twice daily for the first 3 days, then return to once daily, Disp: 9.9 mL, Rfl: 1   lisinopril-hydrochlorothiazide (ZESTORETIC) 10-12.5 MG tablet, Take 1 tablet by mouth daily., Disp: 90 tablet, Rfl: 1   meloxicam (MOBIC) 15 MG tablet, TAKE 1 TABLET BY MOUTH ONCE DAILY, Disp: 90 tablet, Rfl: 1   metFORMIN (GLUCOPHAGE-XR) 500 MG 24 hr tablet, Take 1000 mg po qam daily with food and 500 mg po qpm daily with food, Disp: 270 tablet, Rfl: 1   mirabegron ER (MYRBETRIQ) 50 MG TB24 tablet, Take 1 tablet (50 mg total) by mouth daily., Disp: 90 tablet, Rfl: 3   rosuvastatin (CRESTOR) 5 MG tablet, Take 1 tablet (5 mg total) by mouth daily., Disp: 90 tablet, Rfl: 3   Semaglutide, 1 MG/DOSE, 4 MG/3ML SOPN, Inject 1 mg as directed once a week., Disp: 3 mL, Rfl: 0   sertraline (ZOLOFT) 100 MG tablet, Take 1 tablet (100 mg total) by mouth daily., Disp: 90 tablet, Rfl: 3   ondansetron (ZOFRAN) 4 MG tablet, Take 1 tablet (4 mg total) by mouth every 8 (eight) hours as needed for  nausea or vomiting. (Patient not taking: Reported on 01/19/2023), Disp: 20 tablet, Rfl: 0  Current Facility-Administered Medications:    betamethasone acetate-betamethasone sodium phosphate (CELESTONE) injection 3 mg, 3 mg, Intra-articular, Once, Felecia Shelling, DPM  Patient Active Problem List   Diagnosis Date Noted   OAB (overactive bladder) 05/19/2022   Current mild episode of major depressive disorder, unspecified whether recurrent (HCC) 05/19/2022   Uncontrolled type 2 diabetes mellitus with hyperglycemia, without long-term current use of insulin (HCC) 10/01/2020   Situational stress 09/28/2020   Cubital tunnel syndrome 11/11/2018   Cervical radiculopathy 05/23/2018   OSA on CPAP 11/03/2016   History of cervical cancer 02/02/2016   Hypercoagulable state (HCC) 02/02/2016   Abnormal uterine bleeding (AUB) 10/02/2015   Obesity, morbid, BMI 50 or higher (HCC) 07/05/2015   Malignant neoplasm of upper-outer quadrant of left breast in female, estrogen receptor positive (HCC) 02/22/2015   Mixed hyperlipidemia 02/08/2015   GERD without esophagitis 02/03/2015   Primary hypertension 02/03/2015   De Quervain's tenosynovitis, bilateral 02/03/2015   Numerous moles 02/03/2015   History of loop electrosurgical excision procedure (LEEP) of cervix 02/03/2015    Past Surgical History:  Procedure Laterality Date   ABDOMINAL HYSTERECTOMY  06/15/2017   BREAST EXCISIONAL BIOPSY Left 03/18/2015   breast ca +   BREAST LUMPECTOMY WITH RADIOACTIVE SEED LOCALIZATION Left 03/18/2015   Procedure: BREAST LUMPECTOMY WITH RADIOACTIVE SEED LOCALIZATION;  Surgeon: Harriette Bouillon, MD;  Location: [facility];  Service: General;  Laterality: Left;   CARPAL TUNNEL RELEASE Bilateral 04/16/2020   CERVICAL BIOPSY  W/ LOOP ELECTRODE EXCISION     CHOLECYSTECTOMY     HERNIA REPAIR     MASTECTOMY, PARTIAL Left 02/15/2015   NASAL SEPTUM SURGERY     TONSILLECTOMY AND ADENOIDECTOMY     UVULECTOMY       Family History  Adopted: Yes  Problem Relation Age of Onset   Breast cancer Neg Hx     Social History   Tobacco Use   Smoking status: Never   Smokeless tobacco: Never  Vaping Use   Vaping status: Never Used  Substance Use Topics   Alcohol use: No    Alcohol/week: 0.0 standard drinks of alcohol   Drug use: No     No Known Allergies  Health Maintenance  Topic Date Due   OPHTHALMOLOGY EXAM  04/08/2020  MAMMOGRAM  03/05/2021   INFLUENZA VACCINE  12/14/2022   COVID-19 Vaccine (3 - Pfizer risk series) 02/04/2023 (Originally 09/30/2019)   Zoster Vaccines- Shingrix (1 of 2) 04/20/2023 (Originally 07/06/1990)   HEMOGLOBIN A1C  04/12/2023   Diabetic kidney evaluation - Urine ACR  05/12/2023   Diabetic kidney evaluation - eGFR measurement  10/10/2023   FOOT EXAM  10/10/2023   Fecal DNA (Cologuard)  11/03/2023   DTaP/Tdap/Td (2 - Td or Tdap) 09/05/2025   Hepatitis C Screening  Completed   HIV Screening  Completed   HPV VACCINES  Aged Out   PAP SMEAR-Modifier  Discontinued    Chart Review Today: I personally reviewed active problem list, medication list, allergies, family history, social history, health maintenance, notes from last encounter, lab results, imaging with the patient/caregiver today.   Review of Systems  Constitutional: Negative.   HENT: Negative.    Eyes: Negative.   Respiratory: Negative.    Cardiovascular: Negative.   Gastrointestinal: Negative.   Endocrine: Negative.   Genitourinary: Negative.   Musculoskeletal: Negative.   Skin: Negative.   Allergic/Immunologic: Negative.   Neurological: Negative.   Hematological: Negative.   Psychiatric/Behavioral: Negative.    All other systems reviewed and are negative.    Objective:   Vitals:   01/19/23 1125  BP: 130/84  Pulse: 94  Resp: 16  Temp: 98.4 F (36.9 C)  TempSrc: Oral  SpO2: 96%  Weight: (!) 339 lb 14.4 oz (154.2 kg)  Height: 5\' 9"  (1.753 m)    Body mass index is 50.19  kg/m.  Physical Exam Vitals and nursing note reviewed.  Constitutional:      General: She is not in acute distress.    Appearance: Normal appearance. She is well-developed. She is obese. She is not ill-appearing, toxic-appearing or diaphoretic.  HENT:     Head: Normocephalic and atraumatic.     Nose: Nose normal.  Eyes:     General:        Right eye: No discharge.        Left eye: No discharge.     Conjunctiva/sclera: Conjunctivae normal.  Neck:     Trachea: No tracheal deviation.  Cardiovascular:     Rate and Rhythm: Normal rate and regular rhythm.     Pulses: Normal pulses.     Heart sounds: Normal heart sounds. No murmur heard.    No friction rub. No gallop.  Pulmonary:     Effort: Pulmonary effort is normal. No respiratory distress.     Breath sounds: Normal breath sounds. No stridor. No wheezing, rhonchi or rales.  Skin:    General: Skin is warm and dry.     Findings: No rash.  Neurological:     Mental Status: She is alert.     Motor: No abnormal muscle tone.     Coordination: Coordination normal.     Gait: Gait normal.  Psychiatric:        Mood and Affect: Mood normal.        Behavior: Behavior normal.         Assessment & Plan:   Problem List Items Addressed This Visit       Cardiovascular and Mediastinum   Primary hypertension     Endocrine   Uncontrolled type 2 diabetes mellitus with hyperglycemia, without long-term current use of insulin (HCC) - Primary    Last A1C not yet at goal Lab Results  Component Value Date   HGBA1C 7.5 (H) 10/10/2022  Ozempic dose increased but she  hasn't gotten steady dosing of the 1 mg dose She is still taking 1500 mg metformin once daily in the am, reviewed again taking 1000 mg qam and 500 mg with dinner Need DM eye exam Pneumococcal vaccine due - discussed with pt today Due for labs       Relevant Medications   Semaglutide, 1 MG/DOSE, 4 MG/3ML SOPN   Other Relevant Orders   COMPLETE METABOLIC PANEL WITH GFR    Hemoglobin A1c     Genitourinary   OAB (overactive bladder)    Per urology - doing well with current meds        Other   Mixed hyperlipidemia    Good statin compliance managed on crestor Due for labs in Dec      Obesity, morbid, BMI 50 or higher (HCC)    Losing weight gradually over the past 6-7 months Never felt like she over ate and recently no big changes to her diet but she is losing weight, ozempic may have curbed her appetite a little bit   Wt Readings from Last 5 Encounters:  01/19/23 (!) 339 lb 14.4 oz (154.2 kg)  10/10/22 (!) 349 lb 6.4 oz (158.5 kg)  07/26/22 (!) 352 lb (159.7 kg)  07/05/22 (!) 365 lb 6.4 oz (165.7 kg)  06/20/22 (!) 368 lb (166.9 kg)   BMI Readings from Last 5 Encounters:  01/19/23 50.19 kg/m  10/10/22 51.60 kg/m  07/26/22 51.98 kg/m  07/05/22 53.96 kg/m  06/20/22 54.34 kg/m        Relevant Medications   Semaglutide, 1 MG/DOSE, 4 MG/3ML SOPN   Situational stress    See below - well controlled, stable on current management      Current mild episode of major depressive disorder, unspecified whether recurrent (HCC)    Mood/anxiety sx stable and well controlled on zoloft 100 Phq reviewed and negative today      Other Visit Diagnoses     Need for pneumococcal vaccination       Relevant Orders   Pneumococcal conjugate vaccine 20-valent (Completed)        Return for 3 month  dm/HLD/HTN .   Danelle Berry, PA-C 01/19/23 11:38 AM

## 2023-01-19 NOTE — Assessment & Plan Note (Signed)
See below - well controlled, stable on current management

## 2023-01-19 NOTE — Assessment & Plan Note (Signed)
Mood/anxiety sx stable and well controlled on zoloft 100 Phq reviewed and negative today

## 2023-01-19 NOTE — Assessment & Plan Note (Signed)
Last A1C not yet at goal Lab Results  Component Value Date   HGBA1C 7.5 (H) 10/10/2022  Ozempic dose increased but she hasn't gotten steady dosing of the 1 mg dose She is still taking 1500 mg metformin once daily in the am, reviewed again taking 1000 mg qam and 500 mg with dinner Need DM eye exam Pneumococcal vaccine due - discussed with pt today Due for labs

## 2023-01-29 DIAGNOSIS — R32 Unspecified urinary incontinence: Secondary | ICD-10-CM | POA: Diagnosis not present

## 2023-02-13 DIAGNOSIS — Z419 Encounter for procedure for purposes other than remedying health state, unspecified: Secondary | ICD-10-CM | POA: Diagnosis not present

## 2023-02-14 ENCOUNTER — Other Ambulatory Visit: Payer: Self-pay | Admitting: Family Medicine

## 2023-02-17 ENCOUNTER — Other Ambulatory Visit: Payer: Self-pay | Admitting: Family Medicine

## 2023-02-20 NOTE — Telephone Encounter (Signed)
Left vm

## 2023-02-21 ENCOUNTER — Encounter: Payer: Self-pay | Admitting: Family Medicine

## 2023-02-21 ENCOUNTER — Other Ambulatory Visit: Payer: Self-pay

## 2023-02-21 DIAGNOSIS — E1165 Type 2 diabetes mellitus with hyperglycemia: Secondary | ICD-10-CM

## 2023-02-21 NOTE — Telephone Encounter (Signed)
Called pt no answer left vm 

## 2023-02-26 ENCOUNTER — Other Ambulatory Visit: Payer: Self-pay | Admitting: Family Medicine

## 2023-03-05 ENCOUNTER — Other Ambulatory Visit: Payer: Self-pay | Admitting: Podiatry

## 2023-03-16 DIAGNOSIS — Z419 Encounter for procedure for purposes other than remedying health state, unspecified: Secondary | ICD-10-CM | POA: Diagnosis not present

## 2023-04-08 DIAGNOSIS — R32 Unspecified urinary incontinence: Secondary | ICD-10-CM | POA: Diagnosis not present

## 2023-04-15 DIAGNOSIS — Z419 Encounter for procedure for purposes other than remedying health state, unspecified: Secondary | ICD-10-CM | POA: Diagnosis not present

## 2023-05-02 ENCOUNTER — Telehealth: Payer: Self-pay | Admitting: *Deleted

## 2023-05-02 NOTE — Telephone Encounter (Signed)
Received fax from [facility] asking for the last six months of OV so that pt may receive incontinence supplies. Pt last seen 06/2022 and would need to be seen for insurance documentation.  Elizabeth Kitchenleft message to have patient return my call to schedule a OV with Carollee Herter.

## 2023-05-04 ENCOUNTER — Other Ambulatory Visit: Payer: Self-pay | Admitting: Family Medicine

## 2023-05-04 NOTE — Telephone Encounter (Signed)
Needs f/u appt 

## 2023-05-04 NOTE — Telephone Encounter (Signed)
Lvm letting pt know that prescription has been sent to pharmacy and for her to return call to schedule a follow up appointment for further refills

## 2023-05-12 ENCOUNTER — Other Ambulatory Visit: Payer: Self-pay | Admitting: Urology

## 2023-05-14 ENCOUNTER — Other Ambulatory Visit: Payer: Self-pay | Admitting: Family Medicine

## 2023-05-14 NOTE — Telephone Encounter (Signed)
Needs appt

## 2023-05-14 NOTE — Telephone Encounter (Signed)
Lvm to sch appt

## 2023-05-16 DIAGNOSIS — Z419 Encounter for procedure for purposes other than remedying health state, unspecified: Secondary | ICD-10-CM | POA: Diagnosis not present

## 2023-05-17 NOTE — Telephone Encounter (Signed)
 Error

## 2023-05-21 DIAGNOSIS — R32 Unspecified urinary incontinence: Secondary | ICD-10-CM | POA: Diagnosis not present

## 2023-06-04 ENCOUNTER — Ambulatory Visit: Payer: Medicaid Other | Admitting: Family Medicine

## 2023-06-04 ENCOUNTER — Encounter: Payer: Self-pay | Admitting: Family Medicine

## 2023-06-04 VITALS — BP 132/88 | HR 100 | Temp 98.1°F | Resp 18 | Ht 69.0 in | Wt 343.8 lb

## 2023-06-04 DIAGNOSIS — F320 Major depressive disorder, single episode, mild: Secondary | ICD-10-CM | POA: Diagnosis not present

## 2023-06-04 DIAGNOSIS — E782 Mixed hyperlipidemia: Secondary | ICD-10-CM

## 2023-06-04 DIAGNOSIS — I10 Essential (primary) hypertension: Secondary | ICD-10-CM

## 2023-06-04 DIAGNOSIS — E1165 Type 2 diabetes mellitus with hyperglycemia: Secondary | ICD-10-CM | POA: Diagnosis not present

## 2023-06-04 DIAGNOSIS — K219 Gastro-esophageal reflux disease without esophagitis: Secondary | ICD-10-CM | POA: Diagnosis not present

## 2023-06-04 DIAGNOSIS — G4733 Obstructive sleep apnea (adult) (pediatric): Secondary | ICD-10-CM

## 2023-06-04 DIAGNOSIS — Z23 Encounter for immunization: Secondary | ICD-10-CM | POA: Diagnosis not present

## 2023-06-04 DIAGNOSIS — N3281 Overactive bladder: Secondary | ICD-10-CM

## 2023-06-04 MED ORDER — METFORMIN HCL ER 500 MG PO TB24: ORAL_TABLET | ORAL | 1 refills | Status: DC

## 2023-06-04 MED ORDER — LISINOPRIL-HYDROCHLOROTHIAZIDE 10-12.5 MG PO TABS: 1.0000 | ORAL_TABLET | Freq: Every day | ORAL | 1 refills | Status: DC

## 2023-06-04 MED ORDER — ROSUVASTATIN CALCIUM 5 MG PO TABS: 5.0000 mg | ORAL_TABLET | Freq: Every day | ORAL | 3 refills | Status: DC

## 2023-06-04 MED ORDER — AMLODIPINE BESYLATE 10 MG PO TABS: 10.0000 mg | ORAL_TABLET | Freq: Every day | ORAL | 1 refills | Status: DC

## 2023-06-04 MED ORDER — SEMAGLUTIDE (1 MG/DOSE) 4 MG/3ML ~~LOC~~ SOPN: 1.0000 mg | PEN_INJECTOR | SUBCUTANEOUS | 1 refills | Status: DC

## 2023-06-04 NOTE — Progress Notes (Unsigned)
Name: Elizabeth Hoffman   MRN: [id number]    DOB: [birth year]   Date:06/04/2023       Progress Note  Chief Complaint  Patient presents with   Medical Management of Chronic Issues   Medication Refill   Diabetes   Hypertension   Hyperlipidemia     Subjective:   Elizabeth Hoffman is a 53 y.o. female, presents to clinic for routine f/up  On zoloft, mood/stress good, on 100 mg dose    06/04/2023   10:33 AM 01/19/2023   11:25 AM 10/10/2022   10:59 AM  Depression screen PHQ 2/9  Decreased Interest 0 0 2  Down, Depressed, Hopeless 0 0 2  PHQ - 2 Score 0 0 4  Altered sleeping 0 0 0  Tired, decreased energy 0 0 0  Change in appetite 0 0 0  Feeling bad or failure about yourself  1 0 0  Trouble concentrating 0 0 0  Moving slowly or fidgety/restless 0 0 0  Suicidal thoughts 0 0 0  PHQ-9 Score 1 0 4  Difficult doing work/chores Not difficult at all Not difficult at all Somewhat difficult      06/04/2023   10:33 AM 09/28/2020    9:52 AM  GAD 7 : Generalized Anxiety Score  Nervous, Anxious, on Edge 0 1  Control/stop worrying 0 3  Worry too much - different things 0 3  Trouble relaxing 0 1  Restless 0 1  Easily annoyed or irritable 0 2  Afraid - awful might happen 0 2  Total GAD 7 Score 0 13  Anxiety Difficulty Not difficult at all Somewhat difficult      ***   Current Outpatient Medications:    amLODipine (NORVASC) 10 MG tablet, Take 1 tablet (10 mg total) by mouth daily., Disp: 90 tablet, Rfl: 1   fesoterodine (TOVIAZ) 4 MG TB24 tablet, Take 1 tablet (4 mg total) by mouth daily., Disp: 90 tablet, Rfl: 3   fluticasone (FLONASE) 50 MCG/ACT nasal spray, Place 2 sprays into both nostrils daily. Can use twice daily for the first 3 days, then return to once daily, Disp: 9.9 mL, Rfl: 1   lisinopril-hydrochlorothiazide (ZESTORETIC) 10-12.5 MG tablet, TAKE ONE TABLET BY MOUTH EVERY DAY, Disp: 30 tablet, Rfl: 0   meloxicam (MOBIC) 15 MG tablet, TAKE 1 TABLET BY MOUTH ONCE DAILY,  Disp: 90 tablet, Rfl: 1   metFORMIN (GLUCOPHAGE-XR) 500 MG 24 hr tablet, Take 1000 mg po qam daily with food and 500 mg po qpm daily with food, Disp: 270 tablet, Rfl: 1   mirabegron ER (MYRBETRIQ) 25 MG TB24 tablet, TAKE 1 TABLET BY MOUTH ONCE DAILY, Disp: 30 tablet, Rfl: 3   mirabegron ER (MYRBETRIQ) 50 MG TB24 tablet, Take 1 tablet (50 mg total) by mouth daily., Disp: 90 tablet, Rfl: 3   ondansetron (ZOFRAN) 4 MG tablet, Take 1 tablet (4 mg total) by mouth every 8 (eight) hours as needed for nausea or vomiting., Disp: 20 tablet, Rfl: 0   rosuvastatin (CRESTOR) 5 MG tablet, Take 1 tablet (5 mg total) by mouth daily., Disp: 90 tablet, Rfl: 3   Semaglutide, 1 MG/DOSE, 4 MG/3ML SOPN, Inject 1 mg as directed once a week., Disp: 9 mL, Rfl: 1   Semaglutide,0.25 or 0.5MG /DOS, (OZEMPIC, 0.25 OR 0.5 MG/DOSE,) 2 MG/3ML SOPN, INJECT 0.25MG  INTO THE SKIN ONCE A WEEK FOR 14 DAYS AND THEN INJECT 0.5MG  ONCE AWEEK AS DIRECTED, Disp: 3 mL, Rfl: 0   sertraline (ZOLOFT) 100 MG tablet, Take  1 tablet (100 mg total) by mouth daily., Disp: 90 tablet, Rfl: 3  Current Facility-Administered Medications:    betamethasone acetate-betamethasone sodium phosphate (CELESTONE) injection 3 mg, 3 mg, Intra-articular, Once, Felecia Shelling, DPM  Patient Active Problem List   Diagnosis Date Noted   OAB (overactive bladder) 05/19/2022   Current mild episode of major depressive disorder, unspecified whether recurrent (HCC) 05/19/2022   Uncontrolled type 2 diabetes mellitus with hyperglycemia, without long-term current use of insulin (HCC) 10/01/2020   Situational stress 09/28/2020   Cubital tunnel syndrome 11/11/2018   Cervical radiculopathy 05/23/2018   OSA on CPAP 11/03/2016   History of cervical cancer 02/02/2016   Hypercoagulable state (HCC) 02/02/2016   Abnormal uterine bleeding (AUB) 10/02/2015   Obesity, morbid, BMI 50 or higher (HCC) 07/05/2015   Malignant neoplasm of upper-outer quadrant of left breast in female,  estrogen receptor positive (HCC) 02/22/2015   Mixed hyperlipidemia 02/08/2015   GERD without esophagitis 02/03/2015   Primary hypertension 02/03/2015   De Quervain's tenosynovitis, bilateral 02/03/2015   Numerous moles 02/03/2015   History of loop electrosurgical excision procedure (LEEP) of cervix 02/03/2015    Past Surgical History:  Procedure Laterality Date   ABDOMINAL HYSTERECTOMY  06/15/2017   BREAST EXCISIONAL BIOPSY Left 03/18/2015   breast ca +   BREAST LUMPECTOMY WITH RADIOACTIVE SEED LOCALIZATION Left 03/18/2015   Procedure: BREAST LUMPECTOMY WITH RADIOACTIVE SEED LOCALIZATION;  Surgeon: Harriette Bouillon, MD;  Location: [facility];  Service: General;  Laterality: Left;   CARPAL TUNNEL RELEASE Bilateral 04/16/2020   CERVICAL BIOPSY  W/ LOOP ELECTRODE EXCISION     CHOLECYSTECTOMY     HERNIA REPAIR     MASTECTOMY, PARTIAL Left 02/15/2015   NASAL SEPTUM SURGERY     TONSILLECTOMY AND ADENOIDECTOMY     UVULECTOMY      Family History  Adopted: Yes  Problem Relation Age of Onset   Breast cancer Neg Hx     Social History   Tobacco Use   Smoking status: Never   Smokeless tobacco: Never  Vaping Use   Vaping status: Never Used  Substance Use Topics   Alcohol use: No    Alcohol/week: 0.0 standard drinks of alcohol   Drug use: No     No Known Allergies  Health Maintenance  Topic Date Due   OPHTHALMOLOGY EXAM  04/08/2020   MAMMOGRAM  03/05/2021   HEMOGLOBIN A1C  04/12/2023   Diabetic kidney evaluation - Urine ACR  05/12/2023   COVID-19 Vaccine (3 - Pfizer risk series) 06/20/2023 (Originally 09/30/2019)   Zoster Vaccines- Shingrix (1 of 2) 09/02/2023 (Originally 07/06/1990)   Cervical Cancer Screening (HPV/Pap Cotest)  06/03/2024 (Originally 04/13/2022)   Diabetic kidney evaluation - eGFR measurement  10/10/2023   FOOT EXAM  10/10/2023   Fecal DNA (Cologuard)  11/03/2023   DTaP/Tdap/Td (2 - Td or Tdap) 09/05/2025   Pneumococcal Vaccine 60-61 Years old   Completed   INFLUENZA VACCINE  Completed   Hepatitis C Screening  Completed   HIV Screening  Completed   HPV VACCINES  Aged Out    Chart Review Today: I personally reviewed active problem list, medication list, allergies, family history, social history, health maintenance, notes from last encounter, lab results, imaging with the patient/caregiver today.   Review of Systems  Constitutional: Negative.   HENT: Negative.    Eyes: Negative.   Respiratory: Negative.    Cardiovascular: Negative.   Gastrointestinal: Negative.   Endocrine: Negative.   Genitourinary: Negative.   Musculoskeletal: Negative.  Skin: Negative.   Allergic/Immunologic: Negative.   Neurological: Negative.   Hematological: Negative.   Psychiatric/Behavioral: Negative.    All other systems reviewed and are negative.    Objective:   Vitals:   06/04/23 1010  BP: 132/88  Pulse: 100  Resp: 18  Temp: 98.1 F (36.7 C)  SpO2: 96%  Weight: (!) 343 lb 12.8 oz (155.9 kg)  Height: 5\' 9"  (1.753 m)    Body mass index is 50.77 kg/m.  Physical Exam Constitutional:      General: She is not in acute distress.    Appearance: Normal appearance. She is well-developed. She is obese. She is not ill-appearing, toxic-appearing or diaphoretic.  HENT:     Head: Normocephalic and atraumatic.     Right Ear: External ear normal.     Left Ear: External ear normal.     Nose: Nose normal.     Mouth/Throat:     Pharynx: Uvula midline.  Eyes:     General: Lids are normal.     Conjunctiva/sclera: Conjunctivae normal.     Pupils: Pupils are equal, round, and reactive to light.  Neck:     Trachea: Phonation normal. No tracheal deviation.  Cardiovascular:     Rate and Rhythm: Normal rate and regular rhythm.     Pulses: Normal pulses.          Radial pulses are 2+ on the right side and 2+ on the left side.       Posterior tibial pulses are 2+ on the right side and 2+ on the left side.     Heart sounds: Normal heart sounds.  No murmur heard.    No friction rub. No gallop.  Pulmonary:     Effort: Pulmonary effort is normal. No respiratory distress.     Breath sounds: Normal breath sounds. No stridor. No wheezing, rhonchi or rales.  Chest:     Chest wall: No tenderness.  Abdominal:     General: Bowel sounds are normal. There is no distension.     Palpations: Abdomen is soft.     Tenderness: There is no abdominal tenderness. There is no guarding or rebound.  Musculoskeletal:        General: No deformity. Normal range of motion.     Cervical back: Normal range of motion and neck supple.  Lymphadenopathy:     Cervical: No cervical adenopathy.  Skin:    General: Skin is warm and dry.     Capillary Refill: Capillary refill takes less than 2 seconds.     Coloration: Skin is not pale.     Findings: No rash.  Neurological:     Mental Status: She is alert and oriented to person, place, and time.     Motor: No abnormal muscle tone.     Gait: Gait normal.  Psychiatric:        Speech: Speech normal.        Behavior: Behavior normal.         Assessment & Plan:   Problem List Items Addressed This Visit       Cardiovascular and Mediastinum   Primary hypertension     Endocrine   Uncontrolled type 2 diabetes mellitus with hyperglycemia, without long-term current use of insulin (HCC) - Primary     Other   Mixed hyperlipidemia   Obesity, morbid, BMI 50 or higher (HCC)   Other Visit Diagnoses       Need for influenza vaccination       Relevant  Orders   Flu vaccine trivalent PF, 6mos and older(Flulaval,Afluria,Fluarix,Fluzone) (Completed)       Wt Readings from Last 10 Encounters:  06/04/23 (!) 343 lb 12.8 oz (155.9 kg)  01/19/23 (!) 339 lb 14.4 oz (154.2 kg)  10/10/22 (!) 349 lb 6.4 oz (158.5 kg)  07/26/22 (!) 352 lb (159.7 kg)  07/05/22 (!) 365 lb 6.4 oz (165.7 kg)  06/20/22 (!) 368 lb (166.9 kg)  05/11/22 (!) 365 lb (165.6 kg)  04/14/22 (!) 365 lb (165.6 kg)  08/29/21 (!) 375 lb 9.6 oz  (170.4 kg)  03/31/21 (!) 366 lb (166 kg)   BMI Readings from Last 5 Encounters:  06/04/23 50.77 kg/m  01/19/23 50.19 kg/m  10/10/22 51.60 kg/m  07/26/22 51.98 kg/m  07/05/22 53.96 kg/m      No follow-ups on file.   Danelle Berry, PA-C 06/04/23 10:25 AM

## 2023-06-05 LAB — MICROALBUMIN / CREATININE URINE RATIO
Creatinine, Urine: 132 mg/dL (ref 20–275)
Microalb Creat Ratio: 20 mg/g{creat} (ref <30)
Microalb, Ur: 2.6 mg/dL

## 2023-06-05 LAB — COMPLETE METABOLIC PANEL WITH GFR
AG Ratio: 1.5 (calc) (ref 1.0–2.5)
ALT: 21 U/L (ref 6–29)
AST: 19 U/L (ref 10–35)
Albumin: 4.6 g/dL (ref 3.6–5.1)
Alkaline phosphatase (APISO): 66 U/L (ref 37–153)
BUN: 16 mg/dL (ref 7–25)
CO2: 30 mmol/L (ref 20–32)
Calcium: 10.2 mg/dL (ref 8.6–10.4)
Chloride: 102 mmol/L (ref 98–110)
Creat: 1.02 mg/dL (ref 0.50–1.03)
Globulin: 3 g/dL (ref 1.9–3.7)
Glucose, Bld: 119 mg/dL — ABNORMAL HIGH (ref 65–99)
Potassium: 4.4 mmol/L (ref 3.5–5.3)
Sodium: 141 mmol/L (ref 135–146)
Total Bilirubin: 0.5 mg/dL (ref 0.2–1.2)
Total Protein: 7.6 g/dL (ref 6.1–8.1)
eGFR: 67 mL/min/{1.73_m2} (ref >=60)

## 2023-06-05 LAB — HEMOGLOBIN A1C
Hgb A1c MFr Bld: 6.9 %{Hb} — ABNORMAL HIGH (ref <5.7)
Mean Plasma Glucose: 151 mg/dL
eAG (mmol/L): 8.4 mmol/L

## 2023-06-05 LAB — CBC WITH DIFFERENTIAL/PLATELET
Absolute Lymphocytes: 2098 {cells}/uL (ref 850–3900)
Absolute Monocytes: 380 {cells}/uL (ref 200–950)
Basophils Absolute: 41 {cells}/uL (ref 0–200)
Basophils Relative: 0.6 %
Eosinophils Absolute: 380 {cells}/uL (ref 15–500)
Eosinophils Relative: 5.5 %
HCT: 42.1 % (ref 35.0–45.0)
Hemoglobin: 13.4 g/dL (ref 11.7–15.5)
MCH: 26.7 pg — ABNORMAL LOW (ref 27.0–33.0)
MCHC: 31.8 g/dL — ABNORMAL LOW (ref 32.0–36.0)
MCV: 83.9 fL (ref 80.0–100.0)
MPV: 12.1 fL (ref 7.5–12.5)
Monocytes Relative: 5.5 %
Neutro Abs: 4002 {cells}/uL (ref 1500–7800)
Neutrophils Relative %: 58 %
Platelets: 280 10*3/uL (ref 140–400)
RBC: 5.02 10*6/uL (ref 3.80–5.10)
RDW: 13.9 % (ref 11.0–15.0)
Total Lymphocyte: 30.4 %
WBC: 6.9 10*3/uL (ref 3.8–10.8)

## 2023-06-07 ENCOUNTER — Ambulatory Visit
Admission: RE | Admit: 2023-06-07 | Discharge: 2023-06-07 | Disposition: A | Discharge status: Home / Self Care | Payer: Medicaid Other | Source: Ambulatory Visit | Attending: Family Medicine | Admitting: Family Medicine

## 2023-06-07 DIAGNOSIS — Z1231 Encounter for screening mammogram for malignant neoplasm of breast: Secondary | ICD-10-CM | POA: Diagnosis not present

## 2023-06-12 ENCOUNTER — Telehealth: Payer: Self-pay

## 2023-06-12 NOTE — Telephone Encounter (Signed)
A started on ozempic

## 2023-06-16 DIAGNOSIS — Z419 Encounter for procedure for purposes other than remedying health state, unspecified: Secondary | ICD-10-CM | POA: Diagnosis not present

## 2023-06-18 ENCOUNTER — Ambulatory Visit: Payer: Medicaid Other | Admitting: Family Medicine

## 2023-06-19 NOTE — Progress Notes (Deleted)
 06/21/2023 11:56 AM   Elizabeth Hoffman Husband 1970-09-04 [id number]  Referring provider: Leavy Mole, PA-C [address] [place],  [place] [address]  Urological history: 1. OAB -contributing factors of diabetes, depression, vaginal atrophy, pelvic surgery, antihistamines, sleep apnea, decongestants and diuretics  -cysto (2019) - NED -managed with Myrbetriq  50 mg daily and Toviaz  4 mg daily ***  2. Incontinence -contributing factors of obesity, diabetes, depression, vaginal atrophy, pelvic surgery, antihistamines, decongestants and diuretics   3. Nocturia -contributing factors of obstructive sleep apnea, hypertension, diabetes, anxiety and depression -not on CPAP   No chief complaint on file.   HPI: Elizabeth Hoffman is a 53 y.o. female who presents today for yearly follow up.   Previous records reviewed.     PVR ***   PMH: Past Medical History:  Diagnosis Date   Anxiety    Arthritis    Breast cancer (HCC) 2016   Depression    Hot flashes    Hypertension    OSA on CPAP 11/03/2016   Personal history of radiation therapy     Surgical History: Past Surgical History:  Procedure Laterality Date   ABDOMINAL HYSTERECTOMY  06/15/2017   BREAST EXCISIONAL BIOPSY Left 03/18/2015   breast ca +   BREAST LUMPECTOMY WITH RADIOACTIVE SEED LOCALIZATION Left 03/18/2015   Procedure: BREAST LUMPECTOMY WITH RADIOACTIVE SEED LOCALIZATION;  Surgeon: Debby Shipper, MD;  Location: [facility];  Service: General;  Laterality: Left;   CARPAL TUNNEL RELEASE Bilateral 04/16/2020   CERVICAL BIOPSY  W/ LOOP ELECTRODE EXCISION     CHOLECYSTECTOMY     HERNIA REPAIR     MASTECTOMY, PARTIAL Left 02/15/2015   NASAL SEPTUM SURGERY     TONSILLECTOMY AND ADENOIDECTOMY     UVULECTOMY      Home Medications:  Allergies as of 06/21/2023   No Known Allergies      Medication List        Accurate as of June 19, 2023 11:56 AM. If you have any questions, ask your  nurse or doctor.          amLODipine  10 MG tablet Commonly known as: NORVASC  Take 1 tablet (10 mg total) by mouth daily.   fluticasone  50 MCG/ACT nasal spray Commonly known as: FLONASE  Place 2 sprays into both nostrils daily. Can use twice daily for the first 3 days, then return to once daily   lisinopril -hydrochlorothiazide  10-12.5 MG tablet Commonly known as: ZESTORETIC  Take 1 tablet by mouth daily.   meloxicam  15 MG tablet Commonly known as: MOBIC  TAKE 1 TABLET BY MOUTH ONCE DAILY   metFORMIN  500 MG 24 hr tablet Commonly known as: GLUCOPHAGE -XR Take 1000 mg po qam daily with food and 500 mg po qpm daily with food   mirabegron  ER 50 MG Tb24 tablet Commonly known as: MYRBETRIQ  Take 1 tablet (50 mg total) by mouth daily.   rosuvastatin  5 MG tablet Commonly known as: Crestor  Take 1 tablet (5 mg total) by mouth daily.   Semaglutide  (1 MG/DOSE) 4 MG/3ML Sopn Inject 1 mg as directed once a week.   sertraline  100 MG tablet Commonly known as: ZOLOFT  Take 1 tablet (100 mg total) by mouth daily.        Allergies: No Known Allergies  Family History: Family History  Adopted: Yes  Problem Relation Age of Onset   Breast cancer Neg Hx     Social History:  reports that she has never smoked. She has never used smokeless tobacco. She reports that  she does not drink alcohol and does not use drugs.  ROS: For pertinent review of systems please refer to history of present illness  Physical Exam: LMP 02/18/2015 (Approximate)   Constitutional:  Well nourished. Alert and oriented, No acute distress. HEENT:  AT, moist mucus membranes.  Trachea midline, no masses. Cardiovascular: No clubbing, cyanosis, or edema. Respiratory: Normal respiratory effort, no increased work of breathing. GU: No CVA tenderness.  No bladder fullness or masses.  Recession of labia minora, dry, pale vulvar vaginal mucosa and loss of mucosal ridges and folds.  Normal urethral meatus, no lesions, no  prolapse, no discharge.   No urethral masses, tenderness and/or tenderness. No bladder fullness, tenderness or masses. *** vagina mucosa, *** estrogen effect, no discharge, no lesions, *** pelvic support, *** cystocele and *** rectocele noted.  No cervical motion tenderness.  Uterus is freely mobile and non-fixed.  No adnexal/parametria masses or tenderness noted.  Anus and perineum are without rashes or lesions.   ***  Neurologic: Grossly intact, no focal deficits, moving all 4 extremities. Psychiatric: Normal mood and affect.    Laboratory Data: CBC    Component Value Date/Time   WBC 6.9 06/04/2023 1052   RBC 5.02 06/04/2023 1052   HGB 13.4 06/04/2023 1052   HGB 12.8 02/24/2015 1229   HCT 42.1 06/04/2023 1052   HCT 40.1 02/24/2015 1229   PLT 280 06/04/2023 1052   PLT 252 02/24/2015 1229   PLT 304 02/04/2015 0905   MCV 83.9 06/04/2023 1052   MCV 85.0 02/24/2015 1229   MCH 26.7 (L) 06/04/2023 1052   MCHC 31.8 (L) 06/04/2023 1052   RDW 13.9 06/04/2023 1052   RDW 15.0 (H) 02/24/2015 1229   LYMPHSABS 2,490 10/10/2022 1134   LYMPHSABS 3.0 02/24/2015 1229   MONOABS 408 11/30/2015 1123   MONOABS 0.6 02/24/2015 1229   EOSABS 380 06/04/2023 1052   EOSABS 0.7 (H) 02/24/2015 1229   EOSABS 0.4 02/04/2015 0905   EOSABS 0.2 10/15/2011 2103   BASOSABS 41 06/04/2023 1052   BASOSABS 0.0 02/24/2015 1229    CMP     Component Value Date/Time   NA 141 06/04/2023 1052   NA 141 02/24/2015 1229   K 4.4 06/04/2023 1052   K 3.7 02/24/2015 1229   CL 102 06/04/2023 1052   CL 107 09/23/2013 1231   CO2 30 06/04/2023 1052   CO2 25 02/24/2015 1229   GLUCOSE 119 (H) 06/04/2023 1052   GLUCOSE 114 02/24/2015 1229   BUN 16 06/04/2023 1052   BUN 10.9 02/24/2015 1229   CREATININE 1.02 06/04/2023 1052   CREATININE 0.9 02/24/2015 1229   CALCIUM  10.2 06/04/2023 1052   CALCIUM  9.5 02/24/2015 1229   PROT 7.6 06/04/2023 1052   PROT 7.5 02/24/2015 1229   ALBUMIN 3.8 11/30/2015 1123   ALBUMIN 3.7  02/24/2015 1229   AST 19 06/04/2023 1052   AST 29 02/24/2015 1229   ALT 21 06/04/2023 1052   ALT 32 02/24/2015 1229   ALKPHOS 53 11/30/2015 1123   ALKPHOS 77 02/24/2015 1229   BILITOT 0.5 06/04/2023 1052   BILITOT 0.30 02/24/2015 1229   EGFR 67 06/04/2023 1052   GFRNONAA 66 09/28/2020 1038    Component     Latest Ref Rng 06/04/2023  Hemoglobin A1C     <5.7 % of total Hgb 6.9 (H)   Mean Plasma Glucose     mg/dL 848   eAG (mmol/L)     mmol/L 8.4     Legend: (H) High I  have reviewed the labs.    Pertinent Imaging: ***  Assessment & Plan:    1. Mixed incontinence -She has been on her Toviaz  and Myrbetriq  for approximately 1 month and that is why her symptoms were so severe on the OAB questionnaire -She states that she is completely dry when she takes both the Myrbetriq  and Toviaz   2. Nocturia -continue CPAP machine  3. Urgency -see # 1  No follow-ups on file.  These notes generated with voice recognition software. I apologize for typographical errors.  Elizabeth Hoffman  [facility] [address]  [address] [place], [place] [address] [phone]

## 2023-06-21 ENCOUNTER — Ambulatory Visit: Payer: Self-pay | Admitting: Urology

## 2023-06-21 DIAGNOSIS — R351 Nocturia: Secondary | ICD-10-CM

## 2023-06-21 DIAGNOSIS — R3915 Urgency of urination: Secondary | ICD-10-CM

## 2023-06-21 DIAGNOSIS — N3946 Mixed incontinence: Secondary | ICD-10-CM

## 2023-06-30 ENCOUNTER — Other Ambulatory Visit: Payer: Self-pay | Admitting: Family Medicine

## 2023-07-03 ENCOUNTER — Ambulatory Visit: Payer: Medicaid Other | Admitting: Podiatry

## 2023-07-12 ENCOUNTER — Encounter: Payer: Self-pay | Admitting: Family Medicine

## 2023-07-12 MED ORDER — SEMAGLUTIDE (2 MG/DOSE) 8 MG/3ML ~~LOC~~ SOPN: 2.0000 mg | PEN_INJECTOR | SUBCUTANEOUS | 0 refills | Status: DC

## 2023-07-12 NOTE — Progress Notes (Unsigned)
 07/13/2023 9:19 AM   Elizabeth Hoffman 01-27-1971 [id number]  Referring provider: Danelle Berry, PA-C [address] [place],  [place] [address]  Urological history: 1. OAB -contributing factors of diabetes, depression, vaginal atrophy, pelvic surgery, antihistamines, sleep apnea, decongestants and diuretics  -cysto (2019) - NED -managed with Myrbetriq 50 mg daily and Toviaz 4 mg daily ***  2. Incontinence -contributing factors of obesity, diabetes, depression, vaginal atrophy, pelvic surgery, antihistamines, decongestants and diuretics   3. Nocturia -contributing factors of obstructive sleep apnea, hypertension, diabetes, anxiety and depression -not on CPAP   No chief complaint on file.   HPI: Elizabeth Hoffman is a 53 y.o. female who presents today for yearly follow up.   Previous records reviewed.     PVR ***   PMH: Past Medical History:  Diagnosis Date   Anxiety    Arthritis    Breast cancer (HCC) 2016   Depression    Hot flashes    Hypertension    OSA on CPAP 11/03/2016   Personal history of radiation therapy     Surgical History: Past Surgical History:  Procedure Laterality Date   ABDOMINAL HYSTERECTOMY  06/15/2017   BREAST EXCISIONAL BIOPSY Left 03/18/2015   breast ca +   BREAST LUMPECTOMY WITH RADIOACTIVE SEED LOCALIZATION Left 03/18/2015   Procedure: BREAST LUMPECTOMY WITH RADIOACTIVE SEED LOCALIZATION;  Surgeon: Harriette Bouillon, MD;  Location: [facility];  Service: General;  Laterality: Left;   CARPAL TUNNEL RELEASE Bilateral 04/16/2020   CERVICAL BIOPSY  W/ LOOP ELECTRODE EXCISION     CHOLECYSTECTOMY     HERNIA REPAIR     MASTECTOMY, PARTIAL Left 02/15/2015   NASAL SEPTUM SURGERY     TONSILLECTOMY AND ADENOIDECTOMY     UVULECTOMY      Home Medications:  Allergies as of 07/13/2023   No Known Allergies      Medication List        Accurate as of July 12, 2023  9:19 AM. If you have any questions, ask your  nurse or doctor.          amLODipine 10 MG tablet Commonly known as: NORVASC Take 1 tablet (10 mg total) by mouth daily.   fluticasone 50 MCG/ACT nasal spray Commonly known as: FLONASE Place 2 sprays into both nostrils daily. Can use twice daily for the first 3 days, then return to once daily   lisinopril-hydrochlorothiazide 10-12.5 MG tablet Commonly known as: ZESTORETIC Take 1 tablet by mouth daily.   meloxicam 15 MG tablet Commonly known as: MOBIC TAKE 1 TABLET BY MOUTH ONCE DAILY   metFORMIN 500 MG 24 hr tablet Commonly known as: GLUCOPHAGE-XR Take 1000 mg po qam daily with food and 500 mg po qpm daily with food   mirabegron ER 50 MG Tb24 tablet Commonly known as: MYRBETRIQ Take 1 tablet (50 mg total) by mouth daily.   rosuvastatin 5 MG tablet Commonly known as: Crestor Take 1 tablet (5 mg total) by mouth daily.   Semaglutide (1 MG/DOSE) 4 MG/3ML Sopn Inject 1 mg as directed once a week.   sertraline 100 MG tablet Commonly known as: ZOLOFT Take 1 tablet (100 mg total) by mouth daily.        Allergies: No Known Allergies  Family History: Family History  Adopted: Yes  Problem Relation Age of Onset   Breast cancer Neg Hx     Social History:  reports that she has never smoked. She has never used smokeless tobacco. She reports  that she does not drink alcohol and does not use drugs.  ROS: For pertinent review of systems please refer to history of present illness  Physical Exam: LMP 02/18/2015 (Approximate)   Constitutional:  Well nourished. Alert and oriented, No acute distress. HEENT: [place] AT, moist mucus membranes.  Trachea midline, no masses. Cardiovascular: No clubbing, cyanosis, or edema. Respiratory: Normal respiratory effort, no increased work of breathing. GU: No CVA tenderness.  No bladder fullness or masses.  Recession of labia minora, dry, pale vulvar vaginal mucosa and loss of mucosal ridges and folds.  Normal urethral meatus, no lesions, no  prolapse, no discharge.   No urethral masses, tenderness and/or tenderness. No bladder fullness, tenderness or masses. *** vagina mucosa, *** estrogen effect, no discharge, no lesions, *** pelvic support, *** cystocele and *** rectocele noted.  No cervical motion tenderness.  Uterus is freely mobile and non-fixed.  No adnexal/parametria masses or tenderness noted.  Anus and perineum are without rashes or lesions.   ***  Neurologic: Grossly intact, no focal deficits, moving all 4 extremities. Psychiatric: Normal mood and affect.    Laboratory Data: CBC    Component Value Date/Time   WBC 6.9 06/04/2023 1052   RBC 5.02 06/04/2023 1052   HGB 13.4 06/04/2023 1052   HGB 12.8 02/24/2015 1229   HCT 42.1 06/04/2023 1052   HCT 40.1 02/24/2015 1229   PLT 280 06/04/2023 1052   PLT 252 02/24/2015 1229   PLT 304 02/04/2015 0905   MCV 83.9 06/04/2023 1052   MCV 85.0 02/24/2015 1229   MCH 26.7 (L) 06/04/2023 1052   MCHC 31.8 (L) 06/04/2023 1052   RDW 13.9 06/04/2023 1052   RDW 15.0 (H) 02/24/2015 1229   LYMPHSABS 2,490 10/10/2022 1134   LYMPHSABS 3.0 02/24/2015 1229   MONOABS 408 11/30/2015 1123   MONOABS 0.6 02/24/2015 1229   EOSABS 380 06/04/2023 1052   EOSABS 0.7 (H) 02/24/2015 1229   EOSABS 0.4 02/04/2015 0905   EOSABS 0.2 10/15/2011 2103   BASOSABS 41 06/04/2023 1052   BASOSABS 0.0 02/24/2015 1229    CMP     Component Value Date/Time   NA 141 06/04/2023 1052   NA 141 02/24/2015 1229   K 4.4 06/04/2023 1052   K 3.7 02/24/2015 1229   CL 102 06/04/2023 1052   CL 107 09/23/2013 1231   CO2 30 06/04/2023 1052   CO2 25 02/24/2015 1229   GLUCOSE 119 (H) 06/04/2023 1052   GLUCOSE 114 02/24/2015 1229   BUN 16 06/04/2023 1052   BUN 10.9 02/24/2015 1229   CREATININE 1.02 06/04/2023 1052   CREATININE 0.9 02/24/2015 1229   CALCIUM 10.2 06/04/2023 1052   CALCIUM 9.5 02/24/2015 1229   PROT 7.6 06/04/2023 1052   PROT 7.5 02/24/2015 1229   ALBUMIN 3.8 11/30/2015 1123   ALBUMIN 3.7  02/24/2015 1229   AST 19 06/04/2023 1052   AST 29 02/24/2015 1229   ALT 21 06/04/2023 1052   ALT 32 02/24/2015 1229   ALKPHOS 53 11/30/2015 1123   ALKPHOS 77 02/24/2015 1229   BILITOT 0.5 06/04/2023 1052   BILITOT 0.30 02/24/2015 1229   EGFR 67 06/04/2023 1052   GFRNONAA 66 09/28/2020 1038    Component     Latest Ref Rng 06/04/2023  Hemoglobin A1C     <5.7 % of total Hgb 6.9 (H)   Mean Plasma Glucose     mg/dL 409   eAG (mmol/L)     mmol/L 8.4     Legend: (H) High  I have reviewed the labs.    Pertinent Imaging: ***  Assessment & Plan:    1. Mixed incontinence -She has been on her Gala Murdoch and Myrbetriq for approximately 1 month and that is why her symptoms were so severe on the OAB questionnaire -She states that she is completely dry when she takes both the Myrbetriq and Toviaz  2. Nocturia -continue CPAP machine  3. Urgency -see # 1  No follow-ups on file.  These notes generated with voice recognition software. I apologize for typographical errors.  Cloretta Ned  [facility] [address]  [address] [place], [place] [address] [phone]

## 2023-07-13 ENCOUNTER — Ambulatory Visit: Payer: Medicaid Other | Admitting: Urology

## 2023-07-13 ENCOUNTER — Encounter: Payer: Self-pay | Admitting: Urology

## 2023-07-13 VITALS — BP 130/82 | HR 73

## 2023-07-13 DIAGNOSIS — R351 Nocturia: Secondary | ICD-10-CM | POA: Diagnosis not present

## 2023-07-13 DIAGNOSIS — N3946 Mixed incontinence: Secondary | ICD-10-CM | POA: Diagnosis not present

## 2023-07-13 DIAGNOSIS — R3915 Urgency of urination: Secondary | ICD-10-CM

## 2023-07-13 LAB — BLADDER SCAN AMB NON-IMAGING: Scan Result: 17

## 2023-07-13 MED ORDER — MIRABEGRON ER 50 MG PO TB24: 50.0000 mg | ORAL_TABLET | Freq: Every day | ORAL | 4 refills | Status: DC

## 2023-07-14 DIAGNOSIS — Z419 Encounter for procedure for purposes other than remedying health state, unspecified: Secondary | ICD-10-CM | POA: Diagnosis not present

## 2023-07-16 ENCOUNTER — Telehealth: Payer: Self-pay | Admitting: Urology

## 2023-07-16 NOTE — Telephone Encounter (Signed)
 Angie from [facility] called. She states she faxed over The Certificate of Medical Necessity to be signed for medical supplies for patient and has not yet received it. Please advise patient.

## 2023-07-18 NOTE — Telephone Encounter (Signed)
 Documents faxed to [facility].

## 2023-08-06 ENCOUNTER — Ambulatory Visit: Admitting: Podiatry

## 2023-08-13 ENCOUNTER — Telehealth: Payer: Self-pay | Admitting: Urology

## 2023-08-13 NOTE — Telephone Encounter (Signed)
 Elizabeth Hoffman from [facility] called requesting for the Certification of Medical Necessity to be signed and faxed back to [phone]. She states the order form was received but the Certification of Medical Necessity was returned blank.

## 2023-08-17 NOTE — Telephone Encounter (Signed)
 Records, order, and Certification of Medical Necessity have all been faxed to [facility] with confirmation.

## 2023-08-20 DIAGNOSIS — R32 Unspecified urinary incontinence: Secondary | ICD-10-CM | POA: Diagnosis not present

## 2023-08-25 DIAGNOSIS — Z419 Encounter for procedure for purposes other than remedying health state, unspecified: Secondary | ICD-10-CM | POA: Diagnosis not present

## 2023-09-08 ENCOUNTER — Other Ambulatory Visit: Payer: Self-pay | Admitting: Family Medicine

## 2023-09-08 DIAGNOSIS — F320 Major depressive disorder, single episode, mild: Secondary | ICD-10-CM

## 2023-09-10 NOTE — Telephone Encounter (Signed)
 Requested Prescriptions  Pending Prescriptions Disp Refills   sertraline  (ZOLOFT ) 100 MG tablet [Pharmacy Med Name: SERTRALINE  HCL 100 MG TAB] 90 tablet 0    Sig: TAKE 1 TABLET BY MOUTH ONCE DAILY     Psychiatry:  Antidepressants - SSRI - sertraline  Failed - 09/10/2023  3:36 PM      Failed - Valid encounter within last 6 months    Recent Outpatient Visits   None     Future Appointments             In 2 months Adeline Hone, PA-C [facility]   In 10 months McGowan, Nyra Bellis [facility] [place]            Passed - AST in normal range and within 360 days    AST  Date Value Ref Range Status  06/04/2023 19 10 - 35 U/L Final  02/24/2015 29 5 - 34 U/L Final         Passed - ALT in normal range and within 360 days    ALT  Date Value Ref Range Status  06/04/2023 21 6 - 29 U/L Final  02/24/2015 32 0 - 55 U/L Final         Passed - Completed PHQ-2 or PHQ-9 in the last 360 days

## 2023-09-15 ENCOUNTER — Other Ambulatory Visit: Payer: Self-pay | Admitting: Family Medicine

## 2023-09-15 ENCOUNTER — Other Ambulatory Visit: Payer: Self-pay | Admitting: Internal Medicine

## 2023-09-15 DIAGNOSIS — I10 Essential (primary) hypertension: Secondary | ICD-10-CM

## 2023-09-18 ENCOUNTER — Ambulatory Visit: Admitting: Podiatry

## 2023-09-18 NOTE — Telephone Encounter (Signed)
 Requested Prescriptions  Refused Prescriptions Disp Refills   amLODipine  (NORVASC ) 10 MG tablet [Pharmacy Med Name: AMLODIPINE  BESYLATE 10 MG TAB] 90 tablet 1    Sig: TAKE 1 TABLET BY MOUTH DAILY     Cardiovascular: Calcium  Channel Blockers 2 Failed - 09/18/2023  9:15 AM      Failed - Valid encounter within last 6 months    Recent Outpatient Visits   None     Future Appointments             In 2 months Adeline Hone, PA-C [facility]   In 10 months McGowan, Nyra Bellis [facility] [place]            Passed - Last BP in normal range    BP Readings from Last 1 Encounters:  07/13/23 130/82         Passed - Last Heart Rate in normal range    Pulse Readings from Last 1 Encounters:  07/13/23 73

## 2023-09-18 NOTE — Telephone Encounter (Signed)
 Last OV 06/04/23.  Requested Prescriptions  Pending Prescriptions Disp Refills   Semaglutide ,0.25 or 0.5MG /DOS, (OZEMPIC , 0.25 OR 0.5 MG/DOSE,) 2 MG/3ML SOPN [Pharmacy Med Name: OZEMPIC  (0.25 OR 0.5 MG/DOSE) 2 MG/] 3 mL 0    Sig: INJECT 0.25MG  INTO THE SKIN ONCE A WEEK FOR 14 DAYS AND THEN INJECT 0.5MG  ONCE AWEEK AS DIRECTED     Endocrinology:  Diabetes - GLP-1 Receptor Agonists - semaglutide  Failed - 09/18/2023 11:39 AM      Failed - HBA1C in normal range and within 180 days    Hgb A1c MFr Bld  Date Value Ref Range Status  06/04/2023 6.9 (H) <5.7 % of total Hgb Final    Comment:    For someone without known diabetes, a hemoglobin A1c value of 6.5% or greater indicates that they may have  diabetes and this should be confirmed with a follow-up  test. . For someone with known diabetes, a value <7% indicates  that their diabetes is well controlled and a value  greater than or equal to 7% indicates suboptimal  control. A1c targets should be individualized based on  duration of diabetes, age, comorbid conditions, and  other considerations. . Currently, no consensus exists regarding use of hemoglobin A1c for diagnosis of diabetes for children. .          Failed - Valid encounter within last 6 months    Recent Outpatient Visits   None     Future Appointments             In 2 months Adeline Hone, PA-C [facility]   In 10 months McGowan, Nyra Bellis [facility] [place]            Passed - Cr in normal range and within 360 days    Creatinine  Date Value Ref Range Status  02/24/2015 0.9 0.6 - 1.1 mg/dL Final   Creat  Date Value Ref Range Status  06/04/2023 1.02 0.50 - 1.03 mg/dL Final   Creatinine, Urine  Date Value Ref Range Status  06/04/2023 132 20 - 275 mg/dL Final

## 2023-09-24 DIAGNOSIS — Z419 Encounter for procedure for purposes other than remedying health state, unspecified: Secondary | ICD-10-CM | POA: Diagnosis not present

## 2023-09-25 ENCOUNTER — Ambulatory Visit: Admitting: Podiatry

## 2023-09-25 ENCOUNTER — Encounter: Payer: Self-pay | Admitting: Podiatry

## 2023-09-25 ENCOUNTER — Ambulatory Visit (INDEPENDENT_AMBULATORY_CARE_PROVIDER_SITE_OTHER)

## 2023-09-25 DIAGNOSIS — D2372 Other benign neoplasm of skin of left lower limb, including hip: Secondary | ICD-10-CM | POA: Diagnosis not present

## 2023-09-25 DIAGNOSIS — M19072 Primary osteoarthritis, left ankle and foot: Secondary | ICD-10-CM

## 2023-09-25 DIAGNOSIS — M7752 Other enthesopathy of left foot: Secondary | ICD-10-CM | POA: Diagnosis not present

## 2023-09-25 DIAGNOSIS — M778 Other enthesopathies, not elsewhere classified: Secondary | ICD-10-CM

## 2023-09-25 MED ORDER — BETAMETHASONE SOD PHOS & ACET 6 (3-3) MG/ML IJ SUSP
3.0000 mg | Freq: Once | INTRAMUSCULAR | Status: DC
Start: 2023-09-25 — End: 2023-12-21

## 2023-09-25 NOTE — Progress Notes (Signed)
   Chief Complaint  Patient presents with   Foot Pain    "I have Arthritis in my foot.  He will hook me up with a shot and send me on my way."    HPI: 53 year old female presenting today for follow up evaluation of throbbing left foot pain.  Patient has developed a symptomatic skin lesion to the plantar aspect of the fifth MTP of the left foot.  Ongoing for few months now.  Also requesting injection today for capsulitis of the left foot.  Injections in the past to the left foot have helped significantly  Past Medical History:  Diagnosis Date   Anxiety    Arthritis    Breast cancer (HCC) 2016   Depression    Hot flashes    Hypertension    OSA on CPAP 11/03/2016   Personal history of radiation therapy    Past Surgical History:  Procedure Laterality Date   ABDOMINAL HYSTERECTOMY  06/15/2017   BREAST EXCISIONAL BIOPSY Left 03/18/2015   breast ca +   BREAST LUMPECTOMY WITH RADIOACTIVE SEED LOCALIZATION Left 03/18/2015   Procedure: BREAST LUMPECTOMY WITH RADIOACTIVE SEED LOCALIZATION;  Surgeon: Sim Dryer, MD;  Location: [facility];  Service: General;  Laterality: Left;   CARPAL TUNNEL RELEASE Bilateral 04/16/2020   CERVICAL BIOPSY  W/ LOOP ELECTRODE EXCISION     CHOLECYSTECTOMY     HERNIA REPAIR     MASTECTOMY, PARTIAL Left 02/15/2015   NASAL SEPTUM SURGERY     TONSILLECTOMY AND ADENOIDECTOMY     UVULECTOMY     No Known Allergies  Physical Exam: General: The patient is alert and oriented x3 in no acute distress.  Dermatology: Skin is warm, dry and supple bilateral lower extremities. Negative for open lesions or macerations.  There is some hyperkeratotic skin lesion with a central nucleated core noted to the plantar aspect of the fifth MTPJ left foot.  Associated tenderness to palpation  Vascular: Palpable pedal pulses bilaterally. No edema or erythema noted. Capillary refill within normal limits.  Neurological: Grossly intact via light touch  Musculoskeletal  Exam: No pedal deformity.  There is some tenderness with palpation along the fourth and fifth TMT of the left foot.  Tenderness with range of motion as well.  Assessment: 1.  Arthritis left foot; TMT 2.  Eccrine poroma left   Plan of Care:  -Patient evaluated.  -Injection of 0.5 cc Celestone  Soluspan injected along the fourth and fifth MTP left -Excisional debridement of the hyperkeratotic skin lesion was performed today using a 312 scalpel without incident or bleeding.  Salicylic acid applied. -Continue to advise against going barefoot.  Recommend good supportive tennis shoes and sneakers -Return to clinic as needed  *Daycare teacher standing all day.     Dot Gazella, DPM [facility]  Dr. Dot Gazella, DPM    [address] [place], [place] [address]                Office [phone]  Fax [phone]

## 2023-10-05 ENCOUNTER — Ambulatory Visit: Admitting: Podiatry

## 2023-10-24 ENCOUNTER — Encounter: Payer: Self-pay | Admitting: Nurse Practitioner

## 2023-10-24 ENCOUNTER — Ambulatory Visit (INDEPENDENT_AMBULATORY_CARE_PROVIDER_SITE_OTHER): Admitting: Nurse Practitioner

## 2023-10-24 ENCOUNTER — Ambulatory Visit: Admitting: Family Medicine

## 2023-10-24 VITALS — BP 120/82 | HR 70 | Resp 16 | Ht 69.0 in | Wt 342.0 lb

## 2023-10-24 DIAGNOSIS — Z111 Encounter for screening for respiratory tuberculosis: Secondary | ICD-10-CM | POA: Diagnosis not present

## 2023-10-24 DIAGNOSIS — Z0000 Encounter for general adult medical examination without abnormal findings: Secondary | ICD-10-CM

## 2023-10-24 DIAGNOSIS — E1165 Type 2 diabetes mellitus with hyperglycemia: Secondary | ICD-10-CM | POA: Diagnosis not present

## 2023-10-24 DIAGNOSIS — Z0001 Encounter for general adult medical examination with abnormal findings: Secondary | ICD-10-CM

## 2023-10-24 DIAGNOSIS — E782 Mixed hyperlipidemia: Secondary | ICD-10-CM | POA: Diagnosis not present

## 2023-10-24 DIAGNOSIS — Z1211 Encounter for screening for malignant neoplasm of colon: Secondary | ICD-10-CM

## 2023-10-24 DIAGNOSIS — I10 Essential (primary) hypertension: Secondary | ICD-10-CM

## 2023-10-24 NOTE — Progress Notes (Signed)
 Name: Elizabeth Hoffman   MRN: [id number]    DOB: [birth year]   Date:10/24/2023       Progress Note  Subjective  Chief Complaint  Chief Complaint  Patient presents with   Annual Exam    HPI  Patient presents for annual CPE.  Diet: well balanced diet Exercise: not intentional,  but active job,  recommend 150 min of physical activity weekly    Sleep: 4-7 hours Last dental exam:1 year ago Last eye exam: due  Flowsheet Row Office Visit from 10/24/2023 in [facility]  AUDIT-C Score 0      Depression: Phq 9 is  negative    10/24/2023   10:52 AM 06/04/2023   10:33 AM 01/19/2023   11:25 AM 10/10/2022   10:59 AM 07/26/2022    1:36 PM  Depression screen PHQ 2/9  Decreased Interest 0 0 0 2 0  Down, Depressed, Hopeless 0 0 0 2 0  PHQ - 2 Score 0 0 0 4 0  Altered sleeping 0 0 0 0 0  Tired, decreased energy 0 0 0 0 0  Change in appetite 0 0 0 0 0  Feeling bad or failure about yourself  0 1 0 0 0  Trouble concentrating 0 0 0 0 0  Moving slowly or fidgety/restless 0 0 0 0 0  Suicidal thoughts 0 0 0 0 0  PHQ-9 Score 0 1 0 4 0  Difficult doing work/chores  Not difficult at all Not difficult at all Somewhat difficult Not difficult at all   Hypertension: BP Readings from Last 3 Encounters:  10/24/23 120/82  07/13/23 130/82  06/04/23 132/88   Obesity: Wt Readings from Last 3 Encounters:  10/24/23 (!) 342 lb (155.1 kg)  06/04/23 (!) 343 lb 12.8 oz (155.9 kg)  01/19/23 (!) 339 lb 14.4 oz (154.2 kg)   BMI Readings from Last 3 Encounters:  10/24/23 50.50 kg/m  06/04/23 50.77 kg/m  01/19/23 50.19 kg/m     Vaccines:  HPV: up to at age 41 , ask insurance if age between 52-45  Shingrix : 43-64 yo and ask insurance if covered when patient above 93 yo Pneumonia:  educated and discussed with patient. Flu:  educated and discussed with patient.  Hep C Screening: completed STD testing and prevention (HIV/chl/gon/syphilis): completed Intimate partner  violence:none Sexual History : yes Menstrual History/LMP/Abnormal Bleeding: hysterectomy Incontinence Symptoms: yes,  on myrbetric seeing urology  Breast cancer:  - Last Mammogram: 06/07/2023 - BRCA gene screening: none  Osteoporosis: Discussed high calcium  and vitamin D supplementation, weight bearing exercises  Cervical cancer screening: hysterectomy  Skin cancer: Discussed monitoring for atypical lesions  Colorectal cancer: 11/02/2020, due this month   Lung cancer:   Low Dose CT Chest recommended if Age 61-80 years, 20 pack-year currently smoking OR have quit w/in 15years. Patient does not qualify.   ECG: 11/30/2015  Advanced Care Planning: A voluntary discussion about advance care planning including the explanation and discussion of advance directives.  Discussed health care proxy and Living will, and the patient was able to identify a health care proxy as oldest son, Shanell Aden.  Patient does not have a living will at present time. If patient does have living will, I have requested they bring this to the clinic to be scanned in to their chart.  Lipids: Lab Results  Component Value Date   CHOL 150 05/11/2022   CHOL 179 08/29/2021   CHOL 176 09/28/2020   Lab Results  Component Value Date  HDL 51 05/11/2022   HDL 63 08/29/2021   HDL 55 09/28/2020   Lab Results  Component Value Date   LDLCALC 80 05/11/2022   LDLCALC 95 08/29/2021   LDLCALC 95 09/28/2020   Lab Results  Component Value Date   TRIG 104 05/11/2022   TRIG 115 08/29/2021   TRIG 154 (H) 09/28/2020   Lab Results  Component Value Date   CHOLHDL 2.9 05/11/2022   CHOLHDL 2.8 08/29/2021   CHOLHDL 3.2 09/28/2020   No results found for: LDLDIRECT  Glucose: Glucose  Date Value Ref Range Status  02/24/2015 114 70 - 140 mg/dl Final    Comment:    Glucose reference range is for nonfasting patients. Fasting glucose reference range is 70- 100.  09/23/2013 102 (H) 65 - 99 mg/dL Final  11/91/4782 99 65 - 99  mg/dL Final   Glucose, Bld  Date Value Ref Range Status  06/04/2023 119 (H) 65 - 99 mg/dL Final    Comment:    .            Fasting reference interval . For someone without known diabetes, a glucose value between 100 and 125 mg/dL is consistent with prediabetes and should be confirmed with a follow-up test. .   10/10/2022 140 (H) 65 - 99 mg/dL Final    Comment:    .            Fasting reference interval . For someone without known diabetes, a glucose value >125 mg/dL indicates that they may have diabetes and this should be confirmed with a follow-up test. .   07/26/2022 107 (H) 65 - 99 mg/dL Final    Comment:    .            Fasting reference interval . For someone without known diabetes, a glucose value between 100 and 125 mg/dL is consistent with prediabetes and should be confirmed with a follow-up test. .     Patient Active Problem List   Diagnosis Date Noted   OAB (overactive bladder) 05/19/2022   Current mild episode of major depressive disorder, unspecified whether recurrent (HCC) 05/19/2022   Uncontrolled type 2 diabetes mellitus with hyperglycemia, without long-term current use of insulin (HCC) 10/01/2020   Situational stress 09/28/2020   Cubital tunnel syndrome 11/11/2018   Cervical radiculopathy 05/23/2018   OSA on CPAP 11/03/2016   History of cervical cancer 02/02/2016   Hypercoagulable state (HCC) 02/02/2016   Abnormal uterine bleeding (AUB) 10/02/2015   Obesity, morbid, BMI 50 or higher (HCC) 07/05/2015   Malignant neoplasm of upper-outer quadrant of left breast in female, estrogen receptor positive (HCC) 02/22/2015   Mixed hyperlipidemia 02/08/2015   GERD without esophagitis 02/03/2015   Primary hypertension 02/03/2015   De Quervain's tenosynovitis, bilateral 02/03/2015   Numerous moles 02/03/2015   History of loop electrosurgical excision procedure (LEEP) of cervix 02/03/2015    Past Surgical History:  Procedure Laterality Date   ABDOMINAL  HYSTERECTOMY  06/15/2017   BREAST EXCISIONAL BIOPSY Left 03/18/2015   breast ca +   BREAST LUMPECTOMY WITH RADIOACTIVE SEED LOCALIZATION Left 03/18/2015   Procedure: BREAST LUMPECTOMY WITH RADIOACTIVE SEED LOCALIZATION;  Surgeon: Sim Dryer, MD;  Location: [facility];  Service: General;  Laterality: Left;   BREAST SURGERY     CARPAL TUNNEL RELEASE Bilateral 04/16/2020   CERVICAL BIOPSY  W/ LOOP ELECTRODE EXCISION     CHOLECYSTECTOMY     HERNIA REPAIR     MASTECTOMY, PARTIAL Left 02/15/2015   NASAL  SEPTUM SURGERY     TONSILLECTOMY AND ADENOIDECTOMY     UVULECTOMY      Family History  Adopted: Yes  Problem Relation Age of Onset   Breast cancer Neg Hx     Social History   Socioeconomic History   Marital status: Married    Spouse name: Not on file   Number of children: 3   Years of education: Not on file   Highest education level: Associate degree: academic program  Occupational History   Not on file  Tobacco Use   Smoking status: Never   Smokeless tobacco: Never  Vaping Use   Vaping status: Never Used  Substance and Sexual Activity   Alcohol use: No   Drug use: No   Sexual activity: Yes    Partners: Male    Birth control/protection: Post-menopausal  Other Topics Concern   Not on file  Social History Narrative   Not on file   Social Drivers of Health   Financial Resource Strain: Medium Risk (06/03/2023)   Overall Financial Resource Strain (CARDIA)    Difficulty of Paying Living Expenses: Somewhat hard  Food Insecurity: Food Insecurity Present (06/03/2023)   Hunger Vital Sign    Worried About Running Out of Food in the Last Year: Sometimes true    Ran Out of Food in the Last Year: Sometimes true  Transportation Needs: No Transportation Needs (06/03/2023)   PRAPARE - Administrator, Civil Service (Medical): No    Lack of Transportation (Non-Medical): No  Physical Activity: Inactive (10/24/2023)   Exercise Vital Sign    Days of  Exercise per Week: 0 days    Minutes of Exercise per Session: 0 min  Stress: No Stress Concern Present (06/03/2023)   [employer] of Occupational Health - Occupational Stress Questionnaire    Feeling of Stress : Only a little  Social Connections: Moderately Integrated (06/03/2023)   Social Connection and Isolation Panel [NHANES]    Frequency of Communication with Friends and Family: More than three times a week    Frequency of Social Gatherings with Friends and Family: Once a week    Attends Religious Services: More than 4 times per year    Active Member of [employer] or Organizations: No    Attends Banker Meetings: Not on file    Marital Status: Married  Catering manager Violence: Not At Risk (10/24/2023)   Humiliation, Afraid, Rape, and Kick questionnaire    Fear of Current or Ex-Partner: No    Emotionally Abused: No    Physically Abused: No    Sexually Abused: No     Current Outpatient Medications:    amLODipine  (NORVASC ) 10 MG tablet, Take 1 tablet (10 mg total) by mouth daily., Disp: 90 tablet, Rfl: 1   lisinopril -hydrochlorothiazide  (ZESTORETIC ) 10-12.5 MG tablet, Take 1 tablet by mouth daily., Disp: 90 tablet, Rfl: 1   meloxicam  (MOBIC ) 15 MG tablet, TAKE 1 TABLET BY MOUTH ONCE DAILY, Disp: 90 tablet, Rfl: 1   metFORMIN  (GLUCOPHAGE -XR) 500 MG 24 hr tablet, Take 1000 mg po qam daily with food and 500 mg po qpm daily with food, Disp: 270 tablet, Rfl: 1   mirabegron  ER (MYRBETRIQ ) 50 MG TB24 tablet, Take 1 tablet (50 mg total) by mouth daily., Disp: 90 tablet, Rfl: 4   rosuvastatin  (CRESTOR ) 5 MG tablet, Take 1 tablet (5 mg total) by mouth daily., Disp: 90 tablet, Rfl: 3   Semaglutide ,0.25 or 0.5MG /DOS, (OZEMPIC , 0.25 OR 0.5 MG/DOSE,) 2 MG/3ML SOPN, INJECT 0.25MG   INTO THE SKIN ONCE A WEEK FOR 14 DAYS AND THEN INJECT 0.5MG  ONCE AWEEK AS DIRECTED, Disp: 3 mL, Rfl: 0   sertraline  (ZOLOFT ) 100 MG tablet, TAKE 1 TABLET BY MOUTH ONCE DAILY, Disp: 90 tablet, Rfl: 0    fluticasone  (FLONASE ) 50 MCG/ACT nasal spray, Place 2 sprays into both nostrils daily. Can use twice daily for the first 3 days, then return to once daily (Patient not taking: Reported on 10/24/2023), Disp: 9.9 mL, Rfl: 1   Semaglutide , 2 MG/DOSE, 8 MG/3ML SOPN, Inject 2 mg as directed once a week. (Patient not taking: Reported on 10/24/2023), Disp: 9 mL, Rfl: 0  Current Facility-Administered Medications:    betamethasone  acetate-betamethasone  sodium phosphate  (CELESTONE ) injection 3 mg, 3 mg, Intra-articular, Once, Evans, Ollie Bhat, DPM   betamethasone  acetate-betamethasone  sodium phosphate  (CELESTONE ) injection 3 mg, 3 mg, Intra-articular, Once,   No Known Allergies   ROS  Constitutional: Negative for fever or weight change.  Respiratory: Negative for cough and shortness of breath.   Cardiovascular: Negative for chest pain or palpitations.  Gastrointestinal: Negative for abdominal pain, no bowel changes.  Musculoskeletal: Negative for gait problem or joint swelling.  Skin: Negative for rash.  Neurological: Negative for dizziness or headache.  No other specific complaints in a complete review of systems (except as listed in HPI above).   Objective  Vitals:   10/24/23 1049  BP: 120/82  Pulse: 70  Resp: 16  SpO2: 96%  Weight: (!) 342 lb (155.1 kg)  Height: 5' 9 (1.753 m)    Body mass index is 50.5 kg/m.  Physical Exam Vitals reviewed.  Constitutional:      Appearance: Normal appearance.  HENT:     Head: Normocephalic.     Right Ear: Tympanic membrane normal.     Left Ear: Tympanic membrane normal.     Nose: Nose normal.  Eyes:     Extraocular Movements: Extraocular movements intact.     Conjunctiva/sclera: Conjunctivae normal.     Pupils: Pupils are equal, round, and reactive to light.  Neck:     Thyroid : No thyroid  mass, thyromegaly or thyroid  tenderness.  Cardiovascular:     Rate and Rhythm: Normal rate and regular rhythm.     Pulses: Normal pulses.     Heart  sounds: Normal heart sounds.  Pulmonary:     Effort: Pulmonary effort is normal.     Breath sounds: Normal breath sounds.  Abdominal:     General: Bowel sounds are normal.     Palpations: Abdomen is soft.  Musculoskeletal:        General: Normal range of motion.     Cervical back: Normal range of motion and neck supple.     Right lower leg: No edema.     Left lower leg: No edema.  Skin:    General: Skin is warm and dry.     Capillary Refill: Capillary refill takes less than 2 seconds.  Neurological:     General: No focal deficit present.     Mental Status: She is alert and oriented to person, place, and time. Mental status is at baseline.  Psychiatric:        Mood and Affect: Mood normal.        Behavior: Behavior normal.        Thought Content: Thought content normal.        Judgment: Judgment normal.      No results found for this or any previous visit (from the past 2160 hours).  Diabetic Foot Exam: Diabetic Foot Exam - Simple   Simple Foot Form Visual Inspection No deformities, no ulcerations, no other skin breakdown bilaterally: Yes Sensation Testing Intact to touch and monofilament testing bilaterally: Yes Pulse Check Posterior Tibialis and Dorsalis pulse intact bilaterally: Yes Comments      Fall Risk:    10/24/2023   10:48 AM 06/04/2023   10:15 AM 01/19/2023   11:25 AM 10/10/2022   10:59 AM 07/26/2022    1:36 PM  Fall Risk   Falls in the past year? 0 0 0 0 0  Number falls in past yr: 0 0 0 0 0  Injury with Fall? 0 0 0 0 0  Risk for fall due to : No Fall Risks  No Fall Risks No Fall Risks No Fall Risks  Follow up Falls prevention discussed  Falls prevention discussed;Education provided;Falls evaluation completed Falls prevention discussed;Education provided;Falls evaluation completed Falls prevention discussed;Education provided;Falls evaluation completed     Functional Status Survey: Is the patient deaf or have difficulty hearing?: No Does the patient  have difficulty seeing, even when wearing glasses/contacts?: No Does the patient have difficulty concentrating, remembering, or making decisions?: No Does the patient have difficulty walking or climbing stairs?: No Does the patient have difficulty dressing or bathing?: No Does the patient have difficulty doing errands alone such as visiting a doctor's office or shopping?: No   Assessment & Plan  1. Annual physical exam (Primary)  - CBC with Differential/Platelet - Comprehensive metabolic panel with GFR - Lipid panel - Hemoglobin A1c - QuantiFERON-TB Gold Plus - Cologuard  2. Uncontrolled type 2 diabetes mellitus with hyperglycemia, without long-term current use of insulin (HCC)  - Comprehensive metabolic panel with GFR - Hemoglobin A1c - HM Diabetes Foot Exam  3. Primary hypertension  - CBC with Differential/Platelet - Comprehensive metabolic panel with GFR  4. Mixed hyperlipidemia  - Lipid panel  5. Screening-pulmonary TB  - QuantiFERON-TB Gold Plus  6. Screening for colon cancer  - Cologuard   -USPSTF grade A and B recommendations reviewed with patient; age-appropriate recommendations, preventive care, screening tests, etc discussed and encouraged; healthy living encouraged; see AVS for patient education given to patient -Discussed importance of 150 minutes of physical activity weekly, eat two servings of fish weekly, eat one serving of tree nuts ( cashews, pistachios, pecans, almonds.Aaron Aas) every other day, eat 6 servings of fruit/vegetables daily and drink plenty of water and avoid sweet beverages.   -Reviewed Health Maintenance: yes

## 2023-10-25 DIAGNOSIS — Z419 Encounter for procedure for purposes other than remedying health state, unspecified: Secondary | ICD-10-CM | POA: Diagnosis not present

## 2023-10-28 ENCOUNTER — Ambulatory Visit: Payer: Self-pay | Admitting: Nurse Practitioner

## 2023-10-28 LAB — QUANTIFERON-TB GOLD PLUS
Mitogen-NIL: 8.37 [IU]/mL
NIL: 0.04 [IU]/mL
QuantiFERON-TB Gold Plus: NEGATIVE
TB1-NIL: 0 [IU]/mL
TB2-NIL: 0 [IU]/mL

## 2023-10-28 LAB — LIPID PANEL
Cholesterol: 146 mg/dL (ref <200)
HDL: 58 mg/dL (ref >=50)
LDL Cholesterol (Calc): 63 mg/dL
Non-HDL Cholesterol (Calc): 88 mg/dL (ref <130)
Total CHOL/HDL Ratio: 2.5 (calc) (ref <5.0)
Triglycerides: 173 mg/dL — ABNORMAL HIGH (ref <150)

## 2023-10-28 LAB — CBC WITH DIFFERENTIAL/PLATELET
Absolute Lymphocytes: 2074 {cells}/uL (ref 850–3900)
Absolute Monocytes: 474 {cells}/uL (ref 200–950)
Basophils Absolute: 32 {cells}/uL (ref 0–200)
Basophils Relative: 0.5 %
Eosinophils Absolute: 403 {cells}/uL (ref 15–500)
Eosinophils Relative: 6.3 %
HCT: 42.9 % (ref 35.0–45.0)
Hemoglobin: 13.1 g/dL (ref 11.7–15.5)
MCH: 26.1 pg — ABNORMAL LOW (ref 27.0–33.0)
MCHC: 30.5 g/dL — ABNORMAL LOW (ref 32.0–36.0)
MCV: 85.6 fL (ref 80.0–100.0)
MPV: 11.4 fL (ref 7.5–12.5)
Monocytes Relative: 7.4 %
Neutro Abs: 3418 {cells}/uL (ref 1500–7800)
Neutrophils Relative %: 53.4 %
Platelets: 247 10*3/uL (ref 140–400)
RBC: 5.01 10*6/uL (ref 3.80–5.10)
RDW: 14.3 % (ref 11.0–15.0)
Total Lymphocyte: 32.4 %
WBC: 6.4 10*3/uL (ref 3.8–10.8)

## 2023-10-28 LAB — COMPREHENSIVE METABOLIC PANEL WITH GFR
AG Ratio: 1.6 (calc) (ref 1.0–2.5)
ALT: 29 U/L (ref 6–29)
AST: 24 U/L (ref 10–35)
Albumin: 4.3 g/dL (ref 3.6–5.1)
Alkaline phosphatase (APISO): 56 U/L (ref 37–153)
BUN: 14 mg/dL (ref 7–25)
CO2: 28 mmol/L (ref 20–32)
Calcium: 9.3 mg/dL (ref 8.6–10.4)
Chloride: 106 mmol/L (ref 98–110)
Creat: 0.9 mg/dL (ref 0.50–1.03)
Globulin: 2.7 g/dL (ref 1.9–3.7)
Glucose, Bld: 115 mg/dL — ABNORMAL HIGH (ref 65–99)
Potassium: 4.8 mmol/L (ref 3.5–5.3)
Sodium: 142 mmol/L (ref 135–146)
Total Bilirubin: 0.4 mg/dL (ref 0.2–1.2)
Total Protein: 7 g/dL (ref 6.1–8.1)
eGFR: 77 mL/min/{1.73_m2} (ref >=60)

## 2023-10-28 LAB — HEMOGLOBIN A1C
Hgb A1c MFr Bld: 6.6 % — ABNORMAL HIGH (ref <5.7)
Mean Plasma Glucose: 143 mg/dL
eAG (mmol/L): 7.9 mmol/L

## 2023-11-20 ENCOUNTER — Other Ambulatory Visit: Payer: Self-pay | Admitting: Urology

## 2023-11-21 ENCOUNTER — Other Ambulatory Visit: Payer: Self-pay | Admitting: Urology

## 2023-11-21 ENCOUNTER — Other Ambulatory Visit: Payer: Self-pay | Admitting: Family Medicine

## 2023-11-21 DIAGNOSIS — F320 Major depressive disorder, single episode, mild: Secondary | ICD-10-CM

## 2023-11-23 NOTE — Telephone Encounter (Signed)
 Requested Prescriptions  Pending Prescriptions Disp Refills   sertraline  (ZOLOFT ) 100 MG tablet [Pharmacy Med Name: SERTRALINE  HCL 100 MG TAB] 90 tablet 1    Sig: TAKE 1 TABLET BY MOUTH ONCE DAILY     Psychiatry:  Antidepressants - SSRI - sertraline  Passed - 11/23/2023 10:39 AM      Passed - AST in normal range and within 360 days    AST  Date Value Ref Range Status  10/24/2023 24 10 - 35 U/L Final  02/24/2015 29 5 - 34 U/L Final         Passed - ALT in normal range and within 360 days    ALT  Date Value Ref Range Status  10/24/2023 29 6 - 29 U/L Final  02/24/2015 32 0 - 55 U/L Final         Passed - Completed PHQ-2 or PHQ-9 in the last 360 days      Passed - Valid encounter within last 6 months    Recent Outpatient Visits           1 month ago Annual physical exam   [facility] Gareth Mliss FALCON, FNP       Future Appointments             In 7 months McGowan, Clotilda DELENA RIGGERS [facility] [place]

## 2023-11-24 DIAGNOSIS — Z419 Encounter for procedure for purposes other than remedying health state, unspecified: Secondary | ICD-10-CM | POA: Diagnosis not present

## 2023-12-03 ENCOUNTER — Ambulatory Visit: Payer: Self-pay | Admitting: Family Medicine

## 2023-12-10 ENCOUNTER — Encounter: Payer: Self-pay | Admitting: Family Medicine

## 2023-12-10 DIAGNOSIS — R3915 Urgency of urination: Secondary | ICD-10-CM

## 2023-12-10 DIAGNOSIS — E1165 Type 2 diabetes mellitus with hyperglycemia: Secondary | ICD-10-CM

## 2023-12-11 MED ORDER — MIRABEGRON ER 50 MG PO TB24: 50.0000 mg | ORAL_TABLET | Freq: Every day | ORAL | 4 refills | Status: DC

## 2023-12-21 ENCOUNTER — Encounter: Payer: Self-pay | Admitting: Podiatry

## 2023-12-21 ENCOUNTER — Encounter: Payer: Self-pay | Admitting: Family Medicine

## 2023-12-21 ENCOUNTER — Ambulatory Visit: Admitting: Podiatry

## 2023-12-21 VITALS — Ht 69.0 in | Wt 342.0 lb

## 2023-12-21 DIAGNOSIS — M19072 Primary osteoarthritis, left ankle and foot: Secondary | ICD-10-CM

## 2023-12-21 MED ORDER — SEMAGLUTIDE (1 MG/DOSE) 4 MG/3ML ~~LOC~~ SOPN: 1.0000 mg | PEN_INJECTOR | SUBCUTANEOUS | 2 refills | Status: DC

## 2023-12-21 MED ORDER — METHYLPREDNISOLONE 4 MG PO TBPK
ORAL_TABLET | ORAL | 0 refills | Status: DC
Start: 2023-12-21 — End: 2024-01-15

## 2023-12-21 MED ORDER — BETAMETHASONE SOD PHOS & ACET 6 (3-3) MG/ML IJ SUSP
3.0000 mg | Freq: Once | INTRAMUSCULAR | Status: AC
  Administered 2023-12-21: 3 mg via INTRA_ARTICULAR

## 2023-12-21 NOTE — Progress Notes (Signed)
   Chief Complaint  Patient presents with   Foot Pain    Pt is here due to left foot pain, states she has arthritis in the foot which is causing some pain, would like to receive an injection into the foot.    HPI: Presenting today for follow-up evaluation of left midfoot pain.  Past Medical History:  Diagnosis Date   Anxiety    Arthritis    Breast cancer (HCC) 2016   Depression    GERD (gastroesophageal reflux disease)    Hot flashes    Hypertension    OSA on CPAP 11/03/2016   Personal history of radiation therapy    Sleep apnea    Past Surgical History:  Procedure Laterality Date   ABDOMINAL HYSTERECTOMY  06/15/2017   BREAST EXCISIONAL BIOPSY Left 03/18/2015   breast ca +   BREAST LUMPECTOMY WITH RADIOACTIVE SEED LOCALIZATION Left 03/18/2015   Procedure: BREAST LUMPECTOMY WITH RADIOACTIVE SEED LOCALIZATION;  Surgeon: Debby Shipper, MD;  Location: [facility];  Service: General;  Laterality: Left;   BREAST SURGERY     CARPAL TUNNEL RELEASE Bilateral 04/16/2020   CERVICAL BIOPSY  W/ LOOP ELECTRODE EXCISION     CHOLECYSTECTOMY     HERNIA REPAIR     MASTECTOMY, PARTIAL Left 02/15/2015   NASAL SEPTUM SURGERY     TONSILLECTOMY AND ADENOIDECTOMY     UVULECTOMY     No Known Allergies  Physical Exam: General: The patient is alert and oriented x3 in no acute distress.  Dermatology: Skin is warm, dry and supple bilateral lower extremities. Negative for open lesions or macerations.  There is some hyperkeratotic skin lesion with a central nucleated core noted to the plantar aspect of the fifth MTPJ left foot.  Associated tenderness to palpation  Vascular: Palpable pedal pulses bilaterally. No edema or erythema noted. Capillary refill within normal limits.  Neurological: Grossly intact via light touch  Musculoskeletal Exam: No pedal deformity.  Tenderness with palpation to the midtarsal joint of the left foot  Radiographic exam LT foot 09/27/2023: No acute  fracture identified.  Joint spaces mostly preserved with exception of some degenerative changes noted through the midtarsal joint left foot  Assessment: 1.  Arthritis left foot; TMT  Plan of Care:  -Patient evaluated.  -Injection of 0.5 cc Celestone  Soluspan injected along the dorsal midfoot left -Prescription for Medrol  Dosepak.  Then resume meloxicam  15 mg daily as prescribed -Continue to advise against going barefoot.  Recommend good supportive tennis shoes and sneakers-patient has an upcoming appointment with PCP. She is going to request an arthritic panel -Return to clinic as needed  *Daycare teacher standing all day.     Thresa EMERSON Sar, DPM [facility]  Dr. Thresa EMERSON Sar, DPM    [address] [place], [place] [address]                Office [phone]  Fax [phone]

## 2023-12-22 DIAGNOSIS — R32 Unspecified urinary incontinence: Secondary | ICD-10-CM | POA: Diagnosis not present

## 2023-12-25 DIAGNOSIS — Z419 Encounter for procedure for purposes other than remedying health state, unspecified: Secondary | ICD-10-CM | POA: Diagnosis not present

## 2024-01-07 ENCOUNTER — Ambulatory Visit: Admitting: Family Medicine

## 2024-01-11 ENCOUNTER — Other Ambulatory Visit (HOSPITAL_COMMUNITY): Payer: Self-pay

## 2024-01-15 ENCOUNTER — Ambulatory Visit: Admitting: Family Medicine

## 2024-01-15 ENCOUNTER — Encounter: Payer: Self-pay | Admitting: Family Medicine

## 2024-01-15 VITALS — BP 122/72 | HR 98 | Resp 16 | Ht 69.0 in | Wt 349.6 lb

## 2024-01-15 DIAGNOSIS — I10 Essential (primary) hypertension: Secondary | ICD-10-CM

## 2024-01-15 DIAGNOSIS — L608 Other nail disorders: Secondary | ICD-10-CM

## 2024-01-15 DIAGNOSIS — E118 Type 2 diabetes mellitus with unspecified complications: Secondary | ICD-10-CM | POA: Diagnosis not present

## 2024-01-15 DIAGNOSIS — M2550 Pain in unspecified joint: Secondary | ICD-10-CM | POA: Diagnosis not present

## 2024-01-15 NOTE — Progress Notes (Signed)
 Name: Elizabeth Hoffman   MRN: [id number]    DOB: 10/03/1970   Date:01/15/2024       Progress Note  Subjective  Chief Complaint  Chief Complaint  Patient presents with   Req Labs    Pt states saw podiatry and suggested for her to come have labs done that could relate to possible rheumatoid arthritis   Discussed the use of AI scribe software for clinical note transcription with the patient, who gave verbal consent to proceed.  History of Present Illness Elizabeth Hoffman is a 53 year old female with arthritis who presents with worsening joint pain and swelling.  She has been experiencing worsening joint pain and swelling, particularly in her left foot, which has been treated with annual injections for arthritis. The frequency and severity of the pain have increased over time.  There is swelling and reduced range of motion in the proximal joints of her fingers, especially the index and middle fingers of both hands, with the right hand being more affected. Her fingers are swollen and lack range of motion compared to her other fingers.  She has had episodes of severe pain in her left knee without any preceding trauma, leading her to use a cane for about a week due to fear of the knee giving out.  Her podiatrist noted ridges in her fingernails and mentioned that this can be seen in psoriasis, but that it is hard to diagnose. She also notes pitting in the nails, particularly in the middle finger of her right hand, which sometimes feels warm.  She completed a course of prednisone  about a month ago for a flare-up of her symptoms.  She has a history of hypertension, managed with lisinopril  HCTZ 10/12.5 mg, and her blood pressure is usually around 120/70 mmHg.  No stiffness in her joints but reports pain and swelling. No rashes.    Patient Active Problem List   Diagnosis Date Noted   OAB (overactive bladder) 05/19/2022   Current mild episode of major depressive disorder, unspecified whether  recurrent (HCC) 05/19/2022   Uncontrolled type 2 diabetes mellitus with hyperglycemia, without long-term current use of insulin (HCC) 10/01/2020   Situational stress 09/28/2020   Cubital tunnel syndrome 11/11/2018   Cervical radiculopathy 05/23/2018   OSA on CPAP 11/03/2016   History of cervical cancer 02/02/2016   Hypercoagulable state (HCC) 02/02/2016   Abnormal uterine bleeding (AUB) 10/02/2015   Obesity, morbid, BMI 50 or higher (HCC) 07/05/2015   Malignant neoplasm of upper-outer quadrant of left breast in female, estrogen receptor positive (HCC) 02/22/2015   Mixed hyperlipidemia 02/08/2015   GERD without esophagitis 02/03/2015   Primary hypertension 02/03/2015   De Quervain's tenosynovitis, bilateral 02/03/2015   Numerous moles 02/03/2015   History of loop electrosurgical excision procedure (LEEP) of cervix 02/03/2015    Past Surgical History:  Procedure Laterality Date   ABDOMINAL HYSTERECTOMY  06/15/2017   BREAST EXCISIONAL BIOPSY Left 03/18/2015   breast ca +   BREAST LUMPECTOMY WITH RADIOACTIVE SEED LOCALIZATION Left 03/18/2015   Procedure: BREAST LUMPECTOMY WITH RADIOACTIVE SEED LOCALIZATION;  Surgeon: Debby Shipper, MD;  Location: [facility];  Service: General;  Laterality: Left;   BREAST SURGERY     CARPAL TUNNEL RELEASE Bilateral 04/16/2020   CERVICAL BIOPSY  W/ LOOP ELECTRODE EXCISION     CHOLECYSTECTOMY     HERNIA REPAIR     MASTECTOMY, PARTIAL Left 02/15/2015   NASAL SEPTUM SURGERY     TONSILLECTOMY AND ADENOIDECTOMY  UVULECTOMY      Family History  Adopted: Yes  Problem Relation Age of Onset   Breast cancer Neg Hx     Social History   Tobacco Use   Smoking status: Never   Smokeless tobacco: Never  Substance Use Topics   Alcohol use: No     Current Outpatient Medications:    amLODipine  (NORVASC ) 10 MG tablet, Take 1 tablet (10 mg total) by mouth daily., Disp: 90 tablet, Rfl: 1   lisinopril -hydrochlorothiazide  (ZESTORETIC )  10-12.5 MG tablet, Take 1 tablet by mouth daily., Disp: 90 tablet, Rfl: 1   meloxicam  (MOBIC ) 15 MG tablet, TAKE 1 TABLET BY MOUTH ONCE DAILY, Disp: 90 tablet, Rfl: 1   metFORMIN  (GLUCOPHAGE -XR) 500 MG 24 hr tablet, Take 1000 mg po qam daily with food and 500 mg po qpm daily with food, Disp: 270 tablet, Rfl: 1   mirabegron  ER (MYRBETRIQ ) 50 MG TB24 tablet, Take 1 tablet (50 mg total) by mouth daily., Disp: 90 tablet, Rfl: 4   rosuvastatin  (CRESTOR ) 5 MG tablet, Take 1 tablet (5 mg total) by mouth daily., Disp: 90 tablet, Rfl: 3   Semaglutide , 1 MG/DOSE, 4 MG/3ML SOPN, Inject 1 mg as directed once a week., Disp: 3 mL, Rfl: 2   sertraline  (ZOLOFT ) 100 MG tablet, TAKE 1 TABLET BY MOUTH ONCE DAILY, Disp: 90 tablet, Rfl: 1   fluticasone  (FLONASE ) 50 MCG/ACT nasal spray, Place 2 sprays into both nostrils daily. Can use twice daily for the first 3 days, then return to once daily (Patient not taking: Reported on 01/15/2024), Disp: 9.9 mL, Rfl: 1   methylPREDNISolone  (MEDROL  DOSEPAK) 4 MG TBPK tablet, 6 day dose pack - take as directed (Patient not taking: Reported on 01/15/2024), Disp: 21 tablet, Rfl: 0  No Known Allergies  I personally reviewed active problem list, medication list, allergies, family history with the patient/caregiver today.   ROS  Ten systems reviewed and is negative except as mentioned in HPI    Objective Physical Exam CONSTITUTIONAL: Patient appears well-developed and well-nourished. No distress. HEENT: Head atraumatic, normocephalic, neck supple. CARDIOVASCULAR: Normal rate, regular rhythm and normal heart sounds. Heart murmur present. No BLE edema. PULMONARY: Effort normal and breath sounds normal. Lungs clear to auscultation bilaterally. No respiratory distress. ABDOMINAL: There is no tenderness or distention. MUSCULOSKELETAL: Normal gait. Without gross motor or sensory deficit. PSYCHIATRIC: Patient has a normal mood and affect. Behavior is normal. Judgment and thought content  normal.  Vitals:   01/15/24 1047  BP: (!) 146/76  Pulse: 98  Resp: 16  SpO2: 99%  Weight: (!) 349 lb 9.6 oz (158.6 kg)  Height: 5' 9 (1.753 m)    Body mass index is 51.63 kg/m.  Recent Results (from the past 2160 hours)  CBC with Differential/Platelet     Status: Abnormal   Collection Time: 10/24/23 11:21 AM  Result Value Ref Range   WBC 6.4 3.8 - 10.8 Thousand/uL   RBC 5.01 3.80 - 5.10 Million/uL   Hemoglobin 13.1 11.7 - 15.5 g/dL   HCT 57.0 64.9 - 54.9 %   MCV 85.6 80.0 - 100.0 fL   MCH 26.1 (L) 27.0 - 33.0 pg   MCHC 30.5 (L) 32.0 - 36.0 g/dL    Comment: For adults, a slight decrease in the calculated MCHC value (in the range of 30 to 32 g/dL) is most likely not clinically significant; however, it should be interpreted with caution in correlation with other red cell parameters and the patient's clinical condition.  RDW 14.3 11.0 - 15.0 %   Platelets 247 140 - 400 Thousand/uL   MPV 11.4 7.5 - 12.5 fL   Neutro Abs 3,418 1,500 - 7,800 cells/uL   Absolute Lymphocytes 2,074 850 - 3,900 cells/uL   Absolute Monocytes 474 200 - 950 cells/uL   Eosinophils Absolute 403 15 - 500 cells/uL   Basophils Absolute 32 0 - 200 cells/uL   Neutrophils Relative % 53.4 %   Total Lymphocyte 32.4 %   Monocytes Relative 7.4 %   Eosinophils Relative 6.3 %   Basophils Relative 0.5 %  Comprehensive metabolic panel with GFR     Status: Abnormal   Collection Time: 10/24/23 11:21 AM  Result Value Ref Range   Glucose, Bld 115 (H) 65 - 99 mg/dL    Comment: .            Fasting reference interval . For someone without known diabetes, a glucose value between 100 and 125 mg/dL is consistent with prediabetes and should be confirmed with a follow-up test. .    BUN 14 7 - 25 mg/dL   Creat 9.09 9.49 - 8.96 mg/dL   eGFR 77 > OR = 60 fO/fpw/8.26f7   BUN/Creatinine Ratio SEE NOTE: 6 - 22 (calc)    Comment:    Not Reported: BUN and Creatinine are within    reference range. .    Sodium 142  135 - 146 mmol/L   Potassium 4.8 3.5 - 5.3 mmol/L   Chloride 106 98 - 110 mmol/L   CO2 28 20 - 32 mmol/L   Calcium  9.3 8.6 - 10.4 mg/dL   Total Protein 7.0 6.1 - 8.1 g/dL   Albumin 4.3 3.6 - 5.1 g/dL   Globulin 2.7 1.9 - 3.7 g/dL (calc)   AG Ratio 1.6 1.0 - 2.5 (calc)   Total Bilirubin 0.4 0.2 - 1.2 mg/dL   Alkaline phosphatase (APISO) 56 37 - 153 U/L   AST 24 10 - 35 U/L   ALT 29 6 - 29 U/L  Lipid panel     Status: Abnormal   Collection Time: 10/24/23 11:21 AM  Result Value Ref Range   Cholesterol 146 <200 mg/dL   HDL 58 > OR = 50 mg/dL   Triglycerides 826 (H) <150 mg/dL   LDL Cholesterol (Calc) 63 mg/dL (calc)    Comment: Reference range: <100 . Desirable range <100 mg/dL for primary prevention;   <70 mg/dL for patients with CHD or diabetic patients  with > or = 2 CHD risk factors. SABRA LDL-C is now calculated using the Martin-Hopkins  calculation, which is a validated novel method providing  better accuracy than the Friedewald equation in the  estimation of LDL-C.  Gladis APPLETHWAITE et al. SANDREA. 7986;689(80): 2061-2068  (http://education.[facility].com/faq/FAQ164)    Total CHOL/HDL Ratio 2.5 <5.0 (calc)   Non-HDL Cholesterol (Calc) 88 <869 mg/dL (calc)    Comment: For patients with diabetes plus 1 major ASCVD risk  factor, treating to a non-HDL-C goal of <100 mg/dL  (LDL-C of <29 mg/dL) is considered a therapeutic  option.   Hemoglobin A1c     Status: Abnormal   Collection Time: 10/24/23 11:21 AM  Result Value Ref Range   Hgb A1c MFr Bld 6.6 (H) <5.7 %    Comment: For someone without known diabetes, a hemoglobin A1c value of 6.5% or greater indicates that they may have  diabetes and this should be confirmed with a follow-up  test. . For someone with known diabetes, a value <7% indicates  that their diabetes is well controlled and a value  greater than or equal to 7% indicates suboptimal  control. A1c targets should be individualized based on  duration of diabetes,  age, comorbid conditions, and  other considerations. . Currently, no consensus exists regarding use of hemoglobin A1c for diagnosis of diabetes for children. .    Mean Plasma Glucose 143 mg/dL   eAG (mmol/L) 7.9 mmol/L  QuantiFERON-TB Gold Plus     Status: None   Collection Time: 10/24/23 11:21 AM  Result Value Ref Range   QuantiFERON-TB Gold Plus NEGATIVE NEGATIVE    Comment: Negative test result. M. tuberculosis complex  infection unlikely.    NIL 0.04 IU/mL   Mitogen-NIL 8.37 IU/mL   TB1-NIL 0.00 IU/mL   TB2-NIL 0.00 IU/mL    Comment: . The Nil tube value reflects the background interferon gamma immune response of the patient's blood sample. This value has been subtracted from the patient's displayed TB and Mitogen results. . Lower than expected results with the Mitogen tube prevent false-negative Quantiferon readings by detecting a patient with a potential immune suppressive condition and/or suboptimal pre-analytical specimen handling. . The TB1 Antigen tube is coated with the M. tuberculosis-specific antigens designed to elicit responses from TB antigen primed CD4+ helper T-lymphocytes. . The TB2 Antigen tube is coated with the M. tuberculosis-specific antigens designed to elicit responses from TB antigen primed CD4+ helper and CD8+ cytotoxic T-lymphocytes. . For additional information, please refer to https://education.[facility].com/faq/FAQ204 (This link is being provided for informational/ educational purposes only.) .     Diabetic Foot Exam:     PHQ2/9:    01/15/2024   10:43 AM 10/24/2023   10:52 AM 06/04/2023   10:33 AM 01/19/2023   11:25 AM 10/10/2022   10:59 AM  Depression screen PHQ 2/9  Decreased Interest 0 0 0 0 2  Down, Depressed, Hopeless 0 0 0 0 2  PHQ - 2 Score 0 0 0 0 4  Altered sleeping  0 0 0 0  Tired, decreased energy  0 0 0 0  Change in appetite  0 0 0 0  Feeling bad or failure about yourself   0 1 0 0  Trouble concentrating   0 0 0 0  Moving slowly or fidgety/restless  0 0 0 0  Suicidal thoughts  0 0 0 0  PHQ-9 Score  0 1 0 4  Difficult doing work/chores   Not difficult at all Not difficult at all Somewhat difficult    phq 9 is negative  Fall Risk:    01/15/2024   10:43 AM 10/24/2023   10:48 AM 06/04/2023   10:15 AM 01/19/2023   11:25 AM 10/10/2022   10:59 AM  Fall Risk   Falls in the past year? 0 0 0 0 0  Number falls in past yr: 0 0 0 0 0  Injury with Fall? 0 0 0 0 0  Risk for fall due to : No Fall Risks No Fall Risks  No Fall Risks No Fall Risks  Follow up Falls evaluation completed Falls prevention discussed  Falls prevention discussed;Education provided;Falls evaluation completed Falls prevention discussed;Education provided;Falls evaluation completed      Assessment & Plan Polyarticular joint pain and swelling with nail changes (suspected autoimmune etiology) Polyarticular joint pain and swelling with nail pitting suggestive of autoimmune etiology, possibly rheumatoid or psoriatic arthritis. Previous prednisone  treatment for flare-up. Podiatrist recommended evaluation for autoimmune cause. - Order comprehensive autoimmune panel including tests for lupus and rheumatoid arthritis. -  Consider referral to rheumatologist if autoimmune panel is positive or symptoms persist. - Consider x-rays of hands and knees if symptoms continue.  Essential hypertension Blood pressure elevated in office, normal at home. Currently on lisinopril  HCTZ 10/12.5 mg and norvasc  10 mg daily  - Monitor blood pressure at home and report readings at follow-up. - Reassess blood pressure management at follow-up appointment.  Type 2 diabetes mellitus with complications such as obesity and HTN Due for diabetes management follow-up. Last A1c checked in June. Regular monitoring needed for glycemic control. - Schedule follow-up appointment for diabetes management after September 11.  Heart murmur Heart murmur noted, not previously  evaluated with echocardiogram. Etiology and significance unclear. - Consider echocardiogram to evaluate the heart murmur and determine if it is progressing.

## 2024-01-19 LAB — ANTI-NUCLEAR AB-TITER (ANA TITER): ANA Titer 1: 1:40 {titer} — ABNORMAL HIGH

## 2024-01-19 LAB — ANALYZER(R)ANA IFA WITH REFLEX TITER/PATTRN,SYS AUTOIMM PNL1
Anti Nuclear Antibody (ANA): POSITIVE — AB
Anticardiolipin IgA: <2 [APL'U]/mL
Anticardiolipin IgG: <2 [GPL'U]/mL
Anticardiolipin IgM: <2 [MPL'U]/mL
Beta-2 Glyco 1 IgA: <2 U/mL
Beta-2 Glyco 1 IgM: <2 U/mL
Beta-2 Glyco I IgG: <2 U/mL
C3 Complement: 183 mg/dL (ref 83–193)
C4 Complement: 33 mg/dL (ref 15–57)
Centromere Ab Screen: <1 AI
Chromatin (Nucleosomal) Antibody: <1 AI
Cyclic Citrullin Peptide Ab: <16 U
DNA Ab (DS) Crithidia, IFA: NEGATIVE
ENA SM Ab Ser-aCnc: <1 AI
Jo-1 Autoabs: <1 AI
MUTATED CITRULLINATED VIMENTIN (MCV) AB: <20 U/mL (ref <20)
Rheumatoid Factor (IgA): <5 U
Rheumatoid Factor (IgG): <5 U
Rheumatoid Factor (IgM): <5 U
Ribonucleic Protein(ENA) Antibody, IgG: 1.5 AI — AB
SM/RNP: <1 AI
SSA (Ro) (ENA) Antibody, IgG: <1 AI
SSB (La) (ENA) Antibody, IgG: <1 AI
Scleroderma (Scl-70) (ENA) Antibody, IgG: <1 AI
Thyroperoxidase Ab SerPl-aCnc: <1 [IU]/mL (ref <9)

## 2024-01-21 ENCOUNTER — Other Ambulatory Visit: Payer: Self-pay | Admitting: Family Medicine

## 2024-01-21 ENCOUNTER — Ambulatory Visit: Payer: Self-pay | Admitting: Family Medicine

## 2024-01-21 DIAGNOSIS — M2550 Pain in unspecified joint: Secondary | ICD-10-CM

## 2024-01-21 DIAGNOSIS — R768 Other specified abnormal immunological findings in serum: Secondary | ICD-10-CM

## 2024-01-21 DIAGNOSIS — L608 Other nail disorders: Secondary | ICD-10-CM

## 2024-01-22 DIAGNOSIS — R32 Unspecified urinary incontinence: Secondary | ICD-10-CM | POA: Diagnosis not present

## 2024-01-25 DIAGNOSIS — Z419 Encounter for procedure for purposes other than remedying health state, unspecified: Secondary | ICD-10-CM | POA: Diagnosis not present

## 2024-01-28 ENCOUNTER — Other Ambulatory Visit: Payer: Self-pay | Admitting: Internal Medicine

## 2024-01-28 DIAGNOSIS — E1165 Type 2 diabetes mellitus with hyperglycemia: Secondary | ICD-10-CM

## 2024-01-29 NOTE — Telephone Encounter (Signed)
 Requested Prescriptions  Pending Prescriptions Disp Refills   metFORMIN  (GLUCOPHAGE -XR) 500 MG 24 hr tablet [Pharmacy Med Name: METFORMIN  HCL ER 500 MG TAB] 270 tablet 0    Sig: TAKE 3 TABLETS BY MOUTH DAILY WITH BREAKFAST     Endocrinology:  Diabetes - Biguanides Failed - 01/29/2024  3:28 PM      Failed - B12 Level in normal range and within 720 days    No results found for: VITAMINB12       Passed - Cr in normal range and within 360 days    Creatinine  Date Value Ref Range Status  02/24/2015 0.9 0.6 - 1.1 mg/dL Final   Creat  Date Value Ref Range Status  10/24/2023 0.90 0.50 - 1.03 mg/dL Final   Creatinine, Urine  Date Value Ref Range Status  06/04/2023 132 20 - 275 mg/dL Final         Passed - HBA1C is between 0 and 7.9 and within 180 days    Hgb A1c MFr Bld  Date Value Ref Range Status  10/24/2023 6.6 (H) <5.7 % Final    Comment:    For someone without known diabetes, a hemoglobin A1c value of 6.5% or greater indicates that they may have  diabetes and this should be confirmed with a follow-up  test. . For someone with known diabetes, a value <7% indicates  that their diabetes is well controlled and a value  greater than or equal to 7% indicates suboptimal  control. A1c targets should be individualized based on  duration of diabetes, age, comorbid conditions, and  other considerations. . Currently, no consensus exists regarding use of hemoglobin A1c for diagnosis of diabetes for children. .          Passed - eGFR in normal range and within 360 days    GFR, Est African American  Date Value Ref Range Status  09/28/2020 77 > OR = 60 mL/min/1.27m2 Final   GFR, Est Non African American  Date Value Ref Range Status  09/28/2020 66 > OR = 60 mL/min/1.48m2 Final   eGFR  Date Value Ref Range Status  10/24/2023 77 > OR = 60 mL/min/1.36m2 Final         Passed - Valid encounter within last 6 months    Recent Outpatient Visits           2 weeks ago Arthralgia,  unspecified joint   [facility] Glenard Mire, MD   3 months ago Annual physical exam   [facility] Gareth Mliss FALCON, FNP       Future Appointments             In 2 weeks Leavy Mole, PA-C [facility], [place]   In 5 months McGowan, Clotilda DELENA RIGGERS [facility] [place]            Passed - CBC within normal limits and completed in the last 12 months    WBC  Date Value Ref Range Status  10/24/2023 6.4 3.8 - 10.8 Thousand/uL Final   RBC  Date Value Ref Range Status  10/24/2023 5.01 3.80 - 5.10 Million/uL Final   Hemoglobin  Date Value Ref Range Status  10/24/2023 13.1 11.7 - 15.5 g/dL Final   HGB  Date Value Ref Range Status  02/24/2015 12.8 11.6 - 15.9 g/dL Final   HCT  Date Value Ref Range Status  10/24/2023 42.9 35.0 - 45.0 % Final  02/24/2015 40.1 34.8 -  46.6 % Final   MCHC  Date Value Ref Range Status  10/24/2023 30.5 (L) 32.0 - 36.0 g/dL Final    Comment:    For adults, a slight decrease in the calculated MCHC value (in the range of 30 to 32 g/dL) is most likely not clinically significant; however, it should be interpreted with caution in correlation with other red cell parameters and the patient's clinical condition.    [facility]  Date Value Ref Range Status  10/24/2023 26.1 (L) 27.0 - 33.0 pg Final   MCV  Date Value Ref Range Status  10/24/2023 85.6 80.0 - 100.0 fL Final  02/24/2015 85.0 79.5 - 101.0 fL Final   No results found for: PLTCOUNTKUC, LABPLAT, POCPLA RDW  Date Value Ref Range Status  10/24/2023 14.3 11.0 - 15.0 % Final  02/24/2015 15.0 (H) 11.2 - 14.5 % Final

## 2024-02-04 ENCOUNTER — Other Ambulatory Visit: Payer: Self-pay | Admitting: Family Medicine

## 2024-02-04 DIAGNOSIS — I10 Essential (primary) hypertension: Secondary | ICD-10-CM

## 2024-02-05 NOTE — Telephone Encounter (Signed)
 Requested Prescriptions  Pending Prescriptions Disp Refills   amLODipine  (NORVASC ) 10 MG tablet [Pharmacy Med Name: AMLODIPINE  BESYLATE 10 MG TAB] 90 tablet 0    Sig: TAKE ONE TABLET BY MOUTH ONCE DAILY     Cardiovascular: Calcium  Channel Blockers 2 Passed - 02/05/2024 11:14 AM      Passed - Last BP in normal range    BP Readings from Last 1 Encounters:  01/15/24 122/72         Passed - Last Heart Rate in normal range    Pulse Readings from Last 1 Encounters:  01/15/24 98         Passed - Valid encounter within last 6 months    Recent Outpatient Visits           3 weeks ago Arthralgia, unspecified joint   [facility] Glenard Mire, MD   3 months ago Annual physical exam   [facility] Gareth Mliss FALCON, FNP       Future Appointments             In 1 week Leavy Mole, PA-C [facility], [place]   In 5 months McGowan, Clotilda DELENA RIGGERS [facility] [place]

## 2024-02-06 ENCOUNTER — Other Ambulatory Visit: Payer: Self-pay | Admitting: Podiatry

## 2024-02-11 ENCOUNTER — Other Ambulatory Visit: Payer: Self-pay | Admitting: Family Medicine

## 2024-02-11 DIAGNOSIS — I10 Essential (primary) hypertension: Secondary | ICD-10-CM

## 2024-02-14 ENCOUNTER — Ambulatory Visit: Admitting: Family Medicine

## 2024-02-24 ENCOUNTER — Encounter: Payer: Self-pay | Admitting: Nurse Practitioner

## 2024-02-24 DIAGNOSIS — I10 Essential (primary) hypertension: Secondary | ICD-10-CM

## 2024-02-25 MED ORDER — LISINOPRIL-HYDROCHLOROTHIAZIDE 10-12.5 MG PO TABS: 1.0000 | ORAL_TABLET | Freq: Every day | ORAL | 1 refills | Status: DC

## 2024-03-12 ENCOUNTER — Other Ambulatory Visit: Payer: Self-pay | Admitting: Urology

## 2024-03-12 DIAGNOSIS — M76899 Other specified enthesopathies of unspecified lower limb, excluding foot: Secondary | ICD-10-CM | POA: Diagnosis not present

## 2024-03-12 DIAGNOSIS — M7661 Achilles tendinitis, right leg: Secondary | ICD-10-CM | POA: Diagnosis not present

## 2024-03-12 DIAGNOSIS — M2560 Stiffness of unspecified joint, not elsewhere classified: Secondary | ICD-10-CM | POA: Diagnosis not present

## 2024-03-12 DIAGNOSIS — M2550 Pain in unspecified joint: Secondary | ICD-10-CM | POA: Diagnosis not present

## 2024-03-26 DIAGNOSIS — Z419 Encounter for procedure for purposes other than remedying health state, unspecified: Secondary | ICD-10-CM | POA: Diagnosis not present

## 2024-04-14 ENCOUNTER — Telehealth: Payer: Self-pay

## 2024-04-14 ENCOUNTER — Encounter: Payer: Self-pay | Admitting: Nurse Practitioner

## 2024-04-14 NOTE — Telephone Encounter (Signed)
 Refill request on   sertraline  (ZOLOFT ) 100 MG tablet

## 2024-04-14 NOTE — Telephone Encounter (Signed)
 Should have enough until jan. Needs appt

## 2024-04-16 ENCOUNTER — Other Ambulatory Visit: Payer: Self-pay | Admitting: Urology

## 2024-04-16 DIAGNOSIS — R3915 Urgency of urination: Secondary | ICD-10-CM

## 2024-04-16 MED ORDER — MIRABEGRON ER 50 MG PO TB24: 50.0000 mg | ORAL_TABLET | Freq: Every day | ORAL | 11 refills | Status: AC

## 2024-04-28 ENCOUNTER — Other Ambulatory Visit: Payer: Self-pay

## 2024-04-28 DIAGNOSIS — F320 Major depressive disorder, single episode, mild: Secondary | ICD-10-CM

## 2024-04-28 MED ORDER — SERTRALINE HCL 100 MG PO TABS: 100.0000 mg | ORAL_TABLET | Freq: Every day | ORAL | 0 refills | Status: DC

## 2024-04-28 NOTE — Telephone Encounter (Signed)
 [facility]

## 2024-04-29 DIAGNOSIS — R252 Cramp and spasm: Secondary | ICD-10-CM | POA: Diagnosis not present

## 2024-04-29 DIAGNOSIS — L4050 Arthropathic psoriasis, unspecified: Secondary | ICD-10-CM | POA: Diagnosis not present

## 2024-04-29 DIAGNOSIS — Z79899 Other long term (current) drug therapy: Secondary | ICD-10-CM | POA: Diagnosis not present

## 2024-05-03 ENCOUNTER — Other Ambulatory Visit: Payer: Self-pay | Admitting: Internal Medicine

## 2024-05-03 DIAGNOSIS — E1165 Type 2 diabetes mellitus with hyperglycemia: Secondary | ICD-10-CM

## 2024-05-07 NOTE — Telephone Encounter (Signed)
 Requested Prescriptions  Pending Prescriptions Disp Refills   metFORMIN  (GLUCOPHAGE -XR) 500 MG 24 hr tablet [Pharmacy Med Name: METFORMIN  HCL ER 500 MG TAB] 270 tablet 0    Sig: TAKE 3 TABLETS BY MOUTH DAILY WITH BREAKFAST     Endocrinology:  Diabetes - Biguanides Failed - 05/07/2024  9:34 AM      Failed - HBA1C is between 0 and 7.9 and within 180 days    Hgb A1c MFr Bld  Date Value Ref Range Status  10/24/2023 6.6 (H) <5.7 % Final    Comment:    For someone without known diabetes, a hemoglobin A1c value of 6.5% or greater indicates that they may have  diabetes and this should be confirmed with a follow-up  test. . For someone with known diabetes, a value <7% indicates  that their diabetes is well controlled and a value  greater than or equal to 7% indicates suboptimal  control. A1c targets should be individualized based on  duration of diabetes, age, comorbid conditions, and  other considerations. . Currently, no consensus exists regarding use of hemoglobin A1c for diagnosis of diabetes for children. .          Failed - B12 Level in normal range and within 720 days    No results found for: VITAMINB12       Passed - Cr in normal range and within 360 days    Creatinine  Date Value Ref Range Status  02/24/2015 0.9 0.6 - 1.1 mg/dL Final   Creat  Date Value Ref Range Status  10/24/2023 0.90 0.50 - 1.03 mg/dL Final   Creatinine, Urine  Date Value Ref Range Status  06/04/2023 132 20 - 275 mg/dL Final         Passed - eGFR in normal range and within 360 days    GFR, Est African American  Date Value Ref Range Status  09/28/2020 77 > OR = 60 mL/min/1.93m2 Final   GFR, Est Non African American  Date Value Ref Range Status  09/28/2020 66 > OR = 60 mL/min/1.51m2 Final   eGFR  Date Value Ref Range Status  10/24/2023 77 > OR = 60 mL/min/1.83m2 Final         Passed - Valid encounter within last 6 months    Recent Outpatient Visits           3 months ago Arthralgia,  unspecified joint   [facility] Glenard Mire, MD   6 months ago Annual physical exam   [facility] Gareth Mliss FALCON, FNP       Future Appointments             In 2 months McGowan, Clotilda LABOR, PA-C [facility] [place]            Passed - CBC within normal limits and completed in the last 12 months    WBC  Date Value Ref Range Status  10/24/2023 6.4 3.8 - 10.8 Thousand/uL Final   RBC  Date Value Ref Range Status  10/24/2023 5.01 3.80 - 5.10 Million/uL Final   Hemoglobin  Date Value Ref Range Status  10/24/2023 13.1 11.7 - 15.5 g/dL Final   HGB  Date Value Ref Range Status  02/24/2015 12.8 11.6 - 15.9 g/dL Final   HCT  Date Value Ref Range Status  10/24/2023 42.9 35.0 - 45.0 % Final  02/24/2015 40.1 34.8 - 46.6 % Final   MCHC  Date Value Ref Range Status  10/24/2023  30.5 (L) 32.0 - 36.0 g/dL Final    Comment:    For adults, a slight decrease in the calculated MCHC value (in the range of 30 to 32 g/dL) is most likely not clinically significant; however, it should be interpreted with caution in correlation with other red cell parameters and the patient's clinical condition.    [facility]  Date Value Ref Range Status  10/24/2023 26.1 (L) 27.0 - 33.0 pg Final   MCV  Date Value Ref Range Status  10/24/2023 85.6 80.0 - 100.0 fL Final  02/24/2015 85.0 79.5 - 101.0 fL Final   No results found for: PLTCOUNTKUC, LABPLAT, POCPLA RDW  Date Value Ref Range Status  10/24/2023 14.3 11.0 - 15.0 % Final  02/24/2015 15.0 (H) 11.2 - 14.5 % Final

## 2024-05-16 ENCOUNTER — Ambulatory Visit: Admitting: Podiatry

## 2024-05-16 DIAGNOSIS — M19072 Primary osteoarthritis, left ankle and foot: Secondary | ICD-10-CM | POA: Diagnosis not present

## 2024-05-16 MED ORDER — BETAMETHASONE SOD PHOS & ACET 6 (3-3) MG/ML IJ SUSP
3.0000 mg | Freq: Once | INTRAMUSCULAR | Status: AC
  Administered 2024-05-16: 3 mg via INTRA_ARTICULAR

## 2024-05-16 NOTE — Progress Notes (Signed)
" ° °  Chief Complaint  Patient presents with   Foot Pain    Shot in left foot. She states her arthritis has flared up again.     HPI: Presenting today for follow-up evaluation of left midfoot pain.  Recently diagnosed with psoriasis and is being managed by rheumatology.  Past Medical History:  Diagnosis Date   Anxiety    Arthritis    Breast cancer (HCC) 2016   Depression    GERD (gastroesophageal reflux disease)    Hot flashes    Hypertension    OSA on CPAP 11/03/2016   Personal history of radiation therapy    Sleep apnea    Past Surgical History:  Procedure Laterality Date   ABDOMINAL HYSTERECTOMY  06/15/2017   BREAST EXCISIONAL BIOPSY Left 03/18/2015   breast ca +   BREAST LUMPECTOMY WITH RADIOACTIVE SEED LOCALIZATION Left 03/18/2015   Procedure: BREAST LUMPECTOMY WITH RADIOACTIVE SEED LOCALIZATION;  Surgeon: Debby Shipper, MD;  Location: [facility];  Service: General;  Laterality: Left;   BREAST SURGERY     CARPAL TUNNEL RELEASE Bilateral 04/16/2020   CERVICAL BIOPSY  W/ LOOP ELECTRODE EXCISION     CHOLECYSTECTOMY     HERNIA REPAIR     MASTECTOMY, PARTIAL Left 02/15/2015   NASAL SEPTUM SURGERY     TONSILLECTOMY AND ADENOIDECTOMY     UVULECTOMY     No Known Allergies  Physical Exam: General: The patient is alert and oriented x3 in no acute distress.  Dermatology: Skin is warm, dry and supple bilateral lower extremities.   Vascular: Palpable pedal pulses bilaterally. No edema or erythema noted. Capillary refill within normal limits.  Neurological: Grossly intact via light touch  Musculoskeletal Exam: No pedal deformity.  Tenderness with palpation to the midtarsal joint of the left foot  Radiographic exam LT foot 09/27/2023: No acute fracture identified.  Joint spaces mostly preserved with exception of some degenerative changes noted through the midtarsal joint left foot  Assessment: 1.  Arthritis left foot; TMT 2.  Polyarticular psoriasis  Plan  of Care:  -Patient evaluated.  -Injection of 0.5 cc Celestone  Soluspan injected along the dorsal midfoot left - Continue management with rheumatology for psoriasis -Return to clinic PRN  *Daycare teacher standing all day.     Thresa EMERSON Sar, DPM [facility]  Dr. Thresa EMERSON Sar, DPM    [address] [place], [place] [address]                Office [phone]  Fax [phone]    "

## 2024-05-19 ENCOUNTER — Telehealth: Payer: Self-pay | Admitting: Family Medicine

## 2024-05-19 DIAGNOSIS — I10 Essential (primary) hypertension: Secondary | ICD-10-CM

## 2024-05-19 MED ORDER — AMLODIPINE BESYLATE 10 MG PO TABS: 10.0000 mg | ORAL_TABLET | Freq: Every day | ORAL | 0 refills | Status: DC

## 2024-05-19 NOTE — Telephone Encounter (Signed)
 Tried to call left vm pt needs to call back to schedule f/u

## 2024-05-19 NOTE — Telephone Encounter (Signed)
amLODipine (NORVASC) 10 MG tablet  

## 2024-05-20 ENCOUNTER — Encounter: Payer: Self-pay | Admitting: Urology

## 2024-05-30 ENCOUNTER — Telehealth: Payer: Self-pay | Admitting: Pharmacy Technician

## 2024-05-30 ENCOUNTER — Other Ambulatory Visit (HOSPITAL_COMMUNITY): Payer: Self-pay

## 2024-05-30 NOTE — Telephone Encounter (Signed)
 Pharmacy Patient Advocate Encounter   Received notification from CoverMyMeds that prior authorization for Ozempic  (0.25 or 0.5 MG/DOSE) 2MG /3ML pen-injectors is due for renewal.   Insurance verification completed.   The patient is insured through [facility] MEDICAID.  Action: Medication is now available without a prior authorization. Archived Key: [id number]

## 2024-06-02 ENCOUNTER — Other Ambulatory Visit (HOSPITAL_COMMUNITY): Payer: Self-pay

## 2024-06-02 ENCOUNTER — Telehealth: Payer: Self-pay | Admitting: Pharmacy Technician

## 2024-06-02 ENCOUNTER — Other Ambulatory Visit: Payer: Self-pay

## 2024-06-02 ENCOUNTER — Encounter: Payer: Self-pay | Admitting: Internal Medicine

## 2024-06-02 ENCOUNTER — Ambulatory Visit: Admitting: Internal Medicine

## 2024-06-02 ENCOUNTER — Telehealth: Payer: Self-pay | Admitting: Internal Medicine

## 2024-06-02 VITALS — BP 124/72 | HR 80 | Temp 98.0°F | Resp 16 | Ht 69.0 in | Wt 350.2 lb

## 2024-06-02 DIAGNOSIS — E1165 Type 2 diabetes mellitus with hyperglycemia: Secondary | ICD-10-CM

## 2024-06-02 DIAGNOSIS — E782 Mixed hyperlipidemia: Secondary | ICD-10-CM

## 2024-06-02 DIAGNOSIS — L4050 Arthropathic psoriasis, unspecified: Secondary | ICD-10-CM

## 2024-06-02 DIAGNOSIS — I10 Essential (primary) hypertension: Secondary | ICD-10-CM

## 2024-06-02 DIAGNOSIS — Z7984 Long term (current) use of oral hypoglycemic drugs: Secondary | ICD-10-CM

## 2024-06-02 DIAGNOSIS — K219 Gastro-esophageal reflux disease without esophagitis: Secondary | ICD-10-CM

## 2024-06-02 DIAGNOSIS — F330 Major depressive disorder, recurrent, mild: Secondary | ICD-10-CM

## 2024-06-02 DIAGNOSIS — N3281 Overactive bladder: Secondary | ICD-10-CM

## 2024-06-02 DIAGNOSIS — Z23 Encounter for immunization: Secondary | ICD-10-CM

## 2024-06-02 DIAGNOSIS — Z6841 Body Mass Index (BMI) 40.0 and over, adult: Secondary | ICD-10-CM

## 2024-06-02 DIAGNOSIS — Z1211 Encounter for screening for malignant neoplasm of colon: Secondary | ICD-10-CM

## 2024-06-02 DIAGNOSIS — Z1231 Encounter for screening mammogram for malignant neoplasm of breast: Secondary | ICD-10-CM

## 2024-06-02 LAB — POCT GLYCOSYLATED HEMOGLOBIN (HGB A1C): Hemoglobin A1C: 8.8 % — AB (ref 4.0–5.6)

## 2024-06-02 MED ORDER — AMLODIPINE BESYLATE 10 MG PO TABS: 10.0000 mg | ORAL_TABLET | Freq: Every day | ORAL | 1 refills | Status: AC

## 2024-06-02 MED ORDER — LISINOPRIL-HYDROCHLOROTHIAZIDE 10-12.5 MG PO TABS: 1.0000 | ORAL_TABLET | Freq: Every day | ORAL | 1 refills | Status: AC

## 2024-06-02 MED ORDER — ROSUVASTATIN CALCIUM 5 MG PO TABS: 5.0000 mg | ORAL_TABLET | Freq: Every day | ORAL | 1 refills | Status: AC

## 2024-06-02 MED ORDER — METFORMIN HCL ER 500 MG PO TB24: 500.0000 mg | ORAL_TABLET | Freq: Two times a day (BID) | ORAL | 1 refills | Status: DC

## 2024-06-02 MED ORDER — TIRZEPATIDE 2.5 MG/0.5ML ~~LOC~~ SOAJ: 2.5000 mg | SUBCUTANEOUS | 0 refills | Status: DC

## 2024-06-02 MED ORDER — SERTRALINE HCL 100 MG PO TABS: 100.0000 mg | ORAL_TABLET | Freq: Every day | ORAL | 1 refills | Status: AC

## 2024-06-02 NOTE — Telephone Encounter (Signed)
 Pharmacy Patient Advocate Encounter   Received notification from CoverMyMeds that prior authorization for Mounjaro  2.5MG /0.5ML auto-injectors is required/requested.   Insurance verification completed.   The patient is insured through [facility] MEDICAID.   Per test claim: PA required; PA started via CoverMyMeds. KEY [id number] . Waiting for clinical questions to populate.

## 2024-06-02 NOTE — Telephone Encounter (Unsigned)
 Copied from CRM [id number]. Topic: Clinical - Prescription Issue >> Jun 02, 2024 12:59 PM Fonda T wrote: Reason for CRM: Marina  calling from [facility], with questions regarding dosage of prescription sent to pharmacy. Medication:  metFORMIN  (GLUCOPHAGE -XR) 500 MG 24 hr tablet   Also for medication Mounjaro , States insurance prefers Trulicity, Ozempic , or Victoza. Requesting preferred medication.   Requesting a follow up call to [phone]  Aware of same day follow up call.    [facility] - [place], [place] - [address] [place] [address] Phone: [phone] Fax: [phone]

## 2024-06-02 NOTE — Progress Notes (Signed)
 "  Established Patient Office Visit  Subjective   Patient ID: Elizabeth Hoffman, female    DOB: 01-26-1971  Age: 54 y.o. MRN: [id number]  Chief Complaint  Patient presents with   Medical Management of Chronic Issues    Medication refills    HPI  Patient is here for follow up on chronic medical conditions. She was a [facility] patient and this is my first time meeting her.   Hypertension: -Medications: Amlodipine  10 mg, Lisinopril -hydrochlorothiazide  10-12.5 mg -Patient is compliant with above medications and reports no side effects. -Denies any SOB, CP, vision changes, LE edema or symptoms of hypotension  HLD: -Medications: Crestor  5 mg -Patient is compliant with above medications and reports no side effects.  -Last lipid panel: Lipid Panel     Component Value Date/Time   CHOL 146 10/24/2023 1121   CHOL 202 (H) 02/04/2015 0905   TRIG 173 (H) 10/24/2023 1121   HDL 58 10/24/2023 1121   HDL 55 02/04/2015 0905   CHOLHDL 2.5 10/24/2023 1121   VLDL 33 (H) 11/30/2015 1123   LDLCALC 63 10/24/2023 1121    Diabetes, Type 2: -Last A1c 6.6% 6/25 -Medications: Metformin  500 mg XR TID (taking 1500 at once), Ozempic  1 mg weekly but due to insurance issues is not taking right now -Patient is compliant with the above medications and reports no side effects.  -Eye exam: Due -Foot exam: UTD -Microalbumin: Due -Statin: yes -PNA vaccine: UTD -Denies symptoms of hypoglycemia, polyuria, polydipsia, numbness extremities, foot ulcers/trauma.   MDD: -Mood status: stable -Current treatment: Zoloft  100 mg -Satisfied with current treatment?: yes  -Duration of current treatment : years -Side effects: no Medication compliance: excellent compliance     06/02/2024   11:01 AM 01/15/2024   10:43 AM 10/24/2023   10:52 AM 06/04/2023   10:33 AM 01/19/2023   11:25 AM  Depression screen PHQ 2/9  Decreased Interest 0 0 0 0 0  Down, Depressed, Hopeless 0 0 0 0 0  PHQ - 2 Score 0 0 0 0 0  Altered sleeping    0 0 0  Tired, decreased energy   0 0 0  Change in appetite   0 0 0  Feeling bad or failure about yourself    0 1 0  Trouble concentrating   0 0 0  Moving slowly or fidgety/restless   0 0 0  Suicidal thoughts   0 0 0  PHQ-9 Score   0  1  0   Difficult doing work/chores    Not difficult at all Not difficult at all     Data saved with a previous flowsheet row definition   GERD: -Currently on Protonix  20 mg  OAB: -Following with Urology -Currently on Myrbetriq  50 mg   Psoriatic Arthritis: -Following with Rheumatology  -About to start Cosentyx injections   Health Maintenance: -Blood work UTD -Mammogram 1/25 due  -Colon cancer screening: Cologuard ordered  -Flu vaccine today    Review of Systems  Gastrointestinal:  Positive for diarrhea.  All other systems reviewed and are negative.     Objective:     BP 124/72 (Cuff Size: Large)   Pulse 80   Temp 98 F (36.7 C) (Oral)   Resp 16   Ht 5' 9 (1.753 m)   Wt (!) 350 lb 3.2 oz (158.8 kg)   LMP 02/18/2015   SpO2 97%   BMI 51.72 kg/m  BP Readings from Last 3 Encounters:  06/02/24 124/72  01/15/24 122/72  10/24/23 120/82  Wt Readings from Last 3 Encounters:  06/02/24 (!) 350 lb 3.2 oz (158.8 kg)  01/15/24 (!) 349 lb 9.6 oz (158.6 kg)  12/21/23 (!) 342 lb (155.1 kg)      Physical Exam Constitutional:      Appearance: Normal appearance.  HENT:     Head: Normocephalic and atraumatic.  Eyes:     Conjunctiva/sclera: Conjunctivae normal.  Cardiovascular:     Rate and Rhythm: Normal rate and regular rhythm.  Pulmonary:     Effort: Pulmonary effort is normal.     Breath sounds: Normal breath sounds.  Skin:    General: Skin is warm and dry.  Neurological:     General: No focal deficit present.     Mental Status: She is alert. Mental status is at baseline.  Psychiatric:        Mood and Affect: Mood normal.        Behavior: Behavior normal.      No results found for any visits on 06/02/24.  Last  CBC Lab Results  Component Value Date   WBC 6.4 10/24/2023   HGB 13.1 10/24/2023   HCT 42.9 10/24/2023   MCV 85.6 10/24/2023   MCH 26.1 (L) 10/24/2023   RDW 14.3 10/24/2023   PLT 247 10/24/2023   Last metabolic panel Lab Results  Component Value Date   GLUCOSE 115 (H) 10/24/2023   NA 142 10/24/2023   K 4.8 10/24/2023   CL 106 10/24/2023   CO2 28 10/24/2023   BUN 14 10/24/2023   CREATININE 0.90 10/24/2023   EGFR 77 10/24/2023   CALCIUM  9.3 10/24/2023   PROT 7.0 10/24/2023   ALBUMIN 3.8 11/30/2015   LABGLOB 3.2 02/04/2015   AGRATIO 1.3 02/04/2015   BILITOT 0.4 10/24/2023   ALKPHOS 53 11/30/2015   AST 24 10/24/2023   ALT 29 10/24/2023   ANIONGAP 10 03/15/2015   Last lipids Lab Results  Component Value Date   CHOL 146 10/24/2023   HDL 58 10/24/2023   LDLCALC 63 10/24/2023   TRIG 173 (H) 10/24/2023   CHOLHDL 2.5 10/24/2023   Last hemoglobin A1c Lab Results  Component Value Date   HGBA1C 6.6 (H) 10/24/2023   Last thyroid  functions Lab Results  Component Value Date   TSH 4.60 (H) 11/30/2015   THYROIDAB <1 01/15/2024   Last vitamin D No results found for: 25OHVITD2, 25OHVITD3, VD25OH Last vitamin B12 and Folate No results found for: VITAMINB12, FOLATE    The 10-year ASCVD risk score (Arnett DK, et al., 2019) is: 2.3%    Assessment & Plan:   Assessment & Plan Type 2 diabetes mellitus with hyperglycemia A1c increased to 8.8, indicating poor glycemic control. Inconsistent use of Ozempic  due to pharmacy issues and dosing challenges. Metformin  may cause gastrointestinal side effects. - Prescribed Mounjaro  2.5 mg once weekly with prior authorization. - Instructed to monitor for side effects such as queasiness, acid reflux, and constipation. - Advised to continue pantoprazole  20 mg for acid reflux. - Reduced metformin  to 500 mg twice daily. - Ordered urine microalbumin test today. - Scheduled follow-up in one month to assess response to  Mounjaro .  Primary hypertension Blood pressure well-controlled at 124/72 mmHg on current regimen. Lisinopril  provides renal protection, important due to diabetes. Hydrochlorothiazide  may affect kidney function, but current GFR is normal at 77. - Continue current antihypertensive regimen. - Refilled amlodipine , lisinopril , and hydrochlorothiazide  prescriptions.  Mixed hyperlipidemia Cholesterol levels well-controlled with rosuvastatin . LDL decreased to 63, triglycerides slightly elevated due to diabetes, and HDL  is high, which is protective. - Will recheck lipid panel in the summer.  Major depressive disorder, mild episode Currently managed with sertraline  100 mg daily. Reports medication is effective in managing symptoms. - Continue sertraline  100 mg daily. - Refilled sertraline  prescription.  Psoriatic Arthritis -Following with Rheumatology, about to start new medication.   Colon cancer screening Due for colon cancer screening. - Ordered Cologuard kit for colon cancer screening.  Breast cancer screening Due for mammogram. Last mammogram was on January 23rd, 2025. - Ordered mammogram and instructed patient to schedule appointment.  Immunization management Up to date on pneumonia and tetanus vaccinations. Received flu vaccine today. - Continue routine immunization schedule.  - POCT HgB A1C - rosuvastatin  (CRESTOR ) 5 MG tablet; Take 1 tablet (5 mg total) by mouth daily.  Dispense: 90 tablet; Refill: 1 - metFORMIN  (GLUCOPHAGE -XR) 500 MG 24 hr tablet; Take 1 tablet (500 mg total) by mouth 2 (two) times daily with a meal. TAKE 3 TABLETS BY MOUTH DAILY WITH BREAKFAST  Dispense: 180 tablet; Refill: 1 - Urine Microalbumin w/creat. ratio - tirzepatide  (MOUNJARO ) 2.5 MG/0.5ML Pen; Inject 2.5 mg into the skin once a week.  Dispense: 2 mL; Refill: 0 - amLODipine  (NORVASC ) 10 MG tablet; Take 1 tablet (10 mg total) by mouth daily.  Dispense: 90 tablet; Refill: 1 -  lisinopril -hydrochlorothiazide  (ZESTORETIC ) 10-12.5 MG tablet; Take 1 tablet by mouth daily.  Dispense: 90 tablet; Refill: 1 - rosuvastatin  (CRESTOR ) 5 MG tablet; Take 1 tablet (5 mg total) by mouth daily.  Dispense: 90 tablet; Refill: 1 - sertraline  (ZOLOFT ) 100 MG tablet; Take 1 tablet (100 mg total) by mouth daily.  Dispense: 90 tablet; Refill: 1 - Cologuard - MM 3D SCREENING MAMMOGRAM BILATERAL BREAST; Future - Flu vaccine trivalent PF, 6mos and older(Flulaval,Afluria,Fluarix,Fluzone)   Return in about 4 weeks (around 06/30/2024).    Sharyle Fischer, DO "

## 2024-06-03 ENCOUNTER — Other Ambulatory Visit (HOSPITAL_COMMUNITY): Payer: Self-pay

## 2024-06-03 ENCOUNTER — Ambulatory Visit: Payer: Self-pay | Admitting: Internal Medicine

## 2024-06-03 ENCOUNTER — Other Ambulatory Visit: Payer: Self-pay | Admitting: Internal Medicine

## 2024-06-03 DIAGNOSIS — E1165 Type 2 diabetes mellitus with hyperglycemia: Secondary | ICD-10-CM

## 2024-06-03 LAB — MICROALBUMIN / CREATININE URINE RATIO
Creatinine, Urine: 179 mg/dL (ref 20–275)
Microalb Creat Ratio: 7 mg/g{creat}
Microalb, Ur: 1.2 mg/dL

## 2024-06-03 MED ORDER — METFORMIN HCL ER 500 MG PO TB24: 500.0000 mg | ORAL_TABLET | Freq: Two times a day (BID) | ORAL | 1 refills | Status: AC

## 2024-06-03 NOTE — Telephone Encounter (Signed)
 Pharmacy Patient Advocate Encounter  Received notification from [facility] MEDICAID that Prior Authorization for Mounjaro  2.5MG /0.5ML auto-injectors has been DENIED.  Full denial letter will be uploaded to the media tab. See denial reason below.    PA #/Case ID/Reference #: [id number]

## 2024-06-05 ENCOUNTER — Other Ambulatory Visit (HOSPITAL_COMMUNITY): Payer: Self-pay

## 2024-06-20 ENCOUNTER — Other Ambulatory Visit: Payer: Self-pay | Admitting: Internal Medicine

## 2024-06-20 ENCOUNTER — Encounter: Payer: Self-pay | Admitting: Internal Medicine

## 2024-06-20 DIAGNOSIS — E1165 Type 2 diabetes mellitus with hyperglycemia: Secondary | ICD-10-CM

## 2024-06-20 MED ORDER — SEMAGLUTIDE(0.25 OR 0.5MG/DOS) 2 MG/3ML ~~LOC~~ SOPN: 0.2500 mg | PEN_INJECTOR | SUBCUTANEOUS | 0 refills | Status: AC

## 2024-06-30 ENCOUNTER — Ambulatory Visit: Admitting: Internal Medicine

## 2024-07-08 ENCOUNTER — Ambulatory Visit: Payer: Medicaid Other | Admitting: Podiatry

## 2024-07-14 ENCOUNTER — Ambulatory Visit: Payer: Medicaid Other | Admitting: Urology
